# Patient Record
Sex: Female | Born: 1955 | Race: Black or African American | Hispanic: No | Marital: Single | Smoking: Current some day smoker
Health system: Southern US, Community
[De-identification: ages and names within clinical notes are randomized; demographics above are authoritative.]

## PROBLEM LIST (undated history)

## (undated) DIAGNOSIS — M1990 Unspecified osteoarthritis, unspecified site: Secondary | ICD-10-CM

## (undated) DIAGNOSIS — I639 Cerebral infarction, unspecified: Secondary | ICD-10-CM

## (undated) DIAGNOSIS — I10 Essential (primary) hypertension: Secondary | ICD-10-CM

## (undated) DIAGNOSIS — F32A Depression, unspecified: Secondary | ICD-10-CM

## (undated) DIAGNOSIS — E785 Hyperlipidemia, unspecified: Secondary | ICD-10-CM

## (undated) DIAGNOSIS — I509 Heart failure, unspecified: Secondary | ICD-10-CM

## (undated) DIAGNOSIS — K219 Gastro-esophageal reflux disease without esophagitis: Secondary | ICD-10-CM

## (undated) DIAGNOSIS — E119 Type 2 diabetes mellitus without complications: Secondary | ICD-10-CM

## (undated) DIAGNOSIS — I671 Cerebral aneurysm, nonruptured: Secondary | ICD-10-CM

## (undated) DIAGNOSIS — F329 Major depressive disorder, single episode, unspecified: Secondary | ICD-10-CM

## (undated) HISTORY — DX: Type 2 diabetes mellitus without complications: E11.9

## (undated) HISTORY — DX: Hyperlipidemia, unspecified: E78.5

## (undated) HISTORY — DX: Cerebral infarction, unspecified: I63.9

## (undated) HISTORY — PX: ABDOMINAL HYSTERECTOMY: SHX81

## (undated) HISTORY — DX: Essential (primary) hypertension: I10

## (undated) HISTORY — PX: BACK SURGERY: SHX140

---

## 2010-11-15 ENCOUNTER — Encounter: Payer: Self-pay | Admitting: Family Medicine

## 2010-12-04 ENCOUNTER — Encounter: Payer: Self-pay | Admitting: Family Medicine

## 2013-11-14 ENCOUNTER — Ambulatory Visit (INDEPENDENT_AMBULATORY_CARE_PROVIDER_SITE_OTHER): Payer: Medicare Other | Admitting: Otolaryngology

## 2013-11-14 DIAGNOSIS — H903 Sensorineural hearing loss, bilateral: Secondary | ICD-10-CM | POA: Diagnosis not present

## 2013-12-12 ENCOUNTER — Ambulatory Visit (INDEPENDENT_AMBULATORY_CARE_PROVIDER_SITE_OTHER): Payer: Medicare Other | Admitting: Otolaryngology

## 2014-02-14 ENCOUNTER — Ambulatory Visit (HOSPITAL_COMMUNITY)
Admission: RE | Admit: 2014-02-14 | Discharge: 2014-02-14 | Disposition: A | Discharge status: Home / Self Care | Payer: Medicare Other | Source: Ambulatory Visit | Attending: Internal Medicine | Admitting: Internal Medicine

## 2014-02-14 DIAGNOSIS — S31105S Unspecified open wound of abdominal wall, periumbilic region without penetration into peritoneal cavity, sequela: Secondary | ICD-10-CM | POA: Insufficient documentation

## 2014-02-14 DIAGNOSIS — E119 Type 2 diabetes mellitus without complications: Secondary | ICD-10-CM | POA: Insufficient documentation

## 2014-02-14 DIAGNOSIS — T148XXA Other injury of unspecified body region, initial encounter: Secondary | ICD-10-CM

## 2014-02-14 NOTE — Therapy (Signed)
Physical Therapy Evaluation  Patient Details  Name: Jourdan Maldonado MRN: [id number] Date of Birth: [birth year]  Encounter Date: Mar 05, 2014      PT End of Session - 03-05-14 1217    Visit Number 1   Number of Visits 8   Date for PT Re-Evaluation 03/16/14   Authorization Type medicare   Authorization - Visit Number 1   Authorization - Number of Visits 8   PT Start Time 1110   PT Stop Time 1140   PT Time Calculation (min) 30 min   Activity Tolerance Patient tolerated treatment well   Behavior During Therapy [facility] for tasks assessed/performed      No past medical history on file.  No past surgical history on file.  There were no vitals taken for this visit.  Visit Diagnosis:  No diagnosis found.              PT Short Term Goals - 03/05/2014 1218    PT SHORT TERM GOAL #1   Title 0 necrotic tissue   Time 2   Period Weeks   Status New   PT SHORT TERM GOAL #2   Title 100% granulating tissue    Time 2   Period Weeks   Status New   PT SHORT TERM GOAL #3   Title min drainage   Time 2   Period Weeks   Status New          PT Long Term Goals - 2014-03-05 1219    PT LONG TERM GOAL #1   Title wound size to decrease by .5x.5x.5 cm    Time 4   Period Weeks   PT LONG TERM GOAL #2   Title drainage to be scant   Time 4   Period Weeks   Status New   PT LONG TERM GOAL #3   Title Pt to be able to verbalize proper care for wound    Time 4   Period Weeks   Status New          Plan - 03-05-14 1218    PT Frequency 2x / week   PT Duration 4 weeks   PT Next Visit Plan debride and dressing change           G-Codes - 2014/03/05 1220    Functional Assessment Tool Used clinical judgement   Functional Limitation Other PT primary   Other PT Primary Current Status (F0277) At least 60 percent but less than 80 percent impaired, limited or restricted   Other PT Primary Goal Status (A1287) At least 1 percent but less than 20 percent impaired, limited or restricted       Problem List There are no active problems to display for this patient.                     Wound Therapy - 2014/03/05 1200    Subjective Ms. Bassette states that a wound appeared below her belly button the first of October.  It began to drain therefore she went to the MD who felt it was a bug bite.  After the wound continue to get deeper the MD biopsied the site early November.  Since the biopsy the wound is draining more than it was and continues not to heal therefore Ms. Feuerborn has been referred  to PT.   Patient and Family Stated Goals wound to heal    Date of Onset 01/02/14   Prior Treatments self care, biopsy    Pain Assessment 0-10  Pain Score 4    Pain Type Chronic pain   Pain Location Umbilicus   Patients Stated Pain Goal 0   Pain Intervention(s) Emotional support   Evaluation and Treatment Procedures Explained to Patient/Family Yes   Evaluation and Treatment Procedures agreed to   Wound Properties Date First Assessed: 02/14/14 Time First Assessed: 1110 Wound Type: Other (Comment) Location: Abdomen   Dressing Type --  honey alginate followed by ABD pad   Dressing Changed New   Dressing Status None   Dressing Change Frequency --  twice a week   Site / Wound Assessment Yellow   % Wound base Red or Granulating 0%   % Wound base Yellow 100%   % Wound base Black 0%   Peri-wound Assessment Black   Wound Length (cm) 1 cm   Wound Width (cm) 1 cm   Wound Depth (cm) 0.6 cm   Margins Attached edges (approximated)   Closure None   Drainage Amount Moderate  per pt no dressing on wound so difficult to assess   Non-staged Wound Description Full thickness   Treatment Cleansed;Debridement (Selective)   Wound Therapy - Clinical Statement Pt is a 58 yo who has a nonhealing wound from a bed bug bite located at her umbilicus.  The patient is a diabetic and has been referred to PT to promote a more positive healing environment.     Factors Delaying/Impairing Wound Healing  Diabetes Mellitus;Infection - systemic/local   Hydrotherapy Plan Debridement;Dressing change;Patient/family education   Wound Therapy - Frequency --  2 x/ week x 4 weeks    Wound Therapy - Current Recommendations PT   Dressing  honey alginate followed by abd pad    Decrease Necrotic Tissue to 0  2 weeks   Decrease Necrotic Tissue - Progress Goal set today   Increase Granulation Tissue to 100%   Increase Granulation Tissue - Progress Goal set today  2 weeks   Decrease Length/Width/Depth by (cm) .5x.5.Marland Kitchen5  4 weeks   Decrease Length/Width/Depth - Progress Goal set today   Improve Drainage Characteristics --  scant- 4 weeks   Improve Drainage Characteristics - Progress Goal set today   Patient/Family will be able to  verbalize proper care for wound   Patient/Family Instruction Goal - Progress Goal set today   Wound Therapy - Potential for Goals Good            RUSSELL,CINDY PT/CLT 02/14/2014, 12:21 PM

## 2014-02-19 ENCOUNTER — Ambulatory Visit (HOSPITAL_COMMUNITY)
Admission: RE | Admit: 2014-02-19 | Discharge: 2014-02-19 | Disposition: A | Discharge status: Home / Self Care | Payer: Medicare Other | Source: Ambulatory Visit | Attending: Internal Medicine | Admitting: Internal Medicine

## 2014-02-19 DIAGNOSIS — T148XXA Other injury of unspecified body region, initial encounter: Secondary | ICD-10-CM

## 2014-02-19 DIAGNOSIS — S31105S Unspecified open wound of abdominal wall, periumbilic region without penetration into peritoneal cavity, sequela: Secondary | ICD-10-CM | POA: Diagnosis not present

## 2014-02-19 NOTE — Therapy (Signed)
Wound Care Therapy  Patient Details  Name: Rose Greer MRN: [id number] Date of Birth: [birth year]  Encounter Date: 02/19/2014      PT End of Session - 02/19/14 1555    Visit Number 2   Number of Visits 8   Date for PT Re-Evaluation 03/16/14   Authorization Type medicare   Authorization - Visit Number 2   Authorization - Number of Visits 8   PT Start Time 1520   PT Stop Time 1550   PT Time Calculation (min) 30 min   Activity Tolerance Patient tolerated treatment well   Behavior During Therapy [facility] for tasks assessed/performed      No past medical history on file.  No past surgical history on file.  There were no vitals taken for this visit.  Visit Diagnosis:  Nonhealing nonsurgical wound      Problem List There are no active problems to display for this patient.          Wound Therapy - 02/19/14 1543    Subjective Pt states it it itching more than hurting.  States it is tender around the wound.  Pt states she had to change the dressing due to it coming off after last visit.  Dressing intact today.   Patient and Family Stated Goals wound to heal    Date of Onset 01/02/14   Prior Treatments self care, biopsy    Pain Assessment 0-10   Pain Score 2    Pain Type Other (Comment)   Pain Location Umbilicus   Pain Orientation Medial   Pain Descriptors / Indicators Sore;Tender  itching   Pain Onset On-going   Evaluation and Treatment Procedures Explained to Patient/Family --   Evaluation and Treatment Procedures --   Wound Properties Date First Assessed: 02/14/14 Time First Assessed: 1110 Wound Type: Other (Comment) Location: Abdomen   Dressing Type Adhesive bandage  honey alginate, 2X2, ABD and medipore tape   Dressing Changed New   Dressing Status Clean;Dry;Intact   Dressing Change Frequency --  twice a week   Site / Wound Assessment Yellow;Pink;Red   % Wound base Red or Granulating 20%   % Wound base Yellow 80%   % Wound base Black 0%   Peri-wound  Assessment Intact   Margins Attached edges (approximated)   Closure None   Drainage Amount Minimal   Drainage Description Serous   Non-staged Wound Description Not applicable   Treatment Cleansed;Debridement (Selective)   Wound Therapy - Clinical Statement Pt with dressing intact with minimal drainage on bandaging.  Able to debride slough from edges using scapel.  Increased granulation tissue to 20% today.  Noted blistery area to inferior left of wound possibly from moisture around wound.  Continue to monitor.  No signs/symptoms of infection.  Continued with previous dressing of medihoney alginate.   Factors Delaying/Impairing Wound Healing --   Hydrotherapy Plan --   Wound Therapy - Frequency --  2 x/ week x 4 weeks    Wound Therapy - Current Recommendations PT   Wound Plan Continue irrigation, debridement and dressing changes using appropriate bandaging.   Dressing  honey alginate, 2X2, ABD, medipore tape   Decrease Necrotic Tissue to 0  2 weeks   Decrease Necrotic Tissue - Progress Progressing toward goal   Increase Granulation Tissue to 100%  2 weeks   Increase Granulation Tissue - Progress Progressing toward goal   Decrease Length/Width/Depth by (cm) .5x.5.Marland Kitchen5  4 weeks   Decrease Length/Width/Depth - Progress Progressing toward goal   Improve  Drainage Characteristics Other (comment)  scant- 4 weeks   Improve Drainage Characteristics - Progress Progressing toward goal   Patient/Family will be able to  verbalize proper care for wound  4 weeks   Patient/Family Instruction Goal - Progress Progressing toward goal   Wound Therapy - Potential for Goals Glendon Axe, PTA/CLT [phone] 02/19/2014, 4:00 PM

## 2014-02-21 ENCOUNTER — Ambulatory Visit (HOSPITAL_COMMUNITY)
Admission: RE | Admit: 2014-02-21 | Discharge: 2014-02-21 | Disposition: A | Discharge status: Home / Self Care | Payer: Medicare Other | Source: Ambulatory Visit | Attending: Internal Medicine | Admitting: Internal Medicine

## 2014-02-21 DIAGNOSIS — T148XXA Other injury of unspecified body region, initial encounter: Secondary | ICD-10-CM

## 2014-02-21 DIAGNOSIS — S31105S Unspecified open wound of abdominal wall, periumbilic region without penetration into peritoneal cavity, sequela: Secondary | ICD-10-CM | POA: Diagnosis not present

## 2014-02-21 NOTE — Therapy (Signed)
Wound Care Therapy  Patient Details  Name: Rose Greer MRN: [id number] Date of Birth: Sep 02, 1955  Encounter Date: 02/21/2014      PT End of Session - 02/21/14 1351    Visit Number 3   Number of Visits 8   Date for PT Re-Evaluation 03/16/14   Authorization Type medicare   Authorization - Visit Number 3   Authorization - Number of Visits 8   PT Start Time 4496   PT Stop Time 1332   PT Time Calculation (min) 20 min   Activity Tolerance Patient tolerated treatment well   Behavior During Therapy [facility] for tasks assessed/performed      No past medical history on file.  No past surgical history on file.  There were no vitals taken for this visit.  Visit Diagnosis:  Nonhealing nonsurgical wound       PT Short Term Goals - 02/21/14 1351    PT SHORT TERM GOAL #1   Title 0 necrotic tissue   Status On-going   PT SHORT TERM GOAL #2   Title 100% granulating tissue    Status On-going   PT SHORT TERM GOAL #3   Title min drainage   Status On-going          PT Long Term Goals - 02/21/14 1351    PT LONG TERM GOAL #1   Title wound size to decrease by .5x.5x.5 cm    Status On-going   PT LONG TERM GOAL #2   Title drainage to be scant   PT LONG TERM GOAL #3   Title Pt to be able to verbalize proper care for wound        Problem List There are no active problems to display for this patient.        Wound Therapy - 02/21/14 1342    Subjective Dressings intact, pt placed bandaid over metapore tape for stability.  No real pain just itching.   Patient and Family Stated Goals wound to heal    Date of Onset 01/02/14   Prior Treatments self care, biopsy    Pain Assessment 0-10   Pain Score 0-No pain   Pain Location Umbilicus   Pain Orientation Medial   Pain Descriptors / Indicators Sore   Pain Onset On-going   Wound Properties Date First Assessed: 02/14/14 Time First Assessed: 1110 Wound Type: Other (Comment) Location: Abdomen   Dressing Type Adhesive bandage   Dressing Changed New   Dressing Status Clean;Dry;Intact   Site / Wound Assessment Yellow;Pink;Red   % Wound base Red or Granulating 35%   % Wound base Yellow 65%   % Wound base Black 0%   Peri-wound Assessment Intact   Margins Attached edges (approximated)   Closure None   Drainage Amount Minimal   Drainage Description Serous   Non-staged Wound Description Not applicable   Treatment Cleansed;Debridement (Selective)   Wound Therapy - Clinical Statement Dressings intact with minmal drainage noted.  Able to complete debridement for removal of slough around edges and base of wound.  Noted 4 small blisters interior to wound believed to be from drainage previously.  No selective debridement complete to area, continue to monitor.     Factors Delaying/Impairing Wound Healing Diabetes Mellitus;Infection - systemic/local   Hydrotherapy Plan Debridement;Dressing change;Patient/family education   Wound Therapy - Current Recommendations PT   Wound Plan Continue irrigation, debridement and dressing changes using appropriate bandaging.   Dressing  honey alginate, 2X2, ABD, medipore tape     Rose Greer, [place]  Aldona Lento 02/21/2014, 1:52 PM

## 2014-02-24 ENCOUNTER — Ambulatory Visit (HOSPITAL_COMMUNITY)
Admission: RE | Admit: 2014-02-24 | Discharge: 2014-02-24 | Disposition: A | Discharge status: Home / Self Care | Payer: Medicare Other | Source: Ambulatory Visit | Attending: Internal Medicine | Admitting: Internal Medicine

## 2014-02-24 DIAGNOSIS — T148XXA Other injury of unspecified body region, initial encounter: Secondary | ICD-10-CM

## 2014-02-24 DIAGNOSIS — S31105S Unspecified open wound of abdominal wall, periumbilic region without penetration into peritoneal cavity, sequela: Secondary | ICD-10-CM | POA: Diagnosis not present

## 2014-02-24 NOTE — Therapy (Signed)
Wound Care Therapy  Patient Details  Name: Rose Greer MRN: [id number] Date of Birth: 08/29/1955  Encounter Date: 02/24/2014      PT End of Session - 02/24/14 1251    Visit Number 4   Number of Visits 8   Date for PT Re-Evaluation 03/16/14   Authorization Type medicare   Authorization - Visit Number 4   Authorization - Number of Visits 8   PT Start Time 1210   PT Stop Time 1230   PT Time Calculation (min) 20 min   Activity Tolerance Patient tolerated treatment well   Behavior During Therapy [facility] for tasks assessed/performed      No past medical history on file.  No past surgical history on file.  There were no vitals taken for this visit.  Visit Diagnosis:  Nonhealing nonsurgical wound   Problem List There are no active problems to display for this patient.        Wound Therapy - 02/24/14 1239    Subjective dressings intact today.  Pt reports no pain, only tenderness.   Patient and Family Stated Goals wound to heal    Date of Onset 01/02/14   Prior Treatments self care, biopsy    Pain Assessment No/denies pain   Pain Score 0-No pain   Pain Location Umbilicus   Pain Orientation Medial   Pain Descriptors / Indicators Sore   Pain Onset On-going   Wound Properties Date First Assessed: 02/14/14 Time First Assessed: 1110 Wound Type: Other (Comment) Location: Abdomen   Dressing Type Adhesive bandage   Dressing Changed Changed   Dressing Status Clean;Dry;Intact   Site / Wound Assessment Yellow;Pink;Red   % Wound base Red or Granulating 50%   % Wound base Yellow 50%   % Wound base Black 0%   Peri-wound Assessment Intact   Wound Length (cm) 0.5 cm   Wound Width (cm) 0.8 cm   Wound Depth (cm) 0.2 cm   Margins Attached edges (approximated)   Closure None   Drainage Amount Minimal   Drainage Description Serous   Non-staged Wound Description Not applicable   Treatment Cleansed;Debridement (Selective)   Selective Debridement - Location woundbed and edges of  wound   Selective Debridement - Tools Used Forceps   Selective Debridement - Tissue Removed slough   Wound Therapy - Clinical Statement Wound overall decreasing in size with minimal drainage noted.  Removed slough from wound bed and edges of wounds to increase approximation.  Continued with medihoney dressings with medipore tape.    Factors Delaying/Impairing Wound Healing Diabetes Mellitus;Infection - systemic/local   Hydrotherapy Plan Debridement;Dressing change;Patient/family education   Wound Therapy - Current Recommendations PT   Wound Plan Continue irrigation, debridement and dressing changes using appropriate bandaging.   Dressing  honey alginate, 2X2, ABD, medipore tape   Decrease Necrotic Tissue to 0   Decrease Necrotic Tissue - Progress Progressing toward goal   Increase Granulation Tissue to 100%   Increase Granulation Tissue - Progress Progressing toward goal   Decrease Length/Width/Depth by (cm) .5x.5.Marland Kitchen5   Decrease Length/Width/Depth - Progress Progressing toward goal   Improve Drainage Characteristics Other (comment)  scant   Improve Drainage Characteristics - Progress Progressing toward goal   Patient/Family will be able to  verbalize proper care for wound   Patient/Family Instruction Goal - Progress Progressing toward goal        Teena Irani, PTA/CLT [phone] 02/24/2014, 12:52 PM

## 2014-02-26 ENCOUNTER — Ambulatory Visit (HOSPITAL_COMMUNITY): Payer: Medicare Other | Admitting: Physical Therapy

## 2014-03-04 ENCOUNTER — Ambulatory Visit (HOSPITAL_COMMUNITY)
Admission: RE | Admit: 2014-03-04 | Discharge: 2014-03-04 | Disposition: A | Discharge status: Home / Self Care | Payer: Medicare Other | Source: Ambulatory Visit | Attending: Internal Medicine | Admitting: Internal Medicine

## 2014-03-04 DIAGNOSIS — E119 Type 2 diabetes mellitus without complications: Secondary | ICD-10-CM | POA: Insufficient documentation

## 2014-03-04 DIAGNOSIS — Z7189 Other specified counseling: Secondary | ICD-10-CM | POA: Diagnosis not present

## 2014-03-04 DIAGNOSIS — S31105D Unspecified open wound of abdominal wall, periumbilic region without penetration into peritoneal cavity, subsequent encounter: Secondary | ICD-10-CM | POA: Diagnosis present

## 2014-03-04 DIAGNOSIS — T148XXA Other injury of unspecified body region, initial encounter: Secondary | ICD-10-CM

## 2014-03-04 NOTE — Therapy (Signed)
Wound Care Therapy  Patient Details  Name: Kateria Cutrona MRN: [id number] Date of Birth: 07/12/1955  Encounter Date: 03/04/2014      PT End of Session - 03/04/14 1149    Visit Number 5   Number of Visits 8   Date for PT Re-Evaluation 03/16/14   Authorization Type medicare   Authorization - Visit Number 5   Authorization - Number of Visits 8   PT Start Time 4970   PT Stop Time 1145   PT Time Calculation (min) 20 min   Activity Tolerance Patient tolerated treatment well   Behavior During Therapy [facility] for tasks assessed/performed      No past medical history on file.  No past surgical history on file.  There were no vitals taken for this visit.  Visit Diagnosis:  Nonhealing nonsurgical wound         Wound Therapy - 03/04/14 1143    Subjective dressings intact today.  Pt reports no pain   Patient and Family Stated Goals wound to heal    Date of Onset 01/02/14   Prior Treatments self care, biopsy    Pain Assessment No/denies pain   Pain Score 0-No pain   Pain Location Umbilicus   Pain Orientation Medial   Pain Descriptors / Indicators Sore   Wound Properties Date First Assessed: 02/14/14 Time First Assessed: 1110 Wound Type: Other (Comment) Location: Abdomen   Dressing Type Adhesive bandage  medihoney alginate   Dressing Changed Changed   Dressing Status Clean;Dry;Intact   Site / Wound Assessment Yellow;Pink;Red   % Wound base Red or Granulating 75%   % Wound base Yellow 25%   % Wound base Black 0%   Peri-wound Assessment Intact   Wound Length (cm) 0.3 cm   Wound Width (cm) 0.6 cm   Wound Depth (cm) 0.1 cm   Margins Attached edges (approximated)   Closure None   Drainage Amount Minimal   Drainage Description Serous   Non-staged Wound Description Not applicable   Treatment Cleansed;Debridement (Selective)   Selective Debridement - Location woundbed and edges of wound   Selective Debridement - Tools Used Forceps   Selective Debridement - Tissue Removed  slough   Wound Therapy - Clinical Statement Measured again today; pt had missed last appt due to being late.  Pt was also late for today's appt.  Dressings intact with overall decrease in size and increase in granulation.  Removed all sticky residue from perimeter of wound.  Anticpate complete healing of wound by next week.     Factors Delaying/Impairing Wound Healing Diabetes Mellitus;Infection - systemic/local   Hydrotherapy Plan Debridement;Dressing change;Patient/family education   Wound Therapy - Current Recommendations PT   Wound Plan Continue irrigation, debridement and dressing changes using appropriate bandaging.   Dressing  honey alginate, 2X2, ABD, medipore tape   Decrease Necrotic Tissue to 0   Decrease Necrotic Tissue - Progress Progressing toward goal   Increase Granulation Tissue to 100%   Increase Granulation Tissue - Progress Progressing toward goal   Decrease Length/Width/Depth by (cm) .5x.5.Marland Kitchen5   Decrease Length/Width/Depth - Progress Progressing toward goal   Improve Drainage Characteristics Other (comment)  scant   Improve Drainage Characteristics - Progress Progressing toward goal   Patient/Family will be able to  verbalize proper care for wound   Patient/Family Instruction Goal - Progress Progressing toward goal     Problem List There are no active problems to display for this patient.       Teena Irani, PTA/CLT [phone]  03/04/2014, 11:50 AM

## 2014-03-06 ENCOUNTER — Ambulatory Visit (HOSPITAL_COMMUNITY)
Admission: RE | Admit: 2014-03-06 | Discharge: 2014-03-06 | Disposition: A | Discharge status: Home / Self Care | Payer: Medicare Other | Source: Ambulatory Visit | Attending: Internal Medicine | Admitting: Internal Medicine

## 2014-03-06 DIAGNOSIS — S31105D Unspecified open wound of abdominal wall, periumbilic region without penetration into peritoneal cavity, subsequent encounter: Secondary | ICD-10-CM | POA: Diagnosis not present

## 2014-03-06 DIAGNOSIS — T148XXA Other injury of unspecified body region, initial encounter: Secondary | ICD-10-CM

## 2014-03-06 NOTE — Therapy (Signed)
[facility] [place], [place], [address] Phone: [phone]   Fax:  [phone]  Wound Care Therapy  Patient Details  Name: Rose Greer MRN: [id number] Date of Birth: 1955-07-30  Encounter Date: 03-08-14  Plan: Patient agrees to discharge.  Patient goals were partially met. Patient is being discharged due to meeting the stated rehab goals.  ?????          PT End of Session - Mar 08, 2014 1146    Visit Number 6   Number of Visits 6   Authorization Type medicare   Authorization - Visit Number 6   Authorization - Number of Visits 6   PT Start Time 1110   PT Stop Time 1130   PT Time Calculation (min) 20 min   Activity Tolerance Patient tolerated treatment well   Behavior During Therapy [facility] for tasks assessed/performed      No past medical history on file.  No past surgical history on file.  There were no vitals taken for this visit.  Visit Diagnosis:  Nonhealing nonsurgical wound      Subjective Assessment - 03/08/2014 1139    Symptoms Pt states no pain   Currently in Pain? No/denies   Pain Score 0-No pain           Wound Therapy - 03-08-14 1139    Subjective Pt states no pain. She has kept the dressing on since last visit    Patient and Family Stated Goals wound to heal    Date of Onset 01/02/14   Prior Treatments self care, biopsy    Pain Assessment No/denies pain   Wound Properties Date First Assessed: 02/14/14 Time First Assessed: 1110 Wound Type: Other (Comment) Location: Abdomen   % Wound base Red or Granulating 100%   % Wound base Yellow 0%   Peri-wound Assessment Intact   Wound Length (cm) 0.3 cm   Wound Width (cm) 0.2 cm   Wound Depth (cm) 0 cm   Margins Attached edges (approximated)   Closure None   Drainage Amount Scant   Treatment Cleansed;Other (Comment)  self care.   Wound Therapy - Clinical Statement Pt wound has continued to decrease in size.  Pt verbalized ability for self care at this point.      Factors Delaying/Impairing Wound Healing Diabetes Mellitus;Infection - systemic/local   Hydrotherapy Plan --  discharge to self care   Wound Therapy - Current Recommendations Other (comment)  discharge   Wound Plan discharge patient to self care as pt no longer is in need of skilled care to take care of her wound.    Dressing  none    Decrease Necrotic Tissue to 0   Decrease Necrotic Tissue - Progress Met   Increase Granulation Tissue to 100%   Increase Granulation Tissue - Progress Met   Decrease Length/Width/Depth by (cm) .5x.5x.5   Decrease Length/Width/Depth - Progress Met   Improve Drainage Characteristics --  scant   Improve Drainage Characteristics - Progress Met   Patient/Family will be able to  verbalize proper care for wound   Patient/Family Instruction Goal - Progress Met            G-Codes - 03/08/14 1147    Functional Assessment Tool Used clinical judgement   Functional Limitation Other PT primary   Other PT Primary Goal Status (A6301) At least 1 percent but less than 20 percent impaired, limited or restricted   Other PT Primary Discharge Status (S0109) At least 1 percent but less than  20 percent impaired, limited or restricted        Problem List There are no active problems to display for this patient.  Azucena Freed PT/CLT [phone]  03/06/2014, 11:48 AM

## 2014-03-11 ENCOUNTER — Ambulatory Visit (HOSPITAL_COMMUNITY): Payer: Medicare Other | Admitting: Physical Therapy

## 2014-03-14 ENCOUNTER — Ambulatory Visit (HOSPITAL_COMMUNITY): Payer: Medicare Other | Admitting: Physical Therapy

## 2015-03-27 ENCOUNTER — Encounter (HOSPITAL_COMMUNITY): Payer: Self-pay | Admitting: Emergency Medicine

## 2015-03-27 ENCOUNTER — Emergency Department (HOSPITAL_COMMUNITY)
Admission: EM | Admit: 2015-03-27 | Discharge: 2015-03-27 | Disposition: A | Discharge status: Home / Self Care | Payer: Medicare Other | Attending: Emergency Medicine | Admitting: Emergency Medicine

## 2015-03-27 DIAGNOSIS — T310 Burns involving less than 10% of body surface: Secondary | ICD-10-CM

## 2015-03-27 DIAGNOSIS — F17210 Nicotine dependence, cigarettes, uncomplicated: Secondary | ICD-10-CM | POA: Insufficient documentation

## 2015-03-27 DIAGNOSIS — I10 Essential (primary) hypertension: Secondary | ICD-10-CM | POA: Insufficient documentation

## 2015-03-27 DIAGNOSIS — Y9389 Activity, other specified: Secondary | ICD-10-CM | POA: Insufficient documentation

## 2015-03-27 DIAGNOSIS — Y998 Other external cause status: Secondary | ICD-10-CM | POA: Insufficient documentation

## 2015-03-27 DIAGNOSIS — Z8719 Personal history of other diseases of the digestive system: Secondary | ICD-10-CM | POA: Insufficient documentation

## 2015-03-27 DIAGNOSIS — Y9289 Other specified places as the place of occurrence of the external cause: Secondary | ICD-10-CM | POA: Insufficient documentation

## 2015-03-27 DIAGNOSIS — T23222A Burn of second degree of single left finger (nail) except thumb, initial encounter: Secondary | ICD-10-CM | POA: Insufficient documentation

## 2015-03-27 DIAGNOSIS — X12XXXA Contact with other hot fluids, initial encounter: Secondary | ICD-10-CM | POA: Diagnosis not present

## 2015-03-27 DIAGNOSIS — T23221A Burn of second degree of single right finger (nail) except thumb, initial encounter: Secondary | ICD-10-CM | POA: Insufficient documentation

## 2015-03-27 DIAGNOSIS — Z880 Allergy status to penicillin: Secondary | ICD-10-CM | POA: Insufficient documentation

## 2015-03-27 DIAGNOSIS — E119 Type 2 diabetes mellitus without complications: Secondary | ICD-10-CM | POA: Diagnosis not present

## 2015-03-27 DIAGNOSIS — T23021A Burn of unspecified degree of single right finger (nail) except thumb, initial encounter: Secondary | ICD-10-CM | POA: Diagnosis present

## 2015-03-27 HISTORY — DX: Cerebral aneurysm, nonruptured: I67.1

## 2015-03-27 HISTORY — DX: Gastro-esophageal reflux disease without esophagitis: K21.9

## 2015-03-27 MED ORDER — SILVER SULFADIAZINE 1 % EX CREA
TOPICAL_CREAM | Freq: Once | CUTANEOUS | Status: DC
  Filled 2015-03-27: qty 50
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
## 2016-06-24 ENCOUNTER — Emergency Department (HOSPITAL_COMMUNITY)
Admission: EM | Admit: 2016-06-24 | Discharge: 2016-06-24 | Disposition: A | Discharge status: Home / Self Care | Payer: Medicare Other | Attending: Emergency Medicine | Admitting: Emergency Medicine

## 2016-06-24 ENCOUNTER — Encounter (HOSPITAL_COMMUNITY): Payer: Self-pay | Admitting: Emergency Medicine

## 2016-06-24 DIAGNOSIS — L089 Local infection of the skin and subcutaneous tissue, unspecified: Secondary | ICD-10-CM | POA: Diagnosis not present

## 2016-06-24 DIAGNOSIS — F17210 Nicotine dependence, cigarettes, uncomplicated: Secondary | ICD-10-CM | POA: Diagnosis not present

## 2016-06-24 DIAGNOSIS — E119 Type 2 diabetes mellitus without complications: Secondary | ICD-10-CM | POA: Diagnosis not present

## 2016-06-24 DIAGNOSIS — R220 Localized swelling, mass and lump, head: Secondary | ICD-10-CM | POA: Diagnosis present

## 2016-06-24 DIAGNOSIS — I10 Essential (primary) hypertension: Secondary | ICD-10-CM | POA: Diagnosis not present

## 2016-06-24 MED ORDER — CLINDAMYCIN HCL 300 MG PO CAPS: 300.0000 mg | ORAL_CAPSULE | Freq: Four times a day (QID) | ORAL | 0 refills | Status: DC

## 2016-06-24 NOTE — Discharge Instructions (Signed)
Read the information below.  Use the prescribed medication as directed.  Please discuss all new medications with your pharmacist.  You may return to the Emergency Department at any time for worsening condition or any new symptoms that concern you.   If you develop redness, increased swelling, pus draining from the wound, increased pain, or fevers greater than 100.4, return to the ER immediately for a recheck.

## 2016-06-24 NOTE — ED Notes (Signed)
Rose Sarna, pa notified of hypertension. Pt denies cp, sob, or headache, emily informed.  Pt reports she has not taken bp medication today and is currently taking dose at discharge. Pt verbalizes understanding of taking bp medication.

## 2016-06-24 NOTE — ED Provider Notes (Signed)
[facility] DEPT Provider Note   CSN: [id number] Arrival date & time: 06/24/16  1427     History   Chief Complaint Chief Complaint  Patient presents with  . Abscess    HPI Rose Greer is a 61 y.o. female.  HPI   Pt presents with swelling in her left lower lip and left cheek that began yesterday.  States she was eating a large sandwich when the corner of her lip split.  Progressively she noticed swelling around the area and this morning it had worsened.  She notes a knot in her left cheek.  Has had orange discharge from the split in her lip.  Denies fevers, chills, myalgias, sore throat, difficulty swallowing or breathing, gingival pain (pt has no teeth in that area).  She is diabetic, blood sugars have been "up and down."    Past Medical History:  Diagnosis Date  . Brain aneurysm   . Diabetes mellitus without complication ([place])   . GERD (gastroesophageal reflux disease)   . High cholesterol   . Hypertension     There are no active problems to display for this patient.   Past Surgical History:  Procedure Laterality Date  . BACK SURGERY      OB History    No data available       Home Medications    Prior to Admission medications   Medication Sig Start Date End Date Taking? Authorizing Provider  clindamycin (CLEOCIN) 300 MG capsule Take 1 capsule (300 mg total) by mouth 4 (four) times daily. X 7 days 06/24/16   Clayton Bibles, PA-C    Family History No family history on file.  Social History Social History  Substance Use Topics  . Smoking status: Current Some Day Smoker    Packs/day: 0.25    Types: Cigarettes  . Smokeless tobacco: Never Used  . Alcohol use No     Allergies   Benazepril and Penicillins   Review of Systems Review of Systems  Constitutional: Negative for chills and fever.  HENT: Positive for facial swelling. Negative for sore throat and trouble swallowing.   Respiratory: Negative for cough, choking and shortness of breath.     Musculoskeletal: Negative for neck pain and neck stiffness.  Skin: Negative for color change and wound.  Allergic/Immunologic: Positive for immunocompromised state (diabetic ).  Psychiatric/Behavioral: Negative for self-injury.     Physical Exam Updated Vital Signs BP (!) 194/88   Pulse 80   Temp 98.8 F (37.1 C) (Oral)   Resp 16   Ht 5\' 3"  (1.6 m)   Wt 67.1 kg   SpO2 100%   BMI 26.22 kg/m   Physical Exam  Constitutional: She appears well-developed and well-nourished. No distress.  HENT:  Head: Normocephalic and atraumatic.    Mouth/Throat: Uvula is midline, oropharynx is clear and moist and mucous membranes are normal. No oropharyngeal exudate, posterior oropharyngeal edema, posterior oropharyngeal erythema or tonsillar abscesses.  Split and left corner of lips, left lower lip, small swelling lateral to lips.  Tiny area of possible induration.  No fluctuance.  No overlying erythema.  No active discharge from split.    Left lower mouth without tenderness, erythema, or edema of the gingiva.  Left lower area of mouth is edentulous.    Eyes: Conjunctivae are normal.  Neck: Normal range of motion. Neck supple.  Cardiovascular: Normal rate and regular rhythm.   Pulmonary/Chest: Effort normal and breath sounds normal. No stridor. No respiratory distress. She has no wheezes. She has no  rales.  Lymphadenopathy:    She has no cervical adenopathy.  Neurological: She is alert.  Skin: She is not diaphoretic.  Nursing note and vitals reviewed.    ED Treatments / Results  Labs (all labs ordered are listed, but only abnormal results are displayed) Labs Reviewed - No data to display  EKG  EKG Interpretation None       Radiology No results found.  Procedures Procedures (including critical care time)  Medications Ordered in ED Medications - No data to display   Initial Impression / Assessment and Plan / ED Course  I have reviewed the triage vital signs and the nursing  notes.  Pertinent labs & imaging results that were available during my care of the patient were reviewed by me and considered in my medical decision making (see chart for details).   Afebrile, nontoxic patient with apparent facial infection with entry point at lip split while eating large sandwich.  No drainable abscess at present.  Pt advised to monitor and control blood sugars well, take prescribed antibiotics, use warm moist compresses.  Discussed return precautions and the possible need for I&D in future.   D/C home with clindamycin.   Discussed result, findings, treatment, and follow up  with patient.  Pt given return precautions.  Pt verbalizes understanding and agrees with plan.       Final Clinical Impressions(s) / ED Diagnoses   Final diagnoses:  Facial infection    New Prescriptions Discharge Medication List as of 06/24/2016  3:27 PM    START taking these medications   Details  clindamycin (CLEOCIN) 300 MG capsule Take 1 capsule (300 mg total) by mouth 4 (four) times daily. X 7 days, Starting Fri 06/24/2016, Print         [place], [place] 06/24/16 [place], MD 06/25/16 [phone]

## 2016-06-24 NOTE — ED Triage Notes (Signed)
Swollen knot to lt lower jaw area, started yesterday, has gotten worse today.

## 2016-06-24 NOTE — ED Notes (Signed)
Pt made aware to return if symptoms worsen or if any life threatening symptoms occur.   

## 2016-07-07 ENCOUNTER — Encounter (HOSPITAL_COMMUNITY): Payer: Self-pay | Admitting: Cardiology

## 2016-07-07 ENCOUNTER — Inpatient Hospital Stay (HOSPITAL_COMMUNITY)
Admission: EM | Admit: 2016-07-07 | Discharge: 2016-07-09 | DRG: 291 | Disposition: A | Discharge status: Home / Self Care | Payer: Medicare Other | Attending: Internal Medicine | Admitting: Internal Medicine

## 2016-07-07 ENCOUNTER — Emergency Department (HOSPITAL_COMMUNITY): Payer: Medicare Other

## 2016-07-07 DIAGNOSIS — Z794 Long term (current) use of insulin: Secondary | ICD-10-CM

## 2016-07-07 DIAGNOSIS — E119 Type 2 diabetes mellitus without complications: Secondary | ICD-10-CM

## 2016-07-07 DIAGNOSIS — I160 Hypertensive urgency: Secondary | ICD-10-CM

## 2016-07-07 DIAGNOSIS — F17210 Nicotine dependence, cigarettes, uncomplicated: Secondary | ICD-10-CM | POA: Diagnosis present

## 2016-07-07 DIAGNOSIS — I5021 Acute systolic (congestive) heart failure: Secondary | ICD-10-CM | POA: Diagnosis not present

## 2016-07-07 DIAGNOSIS — J9601 Acute respiratory failure with hypoxia: Secondary | ICD-10-CM | POA: Diagnosis present

## 2016-07-07 DIAGNOSIS — E7800 Pure hypercholesterolemia, unspecified: Secondary | ICD-10-CM | POA: Diagnosis present

## 2016-07-07 DIAGNOSIS — Z79899 Other long term (current) drug therapy: Secondary | ICD-10-CM

## 2016-07-07 DIAGNOSIS — I161 Hypertensive emergency: Secondary | ICD-10-CM | POA: Diagnosis present

## 2016-07-07 DIAGNOSIS — J810 Acute pulmonary edema: Secondary | ICD-10-CM | POA: Diagnosis not present

## 2016-07-07 DIAGNOSIS — J811 Chronic pulmonary edema: Secondary | ICD-10-CM

## 2016-07-07 DIAGNOSIS — I429 Cardiomyopathy, unspecified: Secondary | ICD-10-CM | POA: Diagnosis present

## 2016-07-07 DIAGNOSIS — Z888 Allergy status to other drugs, medicaments and biological substances status: Secondary | ICD-10-CM

## 2016-07-07 DIAGNOSIS — E785 Hyperlipidemia, unspecified: Secondary | ICD-10-CM | POA: Diagnosis present

## 2016-07-07 DIAGNOSIS — Z880 Allergy status to penicillin: Secondary | ICD-10-CM

## 2016-07-07 DIAGNOSIS — Z23 Encounter for immunization: Secondary | ICD-10-CM

## 2016-07-07 DIAGNOSIS — I110 Hypertensive heart disease with heart failure: Secondary | ICD-10-CM | POA: Diagnosis not present

## 2016-07-07 DIAGNOSIS — M25512 Pain in left shoulder: Secondary | ICD-10-CM | POA: Diagnosis present

## 2016-07-07 DIAGNOSIS — I5033 Acute on chronic diastolic (congestive) heart failure: Secondary | ICD-10-CM | POA: Diagnosis present

## 2016-07-07 DIAGNOSIS — K219 Gastro-esophageal reflux disease without esophagitis: Secondary | ICD-10-CM | POA: Diagnosis present

## 2016-07-07 DIAGNOSIS — J40 Bronchitis, not specified as acute or chronic: Secondary | ICD-10-CM | POA: Diagnosis present

## 2016-07-07 LAB — CBC WITH DIFFERENTIAL/PLATELET
BASOS PCT: 0 %
Basophils Absolute: 0.1 10*3/uL (ref 0.0–0.1)
Eosinophils Absolute: 0.2 10*3/uL (ref 0.0–0.7)
Eosinophils Relative: 2 %
HCT: 44.3 % (ref 36.0–46.0)
Hemoglobin: 14.7 g/dL (ref 12.0–15.0)
LYMPHS PCT: 47 %
Lymphs Abs: 6.1 10*3/uL — ABNORMAL HIGH (ref 0.7–4.0)
MCH: 28.8 pg (ref 26.0–34.0)
MCHC: 33.2 g/dL (ref 30.0–36.0)
MCV: 86.7 fL (ref 78.0–100.0)
MONO ABS: 1 10*3/uL (ref 0.1–1.0)
Monocytes Relative: 7 %
NEUTROS ABS: 5.8 10*3/uL (ref 1.7–7.7)
NEUTROS PCT: 44 %
PLATELETS: 224 10*3/uL (ref 150–400)
RBC: 5.11 MIL/uL (ref 3.87–5.11)
RDW: 14.6 % (ref 11.5–15.5)
WBC: 13 10*3/uL — ABNORMAL HIGH (ref 4.0–10.5)

## 2016-07-07 LAB — BASIC METABOLIC PANEL
Anion gap: 13 (ref 5–15)
BUN: 6 mg/dL (ref 6–20)
CO2: 22 mmol/L (ref 22–32)
Calcium: 9.4 mg/dL (ref 8.9–10.3)
Chloride: 101 mmol/L (ref 101–111)
Creatinine, Ser: 0.77 mg/dL (ref 0.44–1.00)
GFR calc Af Amer: >60 mL/min (ref >60)
GLUCOSE: 117 mg/dL — AB (ref 65–99)
Potassium: 3.1 mmol/L — ABNORMAL LOW (ref 3.5–5.1)
Sodium: 136 mmol/L (ref 135–145)

## 2016-07-07 LAB — TROPONIN I
Troponin I: <0.03 ng/mL (ref <0.03)
Troponin I: <0.03 ng/mL (ref <0.03)

## 2016-07-07 LAB — BRAIN NATRIURETIC PEPTIDE: B Natriuretic Peptide: 109 pg/mL — ABNORMAL HIGH (ref 0.0–100.0)

## 2016-07-07 LAB — GLUCOSE, CAPILLARY: GLUCOSE-CAPILLARY: 138 mg/dL — AB (ref 65–99)

## 2016-07-07 MED ORDER — ENOXAPARIN SODIUM 40 MG/0.4ML ~~LOC~~ SOLN
40.0000 mg | SUBCUTANEOUS | Status: DC
  Administered 2016-07-07 – 2016-07-08 (×2): 40 mg via SUBCUTANEOUS
  Filled 2016-07-07 (×2): qty 0.4

## 2016-07-07 MED ORDER — POTASSIUM CHLORIDE 10 MEQ/100ML IV SOLN
10.0000 meq | INTRAVENOUS | Status: AC
  Administered 2016-07-07 – 2016-07-08 (×3): 10 meq via INTRAVENOUS
  Filled 2016-07-07 (×3): qty 100

## 2016-07-07 MED ORDER — ONDANSETRON HCL 4 MG PO TABS: 4.0000 mg | ORAL_TABLET | Freq: Four times a day (QID) | ORAL | Status: DC | PRN

## 2016-07-07 MED ORDER — METOPROLOL TARTRATE 25 MG PO TABS
25.0000 mg | ORAL_TABLET | Freq: Once | ORAL | Status: AC
  Administered 2016-07-07: 25 mg via ORAL
  Filled 2016-07-07: qty 1

## 2016-07-07 MED ORDER — ACETAMINOPHEN 325 MG PO TABS
650.0000 mg | ORAL_TABLET | Freq: Four times a day (QID) | ORAL | Status: DC | PRN
  Administered 2016-07-08: 650 mg via ORAL
  Filled 2016-07-07: qty 2

## 2016-07-07 MED ORDER — HYDRALAZINE HCL 25 MG PO TABS
25.0000 mg | ORAL_TABLET | Freq: Once | ORAL | Status: AC
  Administered 2016-07-07: 25 mg via ORAL
  Filled 2016-07-07: qty 1

## 2016-07-07 MED ORDER — FUROSEMIDE 10 MG/ML IJ SOLN
20.0000 mg | Freq: Once | INTRAMUSCULAR | Status: AC
  Administered 2016-07-07: 20 mg via INTRAVENOUS
  Filled 2016-07-07: qty 2

## 2016-07-07 MED ORDER — FAMOTIDINE 20 MG PO TABS
20.0000 mg | ORAL_TABLET | Freq: Two times a day (BID) | ORAL | Status: DC
  Administered 2016-07-07 – 2016-07-09 (×4): 20 mg via ORAL
  Filled 2016-07-07 (×4): qty 1

## 2016-07-07 MED ORDER — ACETAMINOPHEN 650 MG RE SUPP: 650.0000 mg | Freq: Four times a day (QID) | RECTAL | Status: DC | PRN

## 2016-07-07 MED ORDER — INSULIN ASPART 100 UNIT/ML ~~LOC~~ SOLN
0.0000 [IU] | Freq: Three times a day (TID) | SUBCUTANEOUS | Status: DC
  Administered 2016-07-08: 2 [IU] via SUBCUTANEOUS
  Administered 2016-07-08: 3 [IU] via SUBCUTANEOUS

## 2016-07-07 MED ORDER — FUROSEMIDE 10 MG/ML IJ SOLN
20.0000 mg | Freq: Two times a day (BID) | INTRAMUSCULAR | Status: DC
  Administered 2016-07-08: 20 mg via INTRAVENOUS
  Filled 2016-07-07: qty 2

## 2016-07-07 MED ORDER — PNEUMOCOCCAL VAC POLYVALENT 25 MCG/0.5ML IJ INJ
0.5000 mL | INJECTION | INTRAMUSCULAR | Status: AC
  Administered 2016-07-08: 0.5 mL via INTRAMUSCULAR
  Filled 2016-07-07: qty 0.5

## 2016-07-07 MED ORDER — METOPROLOL TARTRATE 25 MG PO TABS: 25.0000 mg | ORAL_TABLET | Freq: Two times a day (BID) | ORAL | Status: DC

## 2016-07-07 MED ORDER — ONDANSETRON HCL 4 MG/2ML IJ SOLN: 4.0000 mg | Freq: Four times a day (QID) | INTRAMUSCULAR | Status: DC | PRN

## 2016-07-07 MED ORDER — HYDRALAZINE HCL 25 MG PO TABS
25.0000 mg | ORAL_TABLET | Freq: Four times a day (QID) | ORAL | Status: DC | PRN
  Administered 2016-07-08: 25 mg via ORAL
  Filled 2016-07-07: qty 1

## 2016-07-07 NOTE — ED Provider Notes (Signed)
[facility] DEPT Provider Note   CSN: [id number] Arrival date & time: 07/07/16  1706     History   Chief Complaint Chief Complaint  Patient presents with  . Hypertension    HPI Rose Greer is a 61 y.o. female.  The history is provided by the patient.  Hypertension  This is a new problem. The current episode started 12 to 24 hours ago. The problem occurs constantly. The problem has not changed since onset.Associated symptoms include shortness of breath. Pertinent negatives include no chest pain, no abdominal pain and no headaches. The symptoms are aggravated by walking. The symptoms are relieved by rest. She has tried nothing for the symptoms. The treatment provided no relief.   61 year old female who presents with hypertension. She has history of HTN, HLD, DM, GERD. She was seen in her PCP's office today for diabetes check. Was noted to have elevated blood pressures 220/105. Sent to ED for evaluation. States she has been having shortness of breath, worse with ambulation and activity, better with rest. No LE edema or abdominal distension. Has needed to sleep in 3-4 pillows this year, but over past 3 days has been waking up in the night feeling winded. Smokes occasionally but no history of COPD or asthma. No chest pain. States left shoulder pain today that is worse with movement or ROM of her shoulder, without recent trauma, exertion, or strenuous activity.   Past Medical History:  Diagnosis Date  . Brain aneurysm   . Diabetes mellitus without complication ([place])   . GERD (gastroesophageal reflux disease)   . High cholesterol   . Hypertension     There are no active problems to display for this patient.   Past Surgical History:  Procedure Laterality Date  . BACK SURGERY      OB History    No data available       Home Medications    Prior to Admission medications   Medication Sig Start Date End Date Taking? Authorizing Provider  clindamycin (CLEOCIN) 300 MG capsule  Take 1 capsule (300 mg total) by mouth 4 (four) times daily. X 7 days 06/24/16   Clayton Bibles, PA-C    Family History History reviewed. No pertinent family history.  Social History Social History  Substance Use Topics  . Smoking status: Current Some Day Smoker    Packs/day: 0.25    Types: Cigarettes  . Smokeless tobacco: Never Used  . Alcohol use No     Allergies   Benazepril and Penicillins   Review of Systems Review of Systems  Constitutional: Negative for fever.  HENT: Negative for congestion.   Respiratory: Positive for shortness of breath. Negative for cough.   Cardiovascular: Negative for chest pain.  Gastrointestinal: Negative for abdominal pain.  Allergic/Immunologic: Negative for immunocompromised state.  Neurological: Negative for syncope and headaches.  Hematological: Does not bruise/bleed easily.  Psychiatric/Behavioral: Negative for confusion.  All other systems reviewed and are negative.    Physical Exam Updated Vital Signs BP (!) 191/99   Pulse 78   Resp 13   Ht 5\' 3"  (1.6 m)   Wt 148 lb (67.1 kg)   SpO2 100%   BMI 26.22 kg/m   Physical Exam Physical Exam  Nursing note and vitals reviewed. Constitutional: non-toxic, and in no acute distress Head: Normocephalic and atraumatic.  Mouth/Throat: Oropharynx is clear and moist.  Neck: Normal range of motion. Neck supple.  Cardiovascular: Normal rate and regular rhythm.   Pulmonary/Chest: Effort normal and breath sounds  normal.  Abdominal: Soft. There is no tenderness. There is no rebound and no guarding.  Musculoskeletal: Normal range of motion.  Neurological: Alert, no facial droop, fluent speech, moves all extremities symmetrically Skin: Skin is warm and dry.  Psychiatric: Cooperative   ED Treatments / Results  Labs (all labs ordered are listed, but only abnormal results are displayed) Labs Reviewed  CBC WITH DIFFERENTIAL/PLATELET - Abnormal; Notable for the following:       Result Value    WBC 13.0 (*)    Lymphs Abs 6.1 (*)    All other components within normal limits  BASIC METABOLIC PANEL - Abnormal; Notable for the following:    Potassium 3.1 (*)    Glucose, Bld 117 (*)    All other components within normal limits  BRAIN NATRIURETIC PEPTIDE - Abnormal; Notable for the following:    B Natriuretic Peptide 109.0 (*)    All other components within normal limits  TROPONIN I    EKG  EKG Interpretation  Date/Time:  Thursday July 07 2016 17:15:16 EDT Ventricular Rate:  82 PR Interval:    QRS Duration: 78 QT Interval:  330 QTC Calculation: 386 R Axis:   59 Text Interpretation:  Sinus rhythm Probable left atrial enlargement Probable left ventricular hypertrophy Baseline wander in lead(s) V4 no prior EKG  Confirmed by Jamaris Theard MD, Allisen Pidgeon [phone]) on 07/07/2016 5:18:54 PM       Radiology Dg Chest 2 View  Result Date: 07/07/2016 CLINICAL DATA:  Shortness of breath on left shoulder pain EXAM: CHEST  2 VIEW COMPARISON:  None. FINDINGS: Mild bilateral parahilar and central peribronchial opacities, likely indicating early pulmonary edema. No pleural effusion or pneumothorax. No focal airspace consolidation. Normal cardiomediastinal contours. IMPRESSION: Central pulmonary vascular congestion or early pulmonary edema without focal consolidation. Electronically Signed   By: Ulyses Jarred M.D.   On: 07/07/2016 18:05    Procedures Procedures (including critical care time)  Medications Ordered in ED Medications  furosemide (LASIX) injection 20 mg (20 mg Intravenous Given 07/07/16 1902)     Initial Impression / Assessment and Plan / ED Course  I have reviewed the triage vital signs and the nursing notes.  Pertinent labs & imaging results that were available during my care of the patient were reviewed by me and considered in my medical decision making (see chart for details).     Presenting with elevated BPs with symptoms concerning for new onset CHF. BP 207/104 on arrival, but  decreases to 182/98 w/o intervention. No significant fluid overload on exam. Mild tachypnea during conversation, but normal oxygenation on room air. CXR visualized with pulmonary vascular congestion and potential early pulmonary edema. BNP 100s. Normal troponin and non-ischemic EKG. Given 20 mg iv lasix for concern for new onset chf. Discussed with Dr. Darrick Meigs who will admit for ongoing work-up and management.  Final Clinical Impressions(s) / ED Diagnoses   Final diagnoses:  Acute systolic congestive heart failure [facility])  Hypertensive emergency    New Prescriptions New Prescriptions   No medications on file     Forde Dandy, MD 07/07/16 [phone]

## 2016-07-07 NOTE — ED Triage Notes (Signed)
Seen at  PCP today for glucose check and bp was elevated.  Last b/p 221/105  .  c/o sob times 3 days.  Left shoulder pain.

## 2016-07-07 NOTE — ED Notes (Signed)
Accepting nurse was concerned with pt's blood pressure being high, so I contacted Dr Darrick Meigs and orders were received and he gave the ok to send pt to floor.

## 2016-07-07 NOTE — H&P (Signed)
TRH H&P    Patient Demographics:    Rose Greer, is a 61 y.o. female  MRN: [id number]  DOB - Sep 15, 1955  Admit Date - 07/07/2016  Referring MD/NP/PA: Dr. Oleta Mouse  Outpatient Primary MD for the patient is [facility]  Patient coming from: Home  Chief Complaint  Patient presents with  . Hypertension      HPI:    Rose Greer  is a 61 y.o. female, With history of hypertension, GERD, diabetes mellitus who came to hospital with worsening shortness of breath for past 2 days. She has been coughing up baige colored phlegm, denies fever chills. But says that she feels cold today. She denies nausea vomiting or diarrhea. Patient has a history of hypertension and is followed by PCP. Patient takes 2 antihypertensive pills but doesn't remember the names. She denies chest pain. Today in the ED patient found to have elevated blood pressure 207/104, chest x-ray shows pulmonary edema. BNP 104.  She admits to having orthopnea. Cardiac enzymes in the ED at negative. Potassium 3.1 Patient was given Lasix 20 mg IV 1 in the ED.    Review of systems:    In addition to the HPI above,  No Fever-chills, No Headache, No changes with Vision or hearing, No problems swallowing food or Liquids, No Abdominal pain, No Nausea or Vomiting, bowel movements are regular, No Blood in stool or Urine, No dysuria, No new skin rashes or bruises, No new joints pains-aches,  No new weakness, tingling, numbness in any extremity, No recent weight gain or loss, No polyuria, polydypsia or polyphagia, No significant Mental Stressors.  A full 10 point Review of Systems was done, except as stated above, all other Review of Systems were negative.   With Past History of the following :    Past Medical History:  Diagnosis Date  . Brain aneurysm   . Diabetes mellitus without complication ([place])   . GERD  (gastroesophageal reflux disease)   . High cholesterol   . Hypertension       Past Surgical History:  Procedure Laterality Date  . BACK SURGERY        Social History:      Social History  Substance Use Topics  . Smoking status: Current Some Day Smoker    Packs/day: 0.25    Types: Cigarettes  . Smokeless tobacco: Never Used  . Alcohol use No       Family History :   Patient's father had history of heart problems   Home Medications:   Prior to Admission medications   Medication Sig Start Date End Date Taking? Authorizing Provider  atenolol (TENORMIN) 50 MG tablet Take 50 mg by mouth daily. 06/02/16   Historical Provider, MD  citalopram (CELEXA) 40 MG tablet Take 40 mg by mouth daily. 06/02/16   Historical Provider, MD  clindamycin (CLEOCIN) 300 MG capsule Take 1 capsule (300 mg total) by mouth 4 (four) times daily. X 7 days 06/24/16   Clayton Bibles, PA-C  glipiZIDE (GLUCOTROL) 5 MG tablet Take 5 mg  by mouth 2 (two) times daily. 06/02/16   Historical Provider, MD  LANTUS SOLOSTAR 100 UNIT/ML Solostar Pen Inject 12 Units into the skin daily. 06/02/16   Historical Provider, MD  mirtazapine (REMERON) 45 MG tablet Take 45 mg by mouth at bedtime. 06/02/16   Historical Provider, MD  omeprazole (PRILOSEC) 20 MG capsule Take 20 mg by mouth daily. 06/02/16   Historical Provider, MD  pravastatin (PRAVACHOL) 40 MG tablet Take 40 mg by mouth daily. 06/02/16   Historical Provider, MD  risperiDONE (RISPERDAL) 2 MG tablet Take 2 mg by mouth at bedtime. 06/02/16   Historical Provider, MD     Allergies:     Allergies  Allergen Reactions  . Benazepril Other (See Comments)    confusion  . Penicillins Rash    Has patient had a PCN reaction causing immediate rash, facial/tongue/throat swelling, SOB or lightheadedness with hypotension: Yes Has patient had a PCN reaction causing severe rash involving mucus membranes or skin necrosis: No Has patient had a PCN reaction that required hospitalization No Has  patient had a PCN reaction occurring within the last 10 years: No If all of the above answers are "NO", then may proceed with Cephalosporin use.      Physical Exam:   Vitals  Blood pressure (!) 191/99, pulse 78, resp. rate 13, height 5\' 3"  (1.6 m), weight 67.1 kg (148 lb), SpO2 100 %.  1.  General: Appears in no acute distress  2. Psychiatric:  Intact judgement and  insight, awake alert, oriented x 3.  3. Neurologic: No focal neurological deficits, all cranial nerves intact.Strength 5/5 all 4 extremities, sensation intact all 4 extremities, plantars down going.  4. Eyes :  anicteric sclerae, moist conjunctivae with no lid lag. PERRLA.  5. ENMT:  Oropharynx clear with moist mucous membranes and good dentition  6. Neck:  supple, no cervical lymphadenopathy appriciated, No thyromegaly  7. Respiratory : Normal respiratory effort, bibasilar crackles  8. Cardiovascular : RRR, no gallops, rubs or murmurs, no leg edema  9. Gastrointestinal:  Positive bowel sounds, abdomen soft, non-tender to palpation,no hepatosplenomegaly, no rigidity or guarding       10. Skin:  No cyanosis, normal texture and turgor, no rash, lesions or ulcers  11.Musculoskeletal:  Good muscle tone,  joints appear normal , no effusions,  normal range of motion    Data Review:    CBC  Recent Labs Lab 07/07/16 1731  WBC 13.0*  HGB 14.7  HCT 44.3  PLT 224  MCV 86.7  MCH 28.8  MCHC 33.2  RDW 14.6  LYMPHSABS 6.1*  MONOABS 1.0  EOSABS 0.2  BASOSABS 0.1   ------------------------------------------------------------------------------------------------------------------  Chemistries   Recent Labs Lab 07/07/16 1731  NA 136  K 3.1*  CL 101  CO2 22  GLUCOSE 117*  BUN 6  CREATININE 0.77  CALCIUM 9.4    ------------------------------------------------------------------------------------------------------------------  --------------------------------------------------------------------------------Cardiac Enzymes:  Recent Labs Lab 07/07/16 1731  TROPONINI <0.03    --------------------------------------------------------------------------------------------------------------- Urine analysis: No results found for: COLORURINE, APPEARANCEUR, LABSPEC, PHURINE, GLUCOSEU, HGBUR, BILIRUBINUR, KETONESUR, PROTEINUR, UROBILINOGEN, NITRITE, LEUKOCYTESUR    Imaging Results:    Dg Chest 2 View  Result Date: 07/07/2016 CLINICAL DATA:  Shortness of breath on left shoulder pain EXAM: CHEST  2 VIEW COMPARISON:  None. FINDINGS: Mild bilateral parahilar and central peribronchial opacities, likely indicating early pulmonary edema. No pleural effusion or pneumothorax. No focal airspace consolidation. Normal cardiomediastinal contours. IMPRESSION: Central pulmonary vascular congestion or early pulmonary edema without focal consolidation. Electronically Signed  By: Ulyses Jarred M.D.   On: 07/07/2016 18:05    My personal review of EKG: Rhythm NSR, LVH   Assessment & Plan:    Active Problems:   Hypertensive urgency   Pulmonary edema   DM (diabetes mellitus) ([place])   1. 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 Follow strict intake and output. Will obtain echocardiogram in a.m. 2. Hypertensive urgency- patient given Lasix in the ED, will start metoprolol 25 mg by mouth twice a day. Hydralazine 25 mg by mouth every 6 hours when necessary for BP greater than 160/100. 3. Diabetes mellitus-start sliding scale insulin with NovoLog.   DVT Prophylaxis-   Lovenox   AM Labs Ordered, also please review Full Orders  Family Communication: Admission, patients condition and plan of care including tests being ordered have been discussed with the patient   who indicate understanding and agree with the plan and Code Status.  Code Status:  Full code  Admission status: Observation    Time spent in minutes : 60 minutes   Bonnell Placzek S M.D on 07/07/2016 at 7:43 PM  Between 7am to 7pm - Pager - [phone]. After 7pm go to [email or web address] - password [facility]  [facility] - Office  [phone]

## 2016-07-08 ENCOUNTER — Observation Stay (HOSPITAL_COMMUNITY): Payer: Medicare Other

## 2016-07-08 ENCOUNTER — Observation Stay (HOSPITAL_BASED_OUTPATIENT_CLINIC_OR_DEPARTMENT_OTHER): Payer: Medicare Other

## 2016-07-08 DIAGNOSIS — Z79899 Other long term (current) drug therapy: Secondary | ICD-10-CM | POA: Diagnosis not present

## 2016-07-08 DIAGNOSIS — E119 Type 2 diabetes mellitus without complications: Secondary | ICD-10-CM | POA: Diagnosis present

## 2016-07-08 DIAGNOSIS — I161 Hypertensive emergency: Secondary | ICD-10-CM | POA: Diagnosis present

## 2016-07-08 DIAGNOSIS — I429 Cardiomyopathy, unspecified: Secondary | ICD-10-CM | POA: Diagnosis present

## 2016-07-08 DIAGNOSIS — I509 Heart failure, unspecified: Secondary | ICD-10-CM

## 2016-07-08 DIAGNOSIS — I5021 Acute systolic (congestive) heart failure: Secondary | ICD-10-CM | POA: Diagnosis not present

## 2016-07-08 DIAGNOSIS — F17210 Nicotine dependence, cigarettes, uncomplicated: Secondary | ICD-10-CM | POA: Diagnosis present

## 2016-07-08 DIAGNOSIS — I5033 Acute on chronic diastolic (congestive) heart failure: Secondary | ICD-10-CM | POA: Diagnosis present

## 2016-07-08 DIAGNOSIS — E785 Hyperlipidemia, unspecified: Secondary | ICD-10-CM | POA: Diagnosis present

## 2016-07-08 DIAGNOSIS — I110 Hypertensive heart disease with heart failure: Secondary | ICD-10-CM | POA: Diagnosis present

## 2016-07-08 DIAGNOSIS — E7800 Pure hypercholesterolemia, unspecified: Secondary | ICD-10-CM | POA: Diagnosis present

## 2016-07-08 DIAGNOSIS — J40 Bronchitis, not specified as acute or chronic: Secondary | ICD-10-CM | POA: Diagnosis present

## 2016-07-08 DIAGNOSIS — Z880 Allergy status to penicillin: Secondary | ICD-10-CM | POA: Diagnosis not present

## 2016-07-08 DIAGNOSIS — M25512 Pain in left shoulder: Secondary | ICD-10-CM | POA: Diagnosis present

## 2016-07-08 DIAGNOSIS — Z23 Encounter for immunization: Secondary | ICD-10-CM | POA: Diagnosis not present

## 2016-07-08 DIAGNOSIS — Z888 Allergy status to other drugs, medicaments and biological substances status: Secondary | ICD-10-CM | POA: Diagnosis not present

## 2016-07-08 DIAGNOSIS — J9601 Acute respiratory failure with hypoxia: Secondary | ICD-10-CM | POA: Diagnosis present

## 2016-07-08 DIAGNOSIS — Z794 Long term (current) use of insulin: Secondary | ICD-10-CM | POA: Diagnosis not present

## 2016-07-08 DIAGNOSIS — K219 Gastro-esophageal reflux disease without esophagitis: Secondary | ICD-10-CM | POA: Diagnosis present

## 2016-07-08 LAB — ECHOCARDIOGRAM COMPLETE
AVLVOTPG: 3 mmHg
CHL CUP DOP CALC LVOT VTI: 17 cm
CHL CUP STROKE VOLUME: 31 mL
E decel time: 275 msec
E/e' ratio: 10.1
FS: 30 % (ref 28–44)
Height: 63 in
IV/PV OW: 1.12
LA ID, A-P, ES: 37 mm
LA diam end sys: 37 mm
LA diam index: 2.12 cm/m2
LA vol A4C: 43 ml
LA vol: 50.5 mL
LAVOLIN: 29 mL/m2
LDCA: 2.54 cm2
LV E/e' medial: 10.1
LV E/e'average: 10.1
LV PW d: 11.2 mm — AB (ref 0.6–1.1)
LV SIMPSON'S DISK: 57
LV TDI E'LATERAL: 6.31
LV dias vol index: 31 mL/m2
LV dias vol: 54 mL (ref 46–106)
LV e' LATERAL: 6.31 cm/s
LV sys vol index: 13 mL/m2
LVOTD: 18 mm
LVOTPV: 89.4 cm/s
LVOTSV: 43 mL
LVSYSVOL: 23 mL
MV Dec: 275
MV pk A vel: 77.1 m/s
MV pk E vel: 63.7 m/s
RV LATERAL S' VELOCITY: 12.1 cm/s
TAPSE: 17.7 mm
TDI e' medial: 5.55
Weight: 2368 oz

## 2016-07-08 LAB — COMPREHENSIVE METABOLIC PANEL
ALK PHOS: 85 U/L (ref 38–126)
ALT: 21 U/L (ref 14–54)
AST: 36 U/L (ref 15–41)
Albumin: 4 g/dL (ref 3.5–5.0)
Anion gap: 8 (ref 5–15)
BUN: 6 mg/dL (ref 6–20)
CALCIUM: 8.7 mg/dL — AB (ref 8.9–10.3)
CO2: 26 mmol/L (ref 22–32)
CREATININE: 0.68 mg/dL (ref 0.44–1.00)
Chloride: 100 mmol/L — ABNORMAL LOW (ref 101–111)
GFR calc non Af Amer: >60 mL/min (ref >60)
GLUCOSE: 185 mg/dL — AB (ref 65–99)
Potassium: 3.7 mmol/L (ref 3.5–5.1)
SODIUM: 134 mmol/L — AB (ref 135–145)
Total Bilirubin: 0.8 mg/dL (ref 0.3–1.2)
Total Protein: 8.2 g/dL — ABNORMAL HIGH (ref 6.5–8.1)

## 2016-07-08 LAB — GLUCOSE, CAPILLARY
GLUCOSE-CAPILLARY: 216 mg/dL — AB (ref 65–99)
GLUCOSE-CAPILLARY: 119 mg/dL — AB (ref 65–99)
Glucose-Capillary: 174 mg/dL — ABNORMAL HIGH (ref 65–99)
Glucose-Capillary: 230 mg/dL — ABNORMAL HIGH (ref 65–99)

## 2016-07-08 LAB — CBC
HCT: 42.9 % (ref 36.0–46.0)
HEMOGLOBIN: 14.1 g/dL (ref 12.0–15.0)
MCH: 28.4 pg (ref 26.0–34.0)
MCHC: 32.9 g/dL (ref 30.0–36.0)
MCV: 86.5 fL (ref 78.0–100.0)
Platelets: 200 10*3/uL (ref 150–400)
RBC: 4.96 MIL/uL (ref 3.87–5.11)
RDW: 14.6 % (ref 11.5–15.5)
WBC: 10.5 10*3/uL (ref 4.0–10.5)

## 2016-07-08 LAB — TROPONIN I: Troponin I: <0.03 ng/mL (ref <0.03)

## 2016-07-08 MED ORDER — RISPERIDONE 1 MG PO TABS
2.0000 mg | ORAL_TABLET | Freq: Every day | ORAL | Status: DC
  Administered 2016-07-08 (×2): 2 mg via ORAL
  Filled 2016-07-08 (×2): qty 2

## 2016-07-08 MED ORDER — HYDRALAZINE HCL 25 MG PO TABS
50.0000 mg | ORAL_TABLET | Freq: Three times a day (TID) | ORAL | Status: DC
  Administered 2016-07-08 – 2016-07-09 (×3): 50 mg via ORAL
  Filled 2016-07-08 (×3): qty 2

## 2016-07-08 MED ORDER — CARVEDILOL 3.125 MG PO TABS
6.2500 mg | ORAL_TABLET | Freq: Two times a day (BID) | ORAL | Status: DC
Start: 2016-07-08 — End: 2016-07-08

## 2016-07-08 MED ORDER — LEVOFLOXACIN 750 MG PO TABS
750.0000 mg | ORAL_TABLET | Freq: Every day | ORAL | Status: DC
  Administered 2016-07-08 – 2016-07-09 (×2): 750 mg via ORAL
  Filled 2016-07-08 (×2): qty 1

## 2016-07-08 MED ORDER — HYDRALAZINE HCL 25 MG PO TABS: 50.0000 mg | ORAL_TABLET | Freq: Three times a day (TID) | ORAL | Status: DC

## 2016-07-08 MED ORDER — MIRTAZAPINE 30 MG PO TABS
45.0000 mg | ORAL_TABLET | Freq: Every day | ORAL | Status: DC
  Administered 2016-07-08 (×2): 45 mg via ORAL
  Filled 2016-07-08 (×2): qty 1

## 2016-07-08 MED ORDER — GLIMEPIRIDE 2 MG PO TABS
1.0000 mg | ORAL_TABLET | Freq: Every day | ORAL | Status: DC
  Administered 2016-07-08 – 2016-07-09 (×2): 1 mg via ORAL
  Filled 2016-07-08 (×2): qty 1

## 2016-07-08 MED ORDER — ISOSORBIDE MONONITRATE ER 60 MG PO TB24
30.0000 mg | ORAL_TABLET | Freq: Every day | ORAL | Status: DC
Start: 2016-07-08 — End: 2016-07-09
  Administered 2016-07-08 – 2016-07-09 (×2): 30 mg via ORAL
  Filled 2016-07-08 (×2): qty 1

## 2016-07-08 MED ORDER — FUROSEMIDE 10 MG/ML IJ SOLN
40.0000 mg | Freq: Once | INTRAMUSCULAR | Status: AC
  Administered 2016-07-08: 40 mg via INTRAVENOUS
  Filled 2016-07-08: qty 4

## 2016-07-08 MED ORDER — HYDRALAZINE HCL 20 MG/ML IJ SOLN: 10.0000 mg | Freq: Four times a day (QID) | INTRAMUSCULAR | Status: DC | PRN

## 2016-07-08 MED ORDER — POTASSIUM CHLORIDE CRYS ER 20 MEQ PO TBCR
40.0000 meq | EXTENDED_RELEASE_TABLET | Freq: Once | ORAL | Status: AC
  Administered 2016-07-08: 40 meq via ORAL
  Filled 2016-07-08: qty 2

## 2016-07-08 MED ORDER — LIVING WELL WITH DIABETES BOOK
Freq: Once | Status: AC
  Administered 2016-07-08: 16:00:00
  Filled 2016-07-08: qty 1

## 2016-07-08 MED ORDER — CARVEDILOL 3.125 MG PO TABS
6.2500 mg | ORAL_TABLET | Freq: Two times a day (BID) | ORAL | Status: DC
  Administered 2016-07-08 – 2016-07-09 (×3): 6.25 mg via ORAL
  Filled 2016-07-08 (×3): qty 2

## 2016-07-08 NOTE — Progress Notes (Signed)
*  PRELIMINARY RESULTS* Echocardiogram 2D Echocardiogram has been performed.  Rose Greer 07/08/2016, 2:30 PM

## 2016-07-08 NOTE — Care Management Obs Status (Signed)
[facility] NOTIFICATION   Patient Details  Name: Rose Greer MRN: [id number] Date of Birth: 1956/03/23   Medicare Observation Status Notification Given:  Yes    Sherald Barge, RN 07/08/2016, 2:08 PM

## 2016-07-08 NOTE — Progress Notes (Addendum)
PROGRESS NOTE                                                                                                                                                                                                             Patient Demographics:    Rose Greer, is a 61 y.o. female, DOB - Aug 14, 1955, NOM:[id number]  Admit date - 07/07/2016   Admitting Physician Oswald Hillock, MD  Outpatient Primary MD for the patient is [facility]  LOS - 0  Chief Complaint  Patient presents with  . Hypertension       Brief Narrative   Rose Greer  is a 61 y.o. female, With history of hypertension, GERD, diabetes mellitus who came to hospital with worsening shortness of breath for past 2 days. She was admitted for acute of possible respiratory failure due to pulmonary edema caused by hypertensive crisis and possible bronchitis.   Subjective:    Rose Greer today has, No headache, No chest pain, No abdominal pain - No Nausea, No new weakness tingling or numbness, No Cough - Improved SOB.     Assessment  & Plan :      1.Acute hypoxic respiratory failure due to pulmonary edema caused by hypertensive crisis. Much improved after Lasix, blood pressure medications have been adjusted, continue supportive care with oxygen and nebulizer treatments and obtain echocardiogram.  2. Bronchitis. No signs of pneumonia taper to Levaquin.  3. DM type II. On sliding scale.Check A1c and place on Amaryl, diabetic education.  CBG (last 3)   Recent Labs  07/07/16 2206 07/08/16 0740  GLUCAP 138* 216*      Diet : Diet heart healthy/carb modified Room service appropriate? Yes; Fluid consistency: Thin; Fluid restriction: 1500 mL Fluid    Family Communication  :  Daughter  Code Status :  Full  Disposition Plan  :  Home 1-2 days  Consults  :  None  Procedures  :    TTE  DVT Prophylaxis  :  Lovenox    Lab Results    Component Value Date   PLT 200 07/08/2016    Inpatient Medications  Scheduled Meds: . carvedilol  6.25 mg Oral BID WC  . enoxaparin (LOVENOX) injection  40 mg Subcutaneous Q24H  . famotidine  20 mg Oral BID  . furosemide  20 mg  Intravenous Q12H  . hydrALAZINE  50 mg Oral Q8H  . insulin aspart  0-9 Units Subcutaneous TID WC  . isosorbide mononitrate  30 mg Oral Daily  . mirtazapine  45 mg Oral QHS  . [COMPLETED] pneumococcal 23 valent vaccine  0.5 mL Intramuscular Tomorrow-1000  . risperiDONE  2 mg Oral QHS   Continuous Infusions: PRN Meds:.acetaminophen **OR** acetaminophen, hydrALAZINE, ondansetron **OR** ondansetron (ZOFRAN) IV  Antibiotics  :    Anti-infectives    None         Objective:   Vitals:   07/07/16 2030 07/07/16 2100 07/08/16 0411 07/08/16 0752  BP: (!) 204/98 (!) 183/98 (!) 188/87 (!) 168/85  Pulse:  77 80 79  Resp: 14 18 18    Temp:  99.3 F (37.4 C) 98.3 F (36.8 C)   TempSrc:  Oral Oral   SpO2:  99% 97%   Weight:      Height:        Wt Readings from Last 3 Encounters:  07/07/16 67.1 kg (148 lb)  06/24/16 67.1 kg (148 lb)  03/27/15 67.1 kg (148 lb)     Intake/Output Summary (Last 24 hours) at 07/08/16 0933 Last data filed at 07/08/16 0600  Gross per 24 hour  Intake                0 ml  Output              550 ml  Net             -550 ml     Physical Exam  Awake Alert, Oriented X 3, No new F.N deficits, Normal affect Rose Greer.AT,PERRAL Supple Neck,No JVD, No cervical lymphadenopathy appriciated.  Symmetrical Chest wall movement, Good air movement bilaterally, ++ rales RRR,No Gallops,Rubs or new Murmurs, No Parasternal Heave +ve B.Sounds, Abd Soft, No tenderness, No organomegaly appriciated, No rebound - guarding or rigidity. No Cyanosis, Clubbing or edema, No new Rash or bruise       Data Review:    CBC  Recent Labs Lab 07/07/16 1731 07/08/16 0318  WBC 13.0* 10.5  HGB 14.7 14.1  HCT 44.3 42.9  PLT 224 200  MCV 86.7 86.5   MCH 28.8 28.4  MCHC 33.2 32.9  RDW 14.6 14.6  LYMPHSABS 6.1*  --   MONOABS 1.0  --   EOSABS 0.2  --   BASOSABS 0.1  --     Chemistries   Recent Labs Lab 07/07/16 1731 07/08/16 0318  NA 136 134*  K 3.1* 3.7  CL 101 100*  CO2 22 26  GLUCOSE 117* 185*  BUN 6 6  CREATININE 0.77 0.68  CALCIUM 9.4 8.7*  AST  --  36  ALT  --  21  ALKPHOS  --  85  BILITOT  --  0.8   ------------------------------------------------------------------------------------------------------------------ No results for input(s): CHOL, HDL, LDLCALC, TRIG, CHOLHDL, LDLDIRECT in the last 72 hours.  No results found for: HGBA1C ------------------------------------------------------------------------------------------------------------------ No results for input(s): TSH, T4TOTAL, T3FREE, THYROIDAB in the last 72 hours.  Invalid input(s): FREET3 ------------------------------------------------------------------------------------------------------------------ No results for input(s): VITAMINB12, FOLATE, FERRITIN, TIBC, IRON, RETICCTPCT in the last 72 hours.  Coagulation profile No results for input(s): INR, PROTIME in the last 168 hours.  No results for input(s): DDIMER in the last 72 hours.  Cardiac Enzymes  Recent Labs Lab 07/07/16 1731 07/07/16 2211 07/08/16 0318  TROPONINI <0.03 <0.03 <0.03   ------------------------------------------------------------------------------------------------------------------    Component Value Date/Time   BNP 109.0 (H) 07/07/2016 1736  Micro Results No results found for this or any previous visit (from the past 240 hour(s)).  Radiology Reports Dg Chest 2 View  Result Date: 07/07/2016 CLINICAL DATA:  Shortness of breath on left shoulder pain EXAM: CHEST  2 VIEW COMPARISON:  None. FINDINGS: Mild bilateral parahilar and central peribronchial opacities, likely indicating early pulmonary edema. No pleural effusion or pneumothorax. No focal airspace  consolidation. Normal cardiomediastinal contours. IMPRESSION: Central pulmonary vascular congestion or early pulmonary edema without focal consolidation. Electronically Signed   By: Ulyses Jarred M.D.   On: 07/07/2016 18:05    Time Spent in minutes  30   Lala Lund M.D on 07/08/2016 at 9:33 AM  Between 7am to 7pm - Pager - [phone] ( page via [facility], text pages only, please mention full 10 digit call back number).  After 7pm go to [email or web address] - password [facility]  [facility] -  Office  [phone]

## 2016-07-08 NOTE — Care Management Note (Signed)
Case Management Note  Patient Details  Name: Rose Greer MRN: [id number] Date of Birth: 1955/04/23  Subjective/Objective:                  Pt admitted with HTN. She is from home, lives with family and is ind with ADL's. She has PCP, transportation and insurance with drug coverage. CM consult for not having glucometer. Pt aware that medicare will only pay for glucometer once every 5 years.  Pt has no HH pta. Family at bedside. No other needs communicated.   Action/Plan: Pt plans to return home with self care. She will receive order for glucometer at DC.   Expected Discharge Date:      07/10/2016            Expected Discharge Plan:  Home/Self Care  In-House Referral:  NA  Discharge planning Services  CM Consult  Post Acute Care Choice:  NA Choice offered to:  NA  Status of Service:  Completed, signed off Sherald Barge, RN 07/08/2016, 2:09 PM

## 2016-07-08 NOTE — Progress Notes (Signed)
Inpatient Diabetes Program Recommendations  AACE/ADA: New Consensus Statement on Inpatient Glycemic Control (2015)  Target Ranges:  Prepandial:   less than 140 mg/dL      Peak postprandial:   less than 180 mg/dL (1-2 hours)      Critically ill patients:  140 - 180 mg/dL  Results for Rose Greer, Rose Greer (MRN [id number]) as of 07/08/2016 11:27  Ref. Range 07/07/2016 22:06 07/08/2016 07:40 07/08/2016 11:08  Glucose-Capillary Latest Ref Range: 65 - 99 mg/dL 138 (H) 216 (H) 174 (H)    Review of Glycemic Control  Diabetes history: DM2 Outpatient Diabetes medications: Lantus 12 units QHS, Glipizide 5 mg BID Current orders for Inpatient glycemic control: Amaryl 1 mg QAM, Novolog 0-9 units TID with meals  Inpatient Diabetes Program Recommendations: HgbA1C: A1C in process.  NOTE: Consult received for education. Spoke with patient over phone (Diabetes Coordinator not on campus today) about diabetes and home regimen for diabetes control. Patient reports that she is followed by PCP for diabetes management and currently she takes Lantus 12 units QHS and Glipizide 5 mg BID as an outpatient for diabetes control.  Patient reports that she is taking DM medication as prescribed and that she last seen her PCP yesterday.  Patient states that she does not have a glucometer so she is not monitoring glucose. Patient reports that she use to check her glucose in the past. Inquired about knowledge of target glucose goals and patient is not able to verbalize normal glucose values.  Discussed glucose and A1C goals. Discussed importance of checking CBGs and maintaining good CBG control to prevent long-term and short-term complications. Explained how hyperglycemia leads to damage within blood vessels which lead to the common complications seen with uncontrolled diabetes. Stressed to the patient the importance of improving glycemic control to prevent further complications from uncontrolled diabetes. Discussed impact of nutrition, exercise,  stress, sickness, and medications on diabetes control. Patient states that she recently stopped drinking regular sodas about one month ago. Discussed carbohydrates, carbohydrate goals per day and meal, along with portion sizes. Encouraged patient to check her glucose at least 2 times per day (before meals and at bedtime) and to keep a log book of glucose readings and insulin taken which she will need to take to doctor appointments. Explained how her doctor can use the log book to continue to make DM medication adjustments if needed. Informed patient Living Well with Diabetes booklet would be ordered and she was encouraged to read entire book when received.  Patient verbalized understanding of information discussed and she states that she has no further questions at this time related to diabetes.  MD, please provide patient with Rx for: glucometer and testing supplies  Thanks, Barnie Alderman, RN, MSN, CDE Diabetes Coordinator Inpatient Diabetes Program [phone] (Team Pager)

## 2016-07-09 DIAGNOSIS — I5021 Acute systolic (congestive) heart failure: Secondary | ICD-10-CM

## 2016-07-09 LAB — HEMOGLOBIN A1C
HEMOGLOBIN A1C: 8.9 % — AB (ref 4.8–5.6)
Hgb A1c MFr Bld: 9 % — ABNORMAL HIGH (ref 4.8–5.6)
MEAN PLASMA GLUCOSE: 212 mg/dL
Mean Plasma Glucose: 209 mg/dL

## 2016-07-09 LAB — BASIC METABOLIC PANEL
Anion gap: 8 (ref 5–15)
BUN: 16 mg/dL (ref 6–20)
CALCIUM: 8.9 mg/dL (ref 8.9–10.3)
CHLORIDE: 102 mmol/L (ref 101–111)
CO2: 26 mmol/L (ref 22–32)
Creatinine, Ser: 0.98 mg/dL (ref 0.44–1.00)
GFR calc Af Amer: >60 mL/min (ref >60)
GFR calc non Af Amer: >60 mL/min (ref >60)
Glucose, Bld: 221 mg/dL — ABNORMAL HIGH (ref 65–99)
Potassium: 4.2 mmol/L (ref 3.5–5.1)
SODIUM: 136 mmol/L (ref 135–145)

## 2016-07-09 LAB — GLUCOSE, CAPILLARY: GLUCOSE-CAPILLARY: 236 mg/dL — AB (ref 65–99)

## 2016-07-09 LAB — HIV ANTIBODY (ROUTINE TESTING W REFLEX): HIV SCREEN 4TH GENERATION: NONREACTIVE

## 2016-07-09 MED ORDER — POTASSIUM CHLORIDE ER 10 MEQ PO TBCR: 10.0000 meq | EXTENDED_RELEASE_TABLET | Freq: Every day | ORAL | 0 refills | Status: DC

## 2016-07-09 MED ORDER — GLIPIZIDE 5 MG PO TABS: 5.0000 mg | ORAL_TABLET | Freq: Two times a day (BID) | ORAL | 0 refills | Status: DC

## 2016-07-09 MED ORDER — HYDRALAZINE HCL 50 MG PO TABS: 50.0000 mg | ORAL_TABLET | Freq: Three times a day (TID) | ORAL | 0 refills | Status: DC

## 2016-07-09 MED ORDER — LEVOFLOXACIN 750 MG PO TABS: 750.0000 mg | ORAL_TABLET | Freq: Every day | ORAL | 0 refills | Status: DC

## 2016-07-09 MED ORDER — FUROSEMIDE 10 MG/ML IJ SOLN
80.0000 mg | Freq: Once | INTRAMUSCULAR | Status: AC
  Administered 2016-07-09: 80 mg via INTRAVENOUS
  Filled 2016-07-09: qty 8

## 2016-07-09 MED ORDER — ISOSORBIDE MONONITRATE ER 30 MG PO TB24: 30.0000 mg | ORAL_TABLET | Freq: Every day | ORAL | 0 refills | Status: DC

## 2016-07-09 MED ORDER — CARVEDILOL 6.25 MG PO TABS: 6.2500 mg | ORAL_TABLET | Freq: Two times a day (BID) | ORAL | 0 refills | Status: DC

## 2016-07-09 MED ORDER — FUROSEMIDE 40 MG PO TABS: 40.0000 mg | ORAL_TABLET | Freq: Every day | ORAL | 0 refills | Status: DC

## 2016-07-09 NOTE — Discharge Summary (Signed)
Rose Greer HER:[id number] DOB: 1956-01-15 DOA: 07/07/2016  PCP: [place] date: 07/07/2016  Discharge date: 07/09/2016  Admitted From: Home   Disposition:  Home   Recommendations for Outpatient Follow-up:   Follow up with PCP in 1-2 weeks  PCP Please obtain BMP/CBC, 2 view CXR in 1week,  (see Discharge instructions)   PCP Please follow up on the following pending results: Monitor CBGs, BP, BMP and diuretic dose closely. Needs outpatient cardiology follow-up   Home Health: None   Equipment/Devices: None  Consultations: None Discharge Condition: Stable   CODE STATUS:    Diet Recommendation: Diet heart healthy/carb modified Fluid restriction: 1500 mL Fluid/day      Chief Complaint  Patient presents with  . Hypertension     Brief history of present illness from the day of admission and additional interim summary    FannieBaileyis a 61 y.o.female,With history of hypertension, GERD, diabetes mellitus who came to hospital with worsening shortness of breath for past 2 days. She was admitted for acute of possible respiratory failure due to pulmonary edema caused by hypertensive crisis and possible bronchitis.                                                                 Hospital Course    1.Acute hypoxic respiratory failure due to pulmonary edema caused by hypertensive crisis with acute on chronic diastolic CHF EF 85%.  Much improved and at baseline after diuresis with IV Lasix, symptom-free and ambulating in the hallway on room air, will be placed on oral Lasix, Coreg, hydralazine, Imdur along with potassium supplementation for better blood pressure control and for CHF. Echo shows EF of 50% with diffuse hypokinesis, she is chest pain-free, troponin was negative 3, will add  aspirin to above medications and have her follow with cardiology outpatient.  2. Bronchitis. No signs of pneumonia taper to Levaquin for 3 more days orally.  3. DM type II. Is on Lantus and Glucotrol regimen at home, question compliance A1c was 9, request PCP to monitor CBGs closely and adjust as needed.    Discharge diagnosis     Active Problems:   Hypertensive urgency   Pulmonary edema   DM (diabetes mellitus) ([place])   Acute systolic congestive heart failure [employer])    Discharge instructions    Discharge Instructions    Discharge instructions    Complete by:  As directed    Follow with Primary Rose Greer in 3 days   Get CBC, CMP, 2 view Chest X ray checked  by Primary MD in 3 days ( we routinely change or add medications that can affect your baseline labs and fluid status, therefore we recommend that you get the mentioned basic workup next visit with your PCP,  your PCP may decide not to get them or add new tests based on their clinical decision)  Activity: As tolerated with Full fall precautions use walker/cane & assistance as needed  Disposition Home    Diet:   Diet heart healthy/carb modified Fluid restriction: 1500 mL Fluid/day    Accuchecks 4 times/day, Once in AM empty stomach and then before each meal. Log in all results and show them to your Prim.MD in 3 days. If any glucose reading is under 80 or above 300 call your Prim MD immidiately. Follow Low glucose instructions for glucose under 80 as instructed.  For Heart failure patients - Check your Weight same time everyday, if you gain over 2 pounds, or you develop in leg swelling, experience more shortness of breath or chest pain, call your Primary MD immediately. Follow Cardiac Low Salt Diet and 1.5 lit/day fluid restriction.  On your next visit with your primary care physician please Get Medicines reviewed and adjusted.  Please request your Prim.MD to go over all Hospital Tests and  Procedure/Radiological results at the follow up, please get all Hospital records sent to your Prim MD by signing hospital release before you go home.  If you experience worsening of your admission symptoms, develop shortness of breath, life threatening emergency, suicidal or homicidal thoughts you must seek medical attention immediately by calling 911 or calling your MD immediately  if symptoms less severe.  You Must read complete instructions/literature along with all the possible adverse reactions/side effects for all the Medicines you take and that have been prescribed to you. Take any new Medicines after you have completely understood and accpet all the possible adverse reactions/side effects.   Do not drive, operate heavy machinery, perform activities at heights, swimming or participation in water activities or provide baby sitting services if your were admitted for syncope or siezures until you have seen by Primary MD or a Neurologist and advised to do so again.  Do not drive when taking Pain medications.    Do not take more than prescribed Pain, Sleep and Anxiety Medications  Special Instructions: If you have smoked or chewed Tobacco  in the last 2 yrs please stop smoking, stop any regular Alcohol  and or any Recreational drug use.  Wear Seat belts while driving.   Please note  You were cared for by a hospitalist during your hospital stay. If you have any questions about your discharge medications or the care you received while you were in the hospital after you are discharged, you can call the unit and asked to speak with the hospitalist on call if the hospitalist that took care of you is not available. Once you are discharged, your primary care physician will handle any further medical issues. Please note that NO REFILLS for any discharge medications will be authorized once you are discharged, as it is imperative that you return to your primary care physician (or establish a relationship  with a primary care physician if you do not have one) for your aftercare needs so that they can reassess your need for medications and monitor your lab values.   Increase activity slowly    Complete by:  As directed       Discharge Medications   Allergies as of 07/09/2016      Reactions   Benazepril Other (See Comments)   confusion   Penicillins Rash   Has patient had a PCN reaction causing immediate rash, facial/tongue/throat swelling, SOB or lightheadedness with hypotension: Yes Has patient had  a PCN reaction causing severe rash involving mucus membranes or skin necrosis: No Has patient had a PCN reaction that required hospitalization No Has patient had a PCN reaction occurring within the last 10 years: No If all of the above answers are "NO", then may proceed with Cephalosporin use.      Medication List    STOP taking these medications   atenolol 50 MG tablet Commonly known as:  TENORMIN     TAKE these medications   aspirin EC 81 MG tablet Take 81 mg by mouth daily.   carvedilol 6.25 MG tablet Commonly known as:  COREG Take 1 tablet (6.25 mg total) by mouth 2 (two) times daily with a meal.   citalopram 40 MG tablet Commonly known as:  CELEXA Take 40 mg by mouth daily.   furosemide 40 MG tablet Commonly known as:  LASIX Take 1 tablet (40 mg total) by mouth daily.   glipiZIDE 5 MG tablet Commonly known as:  GLUCOTROL Take 1 tablet (5 mg total) by mouth 2 (two) times daily.   hydrALAZINE 50 MG tablet Commonly known as:  APRESOLINE Take 1 tablet (50 mg total) by mouth every 8 (eight) hours.   isosorbide mononitrate 30 MG 24 hr tablet Commonly known as:  IMDUR Take 1 tablet (30 mg total) by mouth daily.   LANTUS SOLOSTAR 100 UNIT/ML Solostar Pen Generic drug:  Insulin Glargine Inject 12 Units into the skin daily.   levofloxacin 750 MG tablet Commonly known as:  LEVAQUIN Take 1 tablet (750 mg total) by mouth daily.   mirtazapine 45 MG tablet Commonly known  as:  REMERON Take 45 mg by mouth at bedtime.   omeprazole 20 MG capsule Commonly known as:  PRILOSEC Take 20 mg by mouth daily.   potassium chloride 10 MEQ tablet Commonly known as:  K-DUR Take 1 tablet (10 mEq total) by mouth daily.   pravastatin 40 MG tablet Commonly known as:  PRAVACHOL Take 40 mg by mouth daily.   risperiDONE 2 MG tablet Commonly known as:  RISPERDAL Take 2 mg by mouth at bedtime.       [facility]. Schedule an appointment as soon as possible for a visit in 3 day(s).   Contact information: [address] [place] [address] [phone]        Carlyle Dolly, MD. Schedule an appointment as soon as possible for a visit in 1 week(s).   Specialty:  Cardiology Why:  CHF Contact information: [address] [place] [address] [phone]           Major procedures and Radiology Reports - PLEASE review detailed and final reports thoroughly  -     TTE  Left ventricle: The cavity size was normal. Wall thickness was   increased in a pattern of mild LVH. Systolic function was at the   lower limits of normal. The estimated ejection fraction was 50%.   Diffuse hypokinesis. Doppler parameters are consistent with   abnormal left ventricular relaxation (grade 1 diastolic   dysfunction). - Left atrium: The atrium was mildly dilated.   Dg Chest 2 View  Result Date: 07/07/2016 CLINICAL DATA:  Shortness of breath on left shoulder pain EXAM: CHEST  2 VIEW COMPARISON:  None. FINDINGS: Mild bilateral parahilar and central peribronchial opacities, likely indicating early pulmonary edema. No pleural effusion or pneumothorax. No focal airspace consolidation. Normal cardiomediastinal contours. IMPRESSION: Central pulmonary vascular congestion or early pulmonary edema without  focal consolidation. Electronically Signed   By: Ulyses Jarred M.D.   On: 07/07/2016 18:05    Micro Results     No results found  for this or any previous visit (from the past 240 hour(s)).  Today   Subjective    Rose Greer today has no headache,no chest abdominal pain,no new weakness tingling or numbness, feels much better wants to go home today.     Objective   Blood pressure (!) 144/66, pulse 80, temperature 98.4 F (36.9 C), temperature source Oral, resp. rate 18, height 5\' 3"  (1.6 m), weight 67.1 kg (148 lb), SpO2 95 %.   Intake/Output Summary (Last 24 hours) at 07/09/16 0835 Last data filed at 07/09/16 0741  Gross per 24 hour  Intake              360 ml  Output             1000 ml  Net             -640 ml    Exam Awake Alert, Oriented x 3, No new F.N deficits, Normal affect [place].AT,PERRAL Supple Neck,No JVD, No cervical lymphadenopathy appriciated.  Symmetrical Chest wall movement, Good air movement bilaterally, few rales RRR,No Gallops,Rubs or new Murmurs, No Parasternal Heave +ve B.Sounds, Abd Soft, Non tender, No organomegaly appriciated, No rebound -guarding or rigidity. No Cyanosis, Clubbing or edema, No new Rash or bruise   Data Review   CBC w Diff: Lab Results  Component Value Date   WBC 10.5 07/08/2016   HGB 14.1 07/08/2016   HCT 42.9 07/08/2016   PLT 200 07/08/2016   LYMPHOPCT 47 07/07/2016   MONOPCT 7 07/07/2016   EOSPCT 2 07/07/2016   BASOPCT 0 07/07/2016    CMP: Lab Results  Component Value Date   NA 136 07/09/2016   K 4.2 07/09/2016   CL 102 07/09/2016   CO2 26 07/09/2016   BUN 16 07/09/2016   CREATININE 0.98 07/09/2016   PROT 8.2 (H) 07/08/2016   ALBUMIN 4.0 07/08/2016   BILITOT 0.8 07/08/2016   ALKPHOS 85 07/08/2016   AST 36 07/08/2016   ALT 21 07/08/2016  . Lab Results  Component Value Date   HGBA1C 9.0 (H) 07/08/2016     Total Time in preparing paper work, data evaluation and todays exam - 71 minutes  Lala Lund M.D on 07/09/2016 at 8:35 AM  [facility]   Office  [phone]

## 2016-07-09 NOTE — Discharge Instructions (Signed)
Follow with Primary [facility] in 3 days   Get CBC, CMP, 2 view Chest X ray checked  by Primary MD in 3 days ( we routinely change or add medications that can affect your baseline labs and fluid status, therefore we recommend that you get the mentioned basic workup next visit with your PCP, your PCP may decide not to get them or add new tests based on their clinical decision)  Activity: As tolerated with Full fall precautions use walker/cane & assistance as needed  Disposition Home    Diet:   Diet heart healthy/carb modified Fluid restriction: 1500 mL Fluid/day    Accuchecks 4 times/day, Once in AM empty stomach and then before each meal. Log in all results and show them to your Prim.MD in 3 days. If any glucose reading is under 80 or above 300 call your Prim MD immidiately. Follow Low glucose instructions for glucose under 80 as instructed.  For Heart failure patients - Check your Weight same time everyday, if you gain over 2 pounds, or you develop in leg swelling, experience more shortness of breath or chest pain, call your Primary MD immediately. Follow Cardiac Low Salt Diet and 1.5 lit/day fluid restriction.  On your next visit with your primary care physician please Get Medicines reviewed and adjusted.  Please request your Prim.MD to go over all Hospital Tests and Procedure/Radiological results at the follow up, please get all Hospital records sent to your Prim MD by signing hospital release before you go home.  If you experience worsening of your admission symptoms, develop shortness of breath, life threatening emergency, suicidal or homicidal thoughts you must seek medical attention immediately by calling 911 or calling your MD immediately  if symptoms less severe.  You Must read complete instructions/literature along with all the possible adverse reactions/side effects for all the Medicines you take and that have been prescribed to you. Take any new  Medicines after you have completely understood and accpet all the possible adverse reactions/side effects.   Do not drive, operate heavy machinery, perform activities at heights, swimming or participation in water activities or provide baby sitting services if your were admitted for syncope or siezures until you have seen by Primary MD or a Neurologist and advised to do so again.  Do not drive when taking Pain medications.    Do not take more than prescribed Pain, Sleep and Anxiety Medications  Special Instructions: If you have smoked or chewed Tobacco  in the last 2 yrs please stop smoking, stop any regular Alcohol  and or any Recreational drug use.  Wear Seat belts while driving.   Please note  You were cared for by a hospitalist during your hospital stay. If you have any questions about your discharge medications or the care you received while you were in the hospital after you are discharged, you can call the unit and asked to speak with the hospitalist on call if the hospitalist that took care of you is not available. Once you are discharged, your primary care physician will handle any further medical issues. Please note that NO REFILLS for any discharge medications will be authorized once you are discharged, as it is imperative that you return to your primary care physician (or establish a relationship with a primary care physician if you do not have one) for your aftercare needs so that they can reassess your need for medications and monitor your lab values.

## 2016-07-09 NOTE — Progress Notes (Signed)
Discharge instructions gone over with patient and family. Verbalized understanding. Printed prescriptions given to patient. IV removed. Patient tolerated procedure well.

## 2016-07-11 LAB — GLUCOSE, CAPILLARY: Glucose-Capillary: 226 mg/dL — ABNORMAL HIGH (ref 65–99)

## 2016-07-15 ENCOUNTER — Telehealth: Payer: Self-pay

## 2016-07-15 NOTE — Telephone Encounter (Signed)
Pt received a letter to be triaged. Please call [phone]

## 2016-07-20 ENCOUNTER — Ambulatory Visit (INDEPENDENT_AMBULATORY_CARE_PROVIDER_SITE_OTHER): Payer: Medicare Other | Admitting: Physician Assistant

## 2016-07-20 ENCOUNTER — Telehealth: Payer: Self-pay | Admitting: *Deleted

## 2016-07-20 ENCOUNTER — Other Ambulatory Visit (HOSPITAL_COMMUNITY)
Admission: RE | Admit: 2016-07-20 | Discharge: 2016-07-20 | Disposition: A | Discharge status: Home / Self Care | Payer: Medicare Other | Source: Ambulatory Visit | Attending: Physician Assistant | Admitting: Physician Assistant

## 2016-07-20 ENCOUNTER — Encounter: Payer: Self-pay | Admitting: Physician Assistant

## 2016-07-20 ENCOUNTER — Encounter: Payer: Self-pay | Admitting: *Deleted

## 2016-07-20 VITALS — BP 160/90 | HR 95 | Ht 63.0 in | Wt 147.0 lb

## 2016-07-20 DIAGNOSIS — E785 Hyperlipidemia, unspecified: Secondary | ICD-10-CM

## 2016-07-20 DIAGNOSIS — Z8249 Family history of ischemic heart disease and other diseases of the circulatory system: Secondary | ICD-10-CM | POA: Insufficient documentation

## 2016-07-20 DIAGNOSIS — I5032 Chronic diastolic (congestive) heart failure: Secondary | ICD-10-CM | POA: Insufficient documentation

## 2016-07-20 DIAGNOSIS — I10 Essential (primary) hypertension: Secondary | ICD-10-CM | POA: Diagnosis not present

## 2016-07-20 DIAGNOSIS — R079 Chest pain, unspecified: Secondary | ICD-10-CM | POA: Diagnosis not present

## 2016-07-20 DIAGNOSIS — Z720 Tobacco use: Secondary | ICD-10-CM | POA: Diagnosis not present

## 2016-07-20 DIAGNOSIS — E119 Type 2 diabetes mellitus without complications: Secondary | ICD-10-CM

## 2016-07-20 LAB — BASIC METABOLIC PANEL
Anion gap: 7 (ref 5–15)
BUN: 13 mg/dL (ref 6–20)
CALCIUM: 9 mg/dL (ref 8.9–10.3)
CHLORIDE: 99 mmol/L — AB (ref 101–111)
CO2: 27 mmol/L (ref 22–32)
CREATININE: 0.73 mg/dL (ref 0.44–1.00)
GFR calc Af Amer: >60 mL/min (ref >60)
GFR calc non Af Amer: >60 mL/min (ref >60)
Glucose, Bld: 252 mg/dL — ABNORMAL HIGH (ref 65–99)
Potassium: 3.5 mmol/L (ref 3.5–5.1)
Sodium: 133 mmol/L — ABNORMAL LOW (ref 135–145)

## 2016-07-20 MED ORDER — CARVEDILOL 12.5 MG PO TABS: 12.5000 mg | ORAL_TABLET | Freq: Two times a day (BID) | ORAL | 3 refills | Status: DC

## 2016-07-20 MED ORDER — POTASSIUM CHLORIDE CRYS ER 20 MEQ PO TBCR: 20.0000 meq | EXTENDED_RELEASE_TABLET | Freq: Every day | ORAL | 3 refills | Status: DC

## 2016-07-20 NOTE — Patient Instructions (Signed)
Your physician recommends that you schedule a follow-up appointment to be determined by test.    Your physician has recommended you make the following change in your medication:  Increase Coreg to 12.5 mg Two Times Daily   Your physician has requested that you have en exercise stress myoview. For further information please visit [email or web address]. Please follow instruction sheet, as given.   If you need a refill on your cardiac medications before your next appointment, please call your pharmacy.  Thank you for choosing [facility]!

## 2016-07-20 NOTE — Telephone Encounter (Signed)
Called pt to triage. She recently went to the hospital and had fluid around her heart. Rose Greer she has cardiology appt today. I told her to call back when cardiologist says OK to schedule colonoscopy and she said that she will.

## 2016-07-20 NOTE — Telephone Encounter (Signed)
-----   Message from Imogene Burn, PA-C sent at 07/20/2016  2:25 PM EDT ----- Glucose high at 252. Renal function stable. Potassium trending down. Increase potassium to 20 mEq daily.

## 2016-07-20 NOTE — Progress Notes (Signed)
Cardiology Office Note    Date:  07/20/2016   ID:  Rose Greer, DOB 1955-10-09, MRN [id number]  PCP:  [facility]  Cardiologist: New  Chief Complaint  Patient presents with  . New Patient (Initial Visit)    History of Present Illness:  Rose Greer is a 61 y.o. female  Referred to [place] by Dr. Candiss Norse with history of hypertension, GERD, diabetes mellitus, HLD who was discharged from the hospital 07/09/16 after an admission with respiratory failure felt secondary to pulmonary edema caused by hypertensive crisis and bronchitis. Was felt she had acute on chronic diastolic CHF EF 36%. She improved with IV Lasix, Coreg, hydralazine, Imdur. Troponins negative 3.EKG from 07/08/16 normal sinus rhythm with LVH and some reciprocal T-wave inversion laterally.  2-3 months ago had 2 episodes of chest pain while sleeping described as tightness and stabbing pain eased after 45 min. None since. Walks ~ 1 mile 3x/wk without chest pain but does get short of breath and has to stop and rest. Still smoking down to 2 cigs/day. Mother with CABG and CHF in 37's, sister has appt for her heart to get checked soon.Blood pressure is high today in the office and she is brought readings from home and some are stable and others are elevated. She seems to be following a low sodium diet. No further edema or dyspnea at rest.   Past Medical History:  Diagnosis Date  . Brain aneurysm   . Diabetes mellitus without complication ([place])   . GERD (gastroesophageal reflux disease)   . High cholesterol   . Hypertension     Past Surgical History:  Procedure Laterality Date  . BACK SURGERY      Current Medications: Outpatient Medications Prior to Visit  Medication Sig Dispense Refill  . aspirin EC 81 MG tablet Take 81 mg by mouth daily.    . citalopram (CELEXA) 40 MG tablet Take 40 mg by mouth daily.    . furosemide (LASIX) 40 MG tablet Take 1 tablet (40 mg total) by mouth daily. 30 tablet 0  .  glipiZIDE (GLUCOTROL) 5 MG tablet Take 1 tablet (5 mg total) by mouth 2 (two) times daily. 60 tablet 0  . hydrALAZINE (APRESOLINE) 50 MG tablet Take 1 tablet (50 mg total) by mouth every 8 (eight) hours. 90 tablet 0  . isosorbide mononitrate (IMDUR) 30 MG 24 hr tablet Take 1 tablet (30 mg total) by mouth daily. 30 tablet 0  . LANTUS SOLOSTAR 100 UNIT/ML Solostar Pen Inject 12 Units into the skin daily.    Marland Kitchen levofloxacin (LEVAQUIN) 750 MG tablet Take 1 tablet (750 mg total) by mouth daily. 3 tablet 0  . mirtazapine (REMERON) 45 MG tablet Take 45 mg by mouth at bedtime.    Marland Kitchen omeprazole (PRILOSEC) 20 MG capsule Take 20 mg by mouth daily.    . potassium chloride (K-DUR) 10 MEQ tablet Take 1 tablet (10 mEq total) by mouth daily. 30 tablet 0  . pravastatin (PRAVACHOL) 40 MG tablet Take 40 mg by mouth daily.    . risperiDONE (RISPERDAL) 2 MG tablet Take 2 mg by mouth at bedtime.    . carvedilol (COREG) 6.25 MG tablet Take 1 tablet (6.25 mg total) by mouth 2 (two) times daily with a meal. 60 tablet 0   No facility-administered medications prior to visit.      Allergies:   Benazepril and Penicillins   Social History   Social History  . Marital status: Single  Spouse name: N/A  . Number of children: N/A  . Years of education: N/A   Social History Main Topics  . Smoking status: Current Some Day Smoker    Packs/day: 0.25    Types: Cigarettes  . Smokeless tobacco: Never Used  . Alcohol use No  . Drug use: No  . Sexual activity: Not Asked   Other Topics Concern  . None   Social History Narrative  . None     Family History:  The patient's   family history includes Cancer in her father; Diabetes in her mother and sister; Hypertension in her brother and mother.   ROS:   Please see the history of present illness.    Review of Systems  Constitution: Negative.  HENT: Negative.   Eyes: Negative.   Cardiovascular: Positive for dyspnea on exertion.  Respiratory: Negative.     Hematologic/Lymphatic: Negative.   Musculoskeletal: Negative.  Negative for joint pain.  Gastrointestinal: Negative.   Genitourinary: Negative.   Neurological: Negative.    All other systems reviewed and are negative.   PHYSICAL EXAM:   VS:  BP (!) 160/90   Pulse 95   Ht 5\' 3"  (1.6 m)   Wt 147 lb (66.7 kg)   SpO2 95%   BMI 26.04 kg/m   Physical Exam  GEN: Well nourished, well developed, in no acute distress  Neck: no JVD, carotid bruits, or masses Cardiac:RRR; Positive S4, 2/6 systolic murmur at the left sternal border Respiratory:  clear to auscultation bilaterally, normal work of breathing GI: soft, nontender, nondistended, + BS Ext: without cyanosis, clubbing, or edema, Good distal pulses bilaterally Psych: euthymic mood, full affect  Wt Readings from Last 3 Encounters:  07/20/16 147 lb (66.7 kg)  07/07/16 148 lb (67.1 kg)  06/24/16 148 lb (67.1 kg)      Studies/Labs Reviewed:   EKG:  EKG is not ordered today.  The ekg reviewed from4/6/18 normal sinus rhythm with LVH and some reciprocal T-wave inversion laterally.  Recent Labs: 07/07/2016: B Natriuretic Peptide 109.0 07/08/2016: ALT 21; Hemoglobin 14.1; Platelets 200 07/09/2016: BUN 16; Creatinine, Ser 0.98; Potassium 4.2; Sodium 136   Lipid Panel No results found for: CHOL, TRIG, HDL, CHOLHDL, VLDL, LDLCALC, LDLDIRECT  Additional studies/ records that were reviewed today include:   2-D echo 07/08/16 TTE   Left ventricle: The cavity size was normal. Wall thickness was   increased in a pattern of mild LVH. Systolic function was at the   lower limits of normal. The estimated ejection fraction was 50%.   Diffuse hypokinesis. Doppler parameters are consistent with   abnormal left ventricular relaxation (grade 1 diastolic   dysfunction). - Left atrium: The atrium was mildly dilated.      ASSESSMENT:    1. Type 2 diabetes mellitus without complication, without long-term current use of insulin ([place])   2. Chronic  diastolic CHF (congestive heart failure) ([place])   3. Essential hypertension   4. Chest pain, unspecified type   5. Family history of early CAD   25. Hyperlipidemia, unspecified hyperlipidemia type   7. Tobacco abuse      PLAN:  In order of problems listed above:  Chronic diastolic CHF compensated on current medications 2-D echo 07/08/16 normal LVEF 50% with diffuse hypokinesis grade 1 DD. Will increase carvedilol for better blood pressure control  Essential hypertension blood pressure is still elevated and has been at home on occasion. Increase carvedilol to 12.5 mg twice a day  Chest pain somewhat atypical but multiple  cardiac risk factors for CAD. Recommend exercise Myoview. Discuss patient detail with Dr. Domenic Polite. If stress test is normal she can follow-up with primary care for tight blood pressure control. We will see her when necessary. If abnormal stress test follow-up with cardiologist.  Family history of CAD  Diabetes mellitus on insulin  HLD on Pravachol  Tobacco abuse smoking cessation recommended    Medication Adjustments/Labs and Tests Ordered: Current medicines are reviewed at length with the patient today.  Concerns regarding medicines are outlined above.  Medication changes, Labs and Tests ordered today are listed in the Patient Instructions below. Patient Instructions  Your physician recommends that you schedule a follow-up appointment to be determined by test.    Your physician has recommended you make the following change in your medication:  Increase Coreg to 12.5 mg Two Times Daily   Your physician has requested that you have en exercise stress myoview. For further information please visit [email or web address]. Please follow instruction sheet, as given.   If you need a refill on your cardiac medications before your next appointment, please call your pharmacy.  Thank you for choosing [facility]!       Signed, Ermalinda Barrios, PA-C  07/20/2016  1:05 PM    [facility] [place], [place], [place]  [address] Phone: [phone]; Fax: [phone]

## 2016-07-26 ENCOUNTER — Telehealth: Payer: Self-pay

## 2016-07-26 NOTE — Telephone Encounter (Signed)
I spoke with pt, she will HOLD Coreg for stress myoview tomorrow, LM for [employer] RN in Stress Lab

## 2016-07-27 ENCOUNTER — Encounter (HOSPITAL_BASED_OUTPATIENT_CLINIC_OR_DEPARTMENT_OTHER)
Admission: RE | Admit: 2016-07-27 | Discharge: 2016-07-27 | Disposition: A | Discharge status: Home / Self Care | Payer: Medicare Other | Source: Ambulatory Visit | Attending: Physician Assistant | Admitting: Physician Assistant

## 2016-07-27 ENCOUNTER — Encounter (HOSPITAL_COMMUNITY)
Admission: RE | Admit: 2016-07-27 | Discharge: 2016-07-27 | Disposition: A | Discharge status: Home / Self Care | Payer: Medicare Other | Source: Ambulatory Visit | Attending: Physician Assistant | Admitting: Physician Assistant

## 2016-07-27 ENCOUNTER — Encounter (HOSPITAL_COMMUNITY): Payer: Self-pay

## 2016-07-27 DIAGNOSIS — R079 Chest pain, unspecified: Secondary | ICD-10-CM | POA: Diagnosis present

## 2016-07-27 DIAGNOSIS — I510 Cardiac septal defect, acquired: Secondary | ICD-10-CM | POA: Insufficient documentation

## 2016-07-27 LAB — NM MYOCAR MULTI W/SPECT W/WALL MOTION / EF
CHL CUP NUCLEAR SDS: 1
CSEPED: 5 min
CSEPPHR: 131 {beats}/min
Estimated workload: 7 METS
Exercise duration (sec): 57 s
LHR: 0.11
LV dias vol: 72 mL (ref 46–106)
LV sys vol: 36 mL
MPHR: 158 {beats}/min
NUC STRESS TID: 1.05
Percent HR: 82 %
RPE: 15
Rest HR: 69 {beats}/min
SRS: 1
SSS: 2

## 2016-07-27 MED ORDER — TECHNETIUM TC 99M TETROFOSMIN IV KIT
10.0000 | PACK | Freq: Once | INTRAVENOUS | Status: AC | PRN
  Administered 2016-07-27: 9.8 via INTRAVENOUS

## 2016-07-27 MED ORDER — REGADENOSON 0.4 MG/5ML IV SOLN
INTRAVENOUS | Status: AC
  Administered 2016-07-27: 0.4 mg via INTRAVENOUS
  Filled 2016-07-27: qty 5

## 2016-07-27 MED ORDER — SODIUM CHLORIDE 0.9% FLUSH
INTRAVENOUS | Status: AC
  Administered 2016-07-27: 10 mL via INTRAVENOUS
  Filled 2016-07-27: qty 10

## 2016-07-27 MED ORDER — TECHNETIUM TC 99M TETROFOSMIN IV KIT
30.0000 | PACK | Freq: Once | INTRAVENOUS | Status: AC | PRN
  Administered 2016-07-27: 30 via INTRAVENOUS

## 2016-07-29 ENCOUNTER — Telehealth: Payer: Self-pay | Admitting: Physician Assistant

## 2016-07-29 NOTE — Telephone Encounter (Signed)
Pt has still been having  steady high BP readings per her home health nurse Star, today was her first good reading, if any changes needs to be made to the meds please give Star a call @ [phone]

## 2016-07-29 NOTE — Telephone Encounter (Signed)
BP 140/80  Will forward to provider

## 2016-08-01 MED ORDER — CARVEDILOL 25 MG PO TABS: 25.0000 mg | ORAL_TABLET | Freq: Two times a day (BID) | ORAL | 3 refills | Status: DC

## 2016-08-01 NOTE — Telephone Encounter (Signed)
If BP still high Increase coreg 25 mg bid. Should have pulse and bp check after 1 week

## 2016-08-01 NOTE — Telephone Encounter (Signed)
Star from [facility] will have pt increase coreg to 25 mg twice a day and check her BP and HR in 1 week

## 2016-09-01 ENCOUNTER — Emergency Department (HOSPITAL_COMMUNITY)
Admission: EM | Admit: 2016-09-01 | Discharge: 2016-09-01 | Disposition: A | Discharge status: Home / Self Care | Payer: Medicare Other | Attending: Emergency Medicine | Admitting: Emergency Medicine

## 2016-09-01 ENCOUNTER — Emergency Department (HOSPITAL_COMMUNITY): Payer: Medicare Other

## 2016-09-01 ENCOUNTER — Encounter (HOSPITAL_COMMUNITY): Payer: Self-pay | Admitting: Emergency Medicine

## 2016-09-01 DIAGNOSIS — Z7984 Long term (current) use of oral hypoglycemic drugs: Secondary | ICD-10-CM | POA: Insufficient documentation

## 2016-09-01 DIAGNOSIS — R531 Weakness: Secondary | ICD-10-CM | POA: Diagnosis present

## 2016-09-01 DIAGNOSIS — Z79899 Other long term (current) drug therapy: Secondary | ICD-10-CM | POA: Insufficient documentation

## 2016-09-01 DIAGNOSIS — E119 Type 2 diabetes mellitus without complications: Secondary | ICD-10-CM | POA: Diagnosis not present

## 2016-09-01 DIAGNOSIS — Z7982 Long term (current) use of aspirin: Secondary | ICD-10-CM | POA: Insufficient documentation

## 2016-09-01 DIAGNOSIS — I110 Hypertensive heart disease with heart failure: Secondary | ICD-10-CM | POA: Insufficient documentation

## 2016-09-01 DIAGNOSIS — R05 Cough: Secondary | ICD-10-CM | POA: Diagnosis not present

## 2016-09-01 DIAGNOSIS — I5033 Acute on chronic diastolic (congestive) heart failure: Secondary | ICD-10-CM | POA: Diagnosis not present

## 2016-09-01 DIAGNOSIS — F17210 Nicotine dependence, cigarettes, uncomplicated: Secondary | ICD-10-CM | POA: Insufficient documentation

## 2016-09-01 DIAGNOSIS — R197 Diarrhea, unspecified: Secondary | ICD-10-CM | POA: Diagnosis not present

## 2016-09-01 DIAGNOSIS — R0602 Shortness of breath: Secondary | ICD-10-CM | POA: Insufficient documentation

## 2016-09-01 DIAGNOSIS — R300 Dysuria: Secondary | ICD-10-CM | POA: Diagnosis not present

## 2016-09-01 DIAGNOSIS — R1013 Epigastric pain: Secondary | ICD-10-CM | POA: Diagnosis not present

## 2016-09-01 DIAGNOSIS — R5383 Other fatigue: Secondary | ICD-10-CM | POA: Insufficient documentation

## 2016-09-01 LAB — CBC WITH DIFFERENTIAL/PLATELET
BASOS ABS: 0 10*3/uL (ref 0.0–0.1)
Basophils Relative: 1 %
Eosinophils Absolute: 0.1 10*3/uL (ref 0.0–0.7)
Eosinophils Relative: 2 %
HCT: 49.9 % — ABNORMAL HIGH (ref 36.0–46.0)
Hemoglobin: 16.5 g/dL — ABNORMAL HIGH (ref 12.0–15.0)
LYMPHS ABS: 3.8 10*3/uL (ref 0.7–4.0)
LYMPHS PCT: 56 %
MCH: 28.6 pg (ref 26.0–34.0)
MCHC: 33.1 g/dL (ref 30.0–36.0)
MCV: 86.5 fL (ref 78.0–100.0)
MONO ABS: 0.6 10*3/uL (ref 0.1–1.0)
Monocytes Relative: 10 %
NEUTROS ABS: 2.2 10*3/uL (ref 1.7–7.7)
Neutrophils Relative %: 33 %
Platelets: 182 10*3/uL (ref 150–400)
RBC: 5.77 MIL/uL — AB (ref 3.87–5.11)
RDW: 14.3 % (ref 11.5–15.5)
WBC: 6.7 10*3/uL (ref 4.0–10.5)

## 2016-09-01 LAB — COMPREHENSIVE METABOLIC PANEL
ALBUMIN: 4.5 g/dL (ref 3.5–5.0)
ALT: 24 U/L (ref 14–54)
AST: 39 U/L (ref 15–41)
Alkaline Phosphatase: 84 U/L (ref 38–126)
Anion gap: 15 (ref 5–15)
BUN: 8 mg/dL (ref 6–20)
CO2: 23 mmol/L (ref 22–32)
CREATININE: 0.77 mg/dL (ref 0.44–1.00)
Calcium: 9.9 mg/dL (ref 8.9–10.3)
Chloride: 100 mmol/L — ABNORMAL LOW (ref 101–111)
GFR calc non Af Amer: >60 mL/min (ref >60)
GLUCOSE: 94 mg/dL (ref 65–99)
Potassium: 4.1 mmol/L (ref 3.5–5.1)
SODIUM: 138 mmol/L (ref 135–145)
Total Bilirubin: 0.8 mg/dL (ref 0.3–1.2)
Total Protein: 9.5 g/dL — ABNORMAL HIGH (ref 6.5–8.1)

## 2016-09-01 LAB — BRAIN NATRIURETIC PEPTIDE: B Natriuretic Peptide: 57 pg/mL (ref 0.0–100.0)

## 2016-09-01 LAB — TROPONIN I

## 2016-09-01 LAB — URINALYSIS, ROUTINE W REFLEX MICROSCOPIC
Bilirubin Urine: NEGATIVE
GLUCOSE, UA: NEGATIVE mg/dL
Hgb urine dipstick: NEGATIVE
Ketones, ur: 20 mg/dL — AB
LEUKOCYTES UA: NEGATIVE
Nitrite: NEGATIVE
PH: 8 (ref 5.0–8.0)
Protein, ur: NEGATIVE mg/dL
Specific Gravity, Urine: 1.01 (ref 1.005–1.030)

## 2016-09-01 LAB — LIPASE, BLOOD: Lipase: 30 U/L (ref 11–51)

## 2016-09-01 LAB — D-DIMER, QUANTITATIVE: D-Dimer, Quant: 1.93 ug/mL-FEU — ABNORMAL HIGH (ref 0.00–0.50)

## 2016-09-01 MED ORDER — NITROGLYCERIN 0.4 MG SL SUBL
0.4000 mg | SUBLINGUAL_TABLET | SUBLINGUAL | Status: DC | PRN
  Administered 2016-09-01: 0.4 mg via SUBLINGUAL
  Filled 2016-09-01: qty 1

## 2016-09-01 MED ORDER — SODIUM CHLORIDE 0.9 % IV BOLUS (SEPSIS): 500.0000 mL | Freq: Once | INTRAVENOUS | Status: DC

## 2016-09-01 MED ORDER — IOPAMIDOL (ISOVUE-370) INJECTION 76%
75.0000 mL | Freq: Once | INTRAVENOUS | Status: AC | PRN
  Administered 2016-09-01: 75 mL via INTRAVENOUS

## 2016-09-01 MED ORDER — ASPIRIN 81 MG PO CHEW
324.0000 mg | CHEWABLE_TABLET | Freq: Once | ORAL | Status: AC
  Administered 2016-09-01: 324 mg via ORAL
  Filled 2016-09-01: qty 4

## 2016-09-01 MED ORDER — SODIUM CHLORIDE 0.9 % IV BOLUS (SEPSIS)
1000.0000 mL | Freq: Once | INTRAVENOUS | Status: AC
  Administered 2016-09-01: 1000 mL via INTRAVENOUS

## 2016-09-01 NOTE — Discharge Instructions (Signed)
Take your usual prescriptions as previously directed.  Call your regular medical doctor today to schedule a follow up appointment within the next 2 days.  Return to the Emergency Department immediately sooner if worsening.  ° °

## 2016-09-01 NOTE — ED Notes (Signed)
Patient ambulated in the hall.  Patient was able to walk with little assistance.  Denies any dizziness.

## 2016-09-01 NOTE — ED Provider Notes (Signed)
[facility] DEPT Provider Note   CSN: [id number] Arrival date & time: 09/01/16  1225     History   Chief Complaint Chief Complaint  Patient presents with  . Hypertension    HPI Rose Greer is a 61 y.o. female.  HPI  61 year old female with a history of diabetes, hypertension, and hypercholesterolemia with a recent diagnosis of acute on chronic diastolic CHF with an EF of 50% presents with weakness. She feels diffusely weak and fatigued. She states this has been going on for the last 1 week. She has not been or drink much due to no appetite. No vomiting. She has had 3 or 4 loose stools per day. Denies headache, chest pain. She has been short of breath, especially with laying flat. Some cough. No fevers. She's been having urinary discomfort ever since being put on a new medicine last month at discharge that she does not room in the name of. Denies any focal weakness or numbness. No chest pain but has been having epigastric discomfort coming and going. Nothing specific makes it come and go.  Past Medical History:  Diagnosis Date  . Brain aneurysm   . Diabetes mellitus without complication ([place])   . GERD (gastroesophageal reflux disease)   . High cholesterol   . Hypertension     Patient Active Problem List   Diagnosis Date Noted  . Chronic diastolic CHF (congestive heart failure) ([place]) 07/20/2016  . HTN (hypertension) 07/20/2016  . Chest pain 07/20/2016  . Family history of early CAD 07/20/2016  . Hyperlipidemia 07/20/2016  . Tobacco abuse 07/20/2016  . Acute systolic congestive heart failure ([place])   . Hypertensive urgency 07/07/2016  . Pulmonary edema 07/07/2016  . DM (diabetes mellitus) ([place]) 07/07/2016    Past Surgical History:  Procedure Laterality Date  . BACK SURGERY      OB History    No data available       Home Medications    Prior to Admission medications   Medication Sig Start Date End Date Taking? Authorizing Provider  aspirin EC 81 MG tablet  Take 81 mg by mouth daily.   Yes [provider]  carvedilol (COREG) 25 MG tablet Take 1 tablet (25 mg total) by mouth 2 (two) times daily. 08/01/16 10/30/16 Yes Imogene Burn, PA-C  citalopram (CELEXA) 40 MG tablet Take 40 mg by mouth daily. 06/02/16  Yes [provider]  furosemide (LASIX) 40 MG tablet Take 1 tablet (40 mg total) by mouth daily. 07/09/16  Yes Thurnell Lose, MD  glipiZIDE (GLUCOTROL) 5 MG tablet Take 1 tablet (5 mg total) by mouth 2 (two) times daily. 07/09/16  Yes Thurnell Lose, MD  hydrALAZINE (APRESOLINE) 50 MG tablet Take 1 tablet (50 mg total) by mouth every 8 (eight) hours. 07/09/16  Yes Thurnell Lose, MD  isosorbide mononitrate (IMDUR) 30 MG 24 hr tablet Take 1 tablet (30 mg total) by mouth daily. 07/09/16  Yes Thurnell Lose, MD  LEVEMIR FLEXTOUCH 100 UNIT/ML Pen Inject 16 Units into the muscle daily. 06/21/16  Yes [provider]  levofloxacin (LEVAQUIN) 750 MG tablet Take 1 tablet (750 mg total) by mouth daily. 07/09/16  Yes Thurnell Lose, MD  mirtazapine (REMERON) 45 MG tablet Take 45 mg by mouth at bedtime. 06/02/16  Yes [provider]  omeprazole (PRILOSEC) 20 MG capsule Take 20 mg by mouth daily. 06/02/16  Yes [provider]  potassium chloride SA (K-DUR,KLOR-CON) 20 MEQ tablet Take 1 tablet (20 mEq total)  by mouth daily. 07/20/16  Yes Imogene Burn, PA-C  pravastatin (PRAVACHOL) 40 MG tablet Take 40 mg by mouth daily. 06/02/16  Yes [provider]  risperiDONE (RISPERDAL) 2 MG tablet Take 2 mg by mouth at bedtime. 06/02/16  Yes [provider]  LANTUS SOLOSTAR 100 UNIT/ML Solostar Pen Inject 12 Units into the skin daily. 06/02/16   [provider]    Family History Family History  Problem Relation Age of Onset  . Diabetes Mother   . Hypertension Mother   . Cancer Father        lung   . Diabetes Sister        4 sisters ( hypertension )  . Hypertension Brother        4 brothers (  hypertension)     Social History Social History  Substance Use Topics  . Smoking status: Current Some Day Smoker    Packs/day: 0.25    Types: Cigarettes  . Smokeless tobacco: Never Used  . Alcohol use No     Allergies   Benazepril and Penicillins   Review of Systems Review of Systems  Constitutional: Positive for fatigue. Negative for fever.  Respiratory: Positive for cough and shortness of breath.   Cardiovascular: Negative for chest pain and leg swelling.  Gastrointestinal: Positive for abdominal pain and diarrhea. Negative for nausea and vomiting.  Genitourinary: Positive for dysuria.  Neurological: Positive for weakness. Negative for numbness and headaches.  All other systems reviewed and are negative.    Physical Exam Updated Vital Signs BP (!) 186/96   Pulse 84   Temp 98.1 F (36.7 C) (Oral)   Resp 19   Ht 5\' 3"  (1.6 m)   Wt 64.4 kg (142 lb)   SpO2 98%   BMI 25.15 kg/m   Physical Exam  Constitutional: She is oriented to person, place, and time. She appears well-developed and well-nourished.  HENT:  Head: Normocephalic and atraumatic.  Right Ear: External ear normal.  Left Ear: External ear normal.  Nose: Nose normal.  Eyes: Right eye exhibits no discharge. Left eye exhibits no discharge.  Cardiovascular: Normal rate, regular rhythm and normal heart sounds.   Pulmonary/Chest: Effort normal and breath sounds normal. She has no wheezes. She has no rales.  Abdominal: Soft. There is no tenderness.  Musculoskeletal: She exhibits no edema.  Neurological: She is alert and oriented to person, place, and time.  CN 3-12 grossly intact. 5/5 strength in all 4 extremities. Grossly normal sensation. Normal finger to nose.   Skin: Skin is warm and dry. She is not diaphoretic.  Nursing note and vitals reviewed.    ED Treatments / Results  Labs (all labs ordered are listed, but only abnormal results are displayed) Labs Reviewed  COMPREHENSIVE METABOLIC PANEL -  Abnormal; Notable for the following:       Result Value   Chloride 100 (*)    Total Protein 9.5 (*)    All other components within normal limits  CBC WITH DIFFERENTIAL/PLATELET - Abnormal; Notable for the following:    RBC 5.77 (*)    Hemoglobin 16.5 (*)    HCT 49.9 (*)    All other components within normal limits  D-DIMER, QUANTITATIVE (NOT AT [facility]) - Abnormal; Notable for the following:    D-Dimer, Quant 1.93 (*)    All other components within normal limits  LIPASE, BLOOD  TROPONIN I  BRAIN NATRIURETIC PEPTIDE  URINALYSIS, ROUTINE W REFLEX MICROSCOPIC  TROPONIN I    EKG  EKG Interpretation  Date/Time:  Thursday Sep 01 2016 12:40:26 EDT Ventricular Rate:  67 PR Interval:    QRS Duration: 74 QT Interval:  449 QTC Calculation: 474 R Axis:   58 Text Interpretation:  Sinus rhythm Probable LVH with secondary repol abnrm Baseline wander in lead(s) III no significant change since July 07 2016 Confirmed by Sherwood Gambler [phone]) on 09/01/2016 2:15:42 PM       Radiology Dg Chest 2 View  Result Date: 09/01/2016 CLINICAL DATA:  Cough and fever for 1 week EXAM: CHEST  2 VIEW COMPARISON:  July 07, 2016 FINDINGS: Lungs are clear. Heart size and pulmonary vascularity are normal. No adenopathy. There is aortic atherosclerosis. No bone lesions. IMPRESSION: No edema or consolidation.  There is aortic atherosclerosis. Electronically Signed   By: Lowella Grip III M.D.   On: 09/01/2016 13:45    Procedures Procedures (including critical care time)  Medications Ordered in ED Medications  nitroGLYCERIN (NITROSTAT) SL tablet 0.4 mg (0.4 mg Sublingual Given 09/01/16 1521)  aspirin chewable tablet 324 mg (324 mg Oral Given 09/01/16 1438)  sodium chloride 0.9 % bolus 1,000 mL (1,000 mLs Intravenous New Bag/Given 09/01/16 1438)  iopamidol (ISOVUE-370) 76 % injection 75 mL (75 mLs Intravenous Contrast Given 09/01/16 1540)     Initial Impression / Assessment and Plan / ED Course  I have  reviewed the triage vital signs and the nursing notes.  Pertinent labs & imaging results that were available during my care of the patient were reviewed by me and considered in my medical decision making (see chart for details).     Patient has diffuse weakness but no focal findings. Troponin negative after one week of weakness and some shorts of breath with cough. Chest x-ray negative. Mild hemoconcentration so she was given IV fluids as I think fluid overload is not currently present. However her renal function is normal. Given the shortness of breath and relatively recent admission, d-dimer sent and is positive. She was offered food but refused to eat. She is ambulating normally. No signs of stroke. Has hypertension but this does not seem to be causing any focal findings such as stroke, MI, ACS or kidney failure. Will get CTA to r/o PE. If negative and if second troponin negative, I think she can be discharged home for outpatient workup. Encouraged to increase fluid/po intake. No nausea/vomiting. Care to Dr. Thurnell Garbe.  Final Clinical Impressions(s) / ED Diagnoses   Final diagnoses:  None    New Prescriptions New Prescriptions   No medications on file     Sherwood Gambler, MD 09/01/16 1550

## 2016-09-01 NOTE — ED Provider Notes (Addendum)
Pt received at sign out with CT-A chest and 2nd troponin pending. Pt c/o generalized weakness/fatigue, epigastric pain, diarrhea for the past 1 week. Denies CP. Labs reassuring. Pt has ambulated with steady gait, easy resps, NAD, no complaints. Pt has tol PO well without N/V. No stooling while in the ED. Abd remains benign. VS per baseline. No clear indication for admission at this time. Dx and testing d/w pt.  Questions answered.  Verb understanding, agreeable to d/c home with outpt f/u.    Results for orders placed or performed during the hospital encounter of 09/01/16  Comprehensive metabolic panel  Result Value Ref Range   Sodium 138 135 - 145 mmol/L   Potassium 4.1 3.5 - 5.1 mmol/L   Chloride 100 (L) 101 - 111 mmol/L   CO2 23 22 - 32 mmol/L   Glucose, Bld 94 65 - 99 mg/dL   BUN 8 6 - 20 mg/dL   Creatinine, Ser 0.77 0.44 - 1.00 mg/dL   Calcium 9.9 8.9 - 10.3 mg/dL   Total Protein 9.5 (H) 6.5 - 8.1 g/dL   Albumin 4.5 3.5 - 5.0 g/dL   AST 39 15 - 41 U/L   ALT 24 14 - 54 U/L   Alkaline Phosphatase 84 38 - 126 U/L   Total Bilirubin 0.8 0.3 - 1.2 mg/dL   GFR calc non Af Amer >60 >60 mL/min   GFR calc Af Amer >60 >60 mL/min   Anion gap 15 5 - 15  Lipase, blood  Result Value Ref Range   Lipase 30 11 - 51 U/L  Troponin I  Result Value Ref Range   Troponin I <0.03 <0.03 ng/mL  CBC with Differential  Result Value Ref Range   WBC 6.7 4.0 - 10.5 K/uL   RBC 5.77 (H) 3.87 - 5.11 MIL/uL   Hemoglobin 16.5 (H) 12.0 - 15.0 g/dL   HCT 49.9 (H) 36.0 - 46.0 %   MCV 86.5 78.0 - 100.0 fL   MCH 28.6 26.0 - 34.0 pg   MCHC 33.1 30.0 - 36.0 g/dL   RDW 14.3 11.5 - 15.5 %   Platelets 182 150 - 400 K/uL   Neutrophils Relative % 33 %   Neutro Abs 2.2 1.7 - 7.7 K/uL   Lymphocytes Relative 56 %   Lymphs Abs 3.8 0.7 - 4.0 K/uL   Monocytes Relative 10 %   Monocytes Absolute 0.6 0.1 - 1.0 K/uL   Eosinophils Relative 2 %   Eosinophils Absolute 0.1 0.0 - 0.7 K/uL   Basophils Relative 1 %   Basophils  Absolute 0.0 0.0 - 0.1 K/uL  Urinalysis, Routine w reflex microscopic  Result Value Ref Range   Color, Urine YELLOW YELLOW   APPearance CLEAR CLEAR   Specific Gravity, Urine 1.010 1.005 - 1.030   pH 8.0 5.0 - 8.0   Glucose, UA NEGATIVE NEGATIVE mg/dL   Hgb urine dipstick NEGATIVE NEGATIVE   Bilirubin Urine NEGATIVE NEGATIVE   Ketones, ur 20 (A) NEGATIVE mg/dL   Protein, ur NEGATIVE NEGATIVE mg/dL   Nitrite NEGATIVE NEGATIVE   Leukocytes, UA NEGATIVE NEGATIVE  Brain natriuretic peptide  Result Value Ref Range   B Natriuretic Peptide 57.0 0.0 - 100.0 pg/mL  D-dimer, quantitative (not at [facility])  Result Value Ref Range   D-Dimer, Quant 1.93 (H) 0.00 - 0.50 ug/mL-FEU  Troponin I  Result Value Ref Range   Troponin I <0.03 <0.03 ng/mL     Ct Angio Chest Pe W/cm &/or Wo Cm Result Date: 09/01/2016  CLINICAL DATA:  61 year old presenting with mid chest pain and epigastric abdominal pain, generalized weakness and one-week history of shortness of breath. Elevated D-dimer. EXAM: CT ANGIOGRAPHY CHEST WITH CONTRAST TECHNIQUE: Multidetector CT imaging of the chest was performed using the standard protocol during bolus administration of intravenous contrast. Multiplanar CT image reconstructions and MIPs were obtained to evaluate the vascular anatomy. CONTRAST:  75 mL Isovue 370 IV. COMPARISON:  No prior CT. Chest x-ray earlier today and is correlated. FINDINGS: Cardiovascular: Contrast opacification of the pulmonary arteries is very good. No filling defects within either main pulmonary artery or their segmental branches in either lung to suggest pulmonary embolism. Heart enlarged with left atrial and mild left ventricular enlargement and left ventricular hypertrophy. No visible coronary atherosclerosis. No pericardial effusion. Mild atherosclerosis involving the aortic arch without evidence of aneurysm. Bovine aortic arch anatomy (left common carotid artery arises from the innominate artery).  Mediastinum/Nodes: No pathologically enlarged mediastinal, hilar or axillary lymph nodes. No mediastinal masses. Normal-appearing esophagus. Visualized thyroid gland unremarkable. Lungs/Pleura: Emphysematous changes throughout both lungs. Expected mild dependent atelectasis posteriorly in the lower lobes. Minimal scarring in the lingula. Pulmonary parenchyma otherwise clear. No pulmonary parenchymal nodules or masses. No evidence of interstitial lung disease. No pleural effusions. Central airways patent with mild to moderate bronchial wall thickening. Upper Abdomen: Visualized upper abdomen unremarkable for the early phase of enhancement. Musculoskeletal: Minimal degenerative disc disease and spondylosis involving the thoracic spine. Review of the MIP images confirms the above findings. IMPRESSION: 1. No evidence of pulmonary embolism. 2. Cardiomegaly with left atrial enlargement, mild left ventricular enlargement and left ventricular hypertrophy. 3. Mild aortic arch atherosclerosis. 4. COPD/emphysema.  No acute cardiopulmonary disease. Imaging findings of potential clinical significance: Aortic Atherosclerosis (ICD10-170.0) Electronically Signed   By: Evangeline Dakin M.D.   On: 09/01/2016 16:00        Francine Graven, DO 09/01/16 1659

## 2016-09-01 NOTE — ED Triage Notes (Signed)
Pt sent from her PCP's office for hypertensin.  Pt states she has been feeling weak, having diarrhea and epigastric pain x 4 days.

## 2016-09-01 NOTE — ED Notes (Signed)
Consumed approx 1/2 soda with no problems

## 2016-09-06 ENCOUNTER — Encounter: Payer: Self-pay | Admitting: Cardiology

## 2016-09-06 NOTE — Progress Notes (Signed)
Cardiology Office Note  Date: 09/07/2016   ID: Rose Greer, DOB Sep 16, 1955, MRN [id number]  PCP: [facility]  Evaluating Cardiologist: Rozann Lesches, MD   Chief Complaint  Patient presents with  . Cardiac follow-up    History of Present Illness: Rose Greer is a 61 y.o. female seen as a new patient by Ms. Bonnell Public PA-C back in April. This is my first meeting with her. I reviewed records and updated the chart. She has a history of hypertensive urgency with pulmonary edema and diastolic heart failure as of April, treated with diuretics and blood pressure control. She has undergone both cardiac structural and ischemic testing as detailed below.  She presents for a routine follow-up visit today. Reports compliance with her medications. Blood pressure is mildly elevated today. She does not report any orthopnea or leg swelling.  I reviewed her recent records. She was seen in the ER in late May complaining of weakness and fatigue, had been having diarrhea with poor appetite. No chest pain reported. Cardiac enzymes and BNP were normal. She did have an elevated d-dimer but chest CT was negative for PE. Lab work reviewed below.  Today we discussed her cardiac testing. Main goal at this point his blood pressure control, sodium restriction.  Past Medical History:  Diagnosis Date  . Brain aneurysm   . Essential hypertension    History of hypertensive urgency with pulmonary edema April 2018  . GERD (gastroesophageal reflux disease)   . Hyperlipidemia   . Type 2 diabetes mellitus ([place])     Past Surgical History:  Procedure Laterality Date  . BACK SURGERY      Current Outpatient Prescriptions  Medication Sig Dispense Refill  . aspirin EC 81 MG tablet Take 81 mg by mouth daily.    . carvedilol (COREG) 25 MG tablet Take 1 tablet (25 mg total) by mouth 2 (two) times daily. 180 tablet 3  . citalopram (CELEXA) 40 MG tablet Take 40 mg by mouth daily.    .  furosemide (LASIX) 40 MG tablet Take 1 tablet (40 mg total) by mouth daily. 30 tablet 0  . glipiZIDE (GLUCOTROL) 5 MG tablet Take 1 tablet (5 mg total) by mouth 2 (two) times daily. 60 tablet 0  . hydrALAZINE (APRESOLINE) 50 MG tablet Take 1 tablet (50 mg total) by mouth every 8 (eight) hours. 90 tablet 0  . isosorbide mononitrate (IMDUR) 30 MG 24 hr tablet Take 1 tablet (30 mg total) by mouth daily. 30 tablet 0  . LANTUS SOLOSTAR 100 UNIT/ML Solostar Pen Inject 12 Units into the skin daily.    Marland Kitchen LEVEMIR FLEXTOUCH 100 UNIT/ML Pen Inject 16 Units into the muscle daily.    Marland Kitchen levofloxacin (LEVAQUIN) 750 MG tablet Take 1 tablet (750 mg total) by mouth daily. 3 tablet 0  . mirtazapine (REMERON) 45 MG tablet Take 45 mg by mouth at bedtime.    Marland Kitchen omeprazole (PRILOSEC) 20 MG capsule Take 20 mg by mouth daily.    . potassium chloride SA (K-DUR,KLOR-CON) 20 MEQ tablet Take 1 tablet (20 mEq total) by mouth daily. 90 tablet 3  . pravastatin (PRAVACHOL) 40 MG tablet Take 40 mg by mouth daily.    . risperiDONE (RISPERDAL) 2 MG tablet Take 2 mg by mouth at bedtime.     No current facility-administered medications for this visit.    Allergies:  Benazepril and Penicillins   Social History: The patient  reports that she has been smoking Cigarettes.  She  has been smoking about 0.25 packs per day. She has never used smokeless tobacco. She reports that she does not drink alcohol or use drugs.   ROS:  Please see the history of present illness. Otherwise, complete review of systems is positive for none.  All other systems are reviewed and negative.   Physical Exam: VS:  BP (!) 150/80   Pulse 81   Ht 5\' 3"  (1.6 m)   Wt 148 lb (67.1 kg)   SpO2 97%   BMI 26.22 kg/m , BMI Body mass index is 26.22 kg/m.  Wt Readings from Last 3 Encounters:  09/07/16 148 lb (67.1 kg)  09/01/16 142 lb (64.4 kg)  07/20/16 147 lb (66.7 kg)    General: Patient appears comfortable at rest. HEENT: Conjunctiva and lids normal,  oropharynx clear. Neck: Supple, no elevated JVP or carotid bruits, no thyromegaly. Lungs: Clear to auscultation, nonlabored breathing at rest. Cardiac: Regular rate and rhythm, no S3 or significant systolic murmur, no pericardial rub. Abdomen: Soft, nontender, bowel sounds present, no guarding or rebound. Extremities: No pitting edema, distal pulses 2+. Skin: Warm and dry. Musculoskeletal: No kyphosis. Neuropsychiatric: Alert and oriented x3, affect grossly appropriate.  ECG: I personally reviewed the tracing from 09/01/2016 which showed sinus rhythm with increased voltage and repolarization abnormalities.  Recent Labwork: 09/01/2016: ALT 24; AST 39; B Natriuretic Peptide 57.0; BUN 8; Creatinine, Ser 0.77; Hemoglobin 16.5; Platelets 182; Potassium 4.1; Sodium 138   Other Studies Reviewed Today:  Chest CT 09/01/2016: IMPRESSION: 1. No evidence of pulmonary embolism. 2. Cardiomegaly with left atrial enlargement, mild left ventricular enlargement and left ventricular hypertrophy. 3. Mild aortic arch atherosclerosis. 4. COPD/emphysema.  No acute cardiopulmonary disease.  Lexiscan Myoview 07/27/2016:  No diagnostic ST segment changes to indicate ischemia. Exercise testing converted to Lexiscan due to significant hypertensive response.  Blood pressure demonstrated a hypertensive response to exercise.  Small, mild intensity, partially reversible anteroapical defect that most likely represents variable soft tissue attenuation, less likely small ischemic zone.  This is a low risk study.  Nuclear stress EF: 51%.  Echocardiogram 07/08/2016: Study Conclusions  - Left ventricle: The cavity size was normal. Wall thickness was   increased in a pattern of mild LVH. Systolic function was at the   lower limits of normal. The estimated ejection fraction was 50%.   Diffuse hypokinesis. Doppler parameters are consistent with   abnormal left ventricular relaxation (grade 1 diastolic    dysfunction). - Left atrium: The atrium was mildly dilated.  Assessment and Plan:  1. Hypertensive heart disease with diastolic dysfunction. Main goal at this time is blood pressure control. She reports compliance with her medications, now including Coreg, hydralazine, and Lasix. I reviewed her recent lab work and cardiac testing. We will arrange follow-up next year with a repeat echocardiogram.  2. Hyperlipidemia, on Pravachol. Keep follow-up with PCP.  3. Tobacco abuse. Smoking cessation recommended.  4. Type 2 diabetes mellitus, on glipizide and Levemir. Keep follow-up with PCP.  Current medicines were reviewed with the patient today.  Disposition: Follow-up in one year with echocardiogram.  Signed, Satira Sark, MD, [facility] 09/07/2016 1:53 PM    [place] [facility] [address], [place], [place] [address] Phone: [phone]; Fax: [phone]

## 2016-09-07 ENCOUNTER — Ambulatory Visit (INDEPENDENT_AMBULATORY_CARE_PROVIDER_SITE_OTHER): Payer: Medicare Other | Admitting: Cardiology

## 2016-09-07 ENCOUNTER — Encounter: Payer: Self-pay | Admitting: Cardiology

## 2016-09-07 VITALS — BP 150/80 | HR 81 | Ht 63.0 in | Wt 148.0 lb

## 2016-09-07 DIAGNOSIS — I5032 Chronic diastolic (congestive) heart failure: Secondary | ICD-10-CM | POA: Diagnosis not present

## 2016-09-07 DIAGNOSIS — E782 Mixed hyperlipidemia: Secondary | ICD-10-CM

## 2016-09-07 DIAGNOSIS — I110 Hypertensive heart disease with heart failure: Secondary | ICD-10-CM

## 2016-09-07 DIAGNOSIS — E119 Type 2 diabetes mellitus without complications: Secondary | ICD-10-CM

## 2016-09-07 DIAGNOSIS — I10 Essential (primary) hypertension: Secondary | ICD-10-CM | POA: Diagnosis not present

## 2016-09-07 NOTE — Addendum Note (Signed)
Addended byDebbora Lacrosse R on: 09/07/2016 02:00 PM   Modules accepted: Orders

## 2016-09-07 NOTE — Patient Instructions (Signed)
Medication Instructions:  Your physician recommends that you continue on your current medications as directed. Please refer to the Current Medication list given to you today.   Labwork: none  Testing/Procedures: Your physician has requested that you have an echocardiogram. Echocardiography is a painless test that uses sound waves to create images of your heart. It provides your doctor with information about the size and shape of your heart and how well your heart's chambers and valves are working. This procedure takes approximately one hour. There are no restrictions for this procedure.     Follow-Up: Your physician wants you to follow-up in: 1 year. You will receive a reminder letter in the mail two months in advance. If you don't receive a letter, please call our office to schedule the follow-up appointment.   Any Other Special Instructions Will Be Listed Below (If Applicable).     If you need a refill on your cardiac medications before your next appointment, please call your pharmacy.  

## 2016-10-02 DIAGNOSIS — I639 Cerebral infarction, unspecified: Secondary | ICD-10-CM

## 2016-10-02 HISTORY — DX: Cerebral infarction, unspecified: I63.9

## 2016-10-15 ENCOUNTER — Inpatient Hospital Stay (HOSPITAL_COMMUNITY): Payer: Medicare Other

## 2016-10-15 ENCOUNTER — Inpatient Hospital Stay (HOSPITAL_COMMUNITY)
Admission: EM | Admit: 2016-10-15 | Discharge: 2016-10-19 | DRG: 065 | Disposition: A | Discharge status: Home / Self Care | Payer: Medicare Other | Attending: Internal Medicine | Admitting: Internal Medicine

## 2016-10-15 ENCOUNTER — Encounter (HOSPITAL_COMMUNITY): Payer: Self-pay

## 2016-10-15 ENCOUNTER — Encounter (HOSPITAL_COMMUNITY): Payer: Self-pay | Admitting: Emergency Medicine

## 2016-10-15 ENCOUNTER — Emergency Department (HOSPITAL_COMMUNITY): Payer: Medicare Other

## 2016-10-15 DIAGNOSIS — E1165 Type 2 diabetes mellitus with hyperglycemia: Secondary | ICD-10-CM | POA: Diagnosis present

## 2016-10-15 DIAGNOSIS — Z8249 Family history of ischemic heart disease and other diseases of the circulatory system: Secondary | ICD-10-CM | POA: Diagnosis not present

## 2016-10-15 DIAGNOSIS — E1151 Type 2 diabetes mellitus with diabetic peripheral angiopathy without gangrene: Secondary | ICD-10-CM | POA: Diagnosis present

## 2016-10-15 DIAGNOSIS — E876 Hypokalemia: Secondary | ICD-10-CM | POA: Diagnosis present

## 2016-10-15 DIAGNOSIS — F17210 Nicotine dependence, cigarettes, uncomplicated: Secondary | ICD-10-CM | POA: Diagnosis present

## 2016-10-15 DIAGNOSIS — G8191 Hemiplegia, unspecified affecting right dominant side: Secondary | ICD-10-CM | POA: Diagnosis present

## 2016-10-15 DIAGNOSIS — Z720 Tobacco use: Secondary | ICD-10-CM | POA: Diagnosis present

## 2016-10-15 DIAGNOSIS — Z833 Family history of diabetes mellitus: Secondary | ICD-10-CM | POA: Diagnosis not present

## 2016-10-15 DIAGNOSIS — I5043 Acute on chronic combined systolic (congestive) and diastolic (congestive) heart failure: Secondary | ICD-10-CM | POA: Diagnosis not present

## 2016-10-15 DIAGNOSIS — Z79899 Other long term (current) drug therapy: Secondary | ICD-10-CM | POA: Diagnosis not present

## 2016-10-15 DIAGNOSIS — I5032 Chronic diastolic (congestive) heart failure: Secondary | ICD-10-CM | POA: Diagnosis present

## 2016-10-15 DIAGNOSIS — I110 Hypertensive heart disease with heart failure: Secondary | ICD-10-CM | POA: Diagnosis present

## 2016-10-15 DIAGNOSIS — J811 Chronic pulmonary edema: Secondary | ICD-10-CM | POA: Diagnosis present

## 2016-10-15 DIAGNOSIS — I10 Essential (primary) hypertension: Secondary | ICD-10-CM | POA: Diagnosis not present

## 2016-10-15 DIAGNOSIS — I639 Cerebral infarction, unspecified: Principal | ICD-10-CM

## 2016-10-15 DIAGNOSIS — R29810 Facial weakness: Secondary | ICD-10-CM | POA: Diagnosis present

## 2016-10-15 DIAGNOSIS — K219 Gastro-esophageal reflux disease without esophagitis: Secondary | ICD-10-CM | POA: Diagnosis present

## 2016-10-15 DIAGNOSIS — E119 Type 2 diabetes mellitus without complications: Secondary | ICD-10-CM

## 2016-10-15 DIAGNOSIS — I161 Hypertensive emergency: Secondary | ICD-10-CM | POA: Diagnosis present

## 2016-10-15 DIAGNOSIS — I6381 Other cerebral infarction due to occlusion or stenosis of small artery: Secondary | ICD-10-CM

## 2016-10-15 DIAGNOSIS — Z880 Allergy status to penicillin: Secondary | ICD-10-CM | POA: Diagnosis not present

## 2016-10-15 DIAGNOSIS — I6789 Other cerebrovascular disease: Secondary | ICD-10-CM | POA: Diagnosis not present

## 2016-10-15 DIAGNOSIS — Z794 Long term (current) use of insulin: Secondary | ICD-10-CM

## 2016-10-15 DIAGNOSIS — E784 Other hyperlipidemia: Secondary | ICD-10-CM | POA: Diagnosis not present

## 2016-10-15 DIAGNOSIS — Z888 Allergy status to other drugs, medicaments and biological substances status: Secondary | ICD-10-CM

## 2016-10-15 DIAGNOSIS — E118 Type 2 diabetes mellitus with unspecified complications: Secondary | ICD-10-CM | POA: Diagnosis not present

## 2016-10-15 DIAGNOSIS — Z7982 Long term (current) use of aspirin: Secondary | ICD-10-CM | POA: Diagnosis not present

## 2016-10-15 DIAGNOSIS — R4781 Slurred speech: Secondary | ICD-10-CM | POA: Diagnosis present

## 2016-10-15 DIAGNOSIS — E785 Hyperlipidemia, unspecified: Secondary | ICD-10-CM | POA: Diagnosis present

## 2016-10-15 DIAGNOSIS — I5042 Chronic combined systolic (congestive) and diastolic (congestive) heart failure: Secondary | ICD-10-CM | POA: Diagnosis present

## 2016-10-15 DIAGNOSIS — R29707 NIHSS score 7: Secondary | ICD-10-CM | POA: Diagnosis present

## 2016-10-15 DIAGNOSIS — R531 Weakness: Secondary | ICD-10-CM

## 2016-10-15 DIAGNOSIS — I671 Cerebral aneurysm, nonruptured: Secondary | ICD-10-CM | POA: Insufficient documentation

## 2016-10-15 LAB — COMPREHENSIVE METABOLIC PANEL
ALBUMIN: 3.9 g/dL (ref 3.5–5.0)
ALK PHOS: 91 U/L (ref 38–126)
ALT: 20 U/L (ref 14–54)
ANION GAP: 11 (ref 5–15)
AST: 34 U/L (ref 15–41)
BILIRUBIN TOTAL: 0.7 mg/dL (ref 0.3–1.2)
BUN: 7 mg/dL (ref 6–20)
CALCIUM: 9.3 mg/dL (ref 8.9–10.3)
CO2: 24 mmol/L (ref 22–32)
CREATININE: 0.62 mg/dL (ref 0.44–1.00)
Chloride: 99 mmol/L — ABNORMAL LOW (ref 101–111)
GFR calc Af Amer: >60 mL/min (ref >60)
GFR calc non Af Amer: >60 mL/min (ref >60)
Glucose, Bld: 325 mg/dL — ABNORMAL HIGH (ref 65–99)
Potassium: 3.9 mmol/L (ref 3.5–5.1)
Sodium: 134 mmol/L — ABNORMAL LOW (ref 135–145)
TOTAL PROTEIN: 8.4 g/dL — AB (ref 6.5–8.1)

## 2016-10-15 LAB — PROTIME-INR
INR: 0.96
Prothrombin Time: 12.8 seconds (ref 11.4–15.2)

## 2016-10-15 LAB — I-STAT CHEM 8, ED
BUN: 5 mg/dL — AB (ref 6–20)
CREATININE: 0.5 mg/dL (ref 0.44–1.00)
Calcium, Ion: 1.07 mmol/L — ABNORMAL LOW (ref 1.15–1.40)
Chloride: 98 mmol/L — ABNORMAL LOW (ref 101–111)
GLUCOSE: 324 mg/dL — AB (ref 65–99)
HCT: 51 % — ABNORMAL HIGH (ref 36.0–46.0)
Hemoglobin: 17.3 g/dL — ABNORMAL HIGH (ref 12.0–15.0)
Potassium: 4 mmol/L (ref 3.5–5.1)
Sodium: 138 mmol/L (ref 135–145)
TCO2: 24 mmol/L (ref 0–100)

## 2016-10-15 LAB — CBC
HCT: 46.6 % — ABNORMAL HIGH (ref 36.0–46.0)
HEMOGLOBIN: 15.4 g/dL — AB (ref 12.0–15.0)
MCH: 28.7 pg (ref 26.0–34.0)
MCHC: 33 g/dL (ref 30.0–36.0)
MCV: 86.9 fL (ref 78.0–100.0)
Platelets: 201 10*3/uL (ref 150–400)
RBC: 5.36 MIL/uL — AB (ref 3.87–5.11)
RDW: 13.8 % (ref 11.5–15.5)
WBC: 8.8 10*3/uL (ref 4.0–10.5)

## 2016-10-15 LAB — GLUCOSE, CAPILLARY
GLUCOSE-CAPILLARY: 136 mg/dL — AB (ref 65–99)
GLUCOSE-CAPILLARY: 140 mg/dL — AB (ref 65–99)

## 2016-10-15 LAB — URINALYSIS, ROUTINE W REFLEX MICROSCOPIC
BILIRUBIN URINE: NEGATIVE
Hgb urine dipstick: NEGATIVE
KETONES UR: NEGATIVE mg/dL
LEUKOCYTES UA: NEGATIVE
NITRITE: NEGATIVE
PH: 8 (ref 5.0–8.0)
Protein, ur: NEGATIVE mg/dL
Specific Gravity, Urine: 1.009 (ref 1.005–1.030)

## 2016-10-15 LAB — CBG MONITORING, ED: Glucose-Capillary: 340 mg/dL — ABNORMAL HIGH (ref 65–99)

## 2016-10-15 LAB — RAPID URINE DRUG SCREEN, HOSP PERFORMED
Amphetamines: NOT DETECTED
BARBITURATES: NOT DETECTED
Benzodiazepines: NOT DETECTED
Cocaine: NOT DETECTED
Opiates: NOT DETECTED
Tetrahydrocannabinol: POSITIVE — AB

## 2016-10-15 LAB — DIFFERENTIAL
Basophils Absolute: 0 10*3/uL (ref 0.0–0.1)
Basophils Relative: 0 %
EOS PCT: 2 %
Eosinophils Absolute: 0.2 10*3/uL (ref 0.0–0.7)
LYMPHS ABS: 3.4 10*3/uL (ref 0.7–4.0)
LYMPHS PCT: 39 %
Monocytes Absolute: 0.6 10*3/uL (ref 0.1–1.0)
Monocytes Relative: 7 %
NEUTROS PCT: 52 %
Neutro Abs: 4.5 10*3/uL (ref 1.7–7.7)

## 2016-10-15 LAB — ECHOCARDIOGRAM COMPLETE
HEIGHTINCHES: 63 in
WEIGHTICAEL: 2368 [oz_av]

## 2016-10-15 LAB — ETHANOL: Alcohol, Ethyl (B): <5 mg/dL (ref <5)

## 2016-10-15 LAB — APTT: aPTT: 27 seconds (ref 24–36)

## 2016-10-15 LAB — I-STAT TROPONIN, ED: Troponin i, poc: 0 ng/mL (ref 0.00–0.08)

## 2016-10-15 LAB — MRSA PCR SCREENING: MRSA BY PCR: NEGATIVE

## 2016-10-15 MED ORDER — ACETAMINOPHEN 650 MG RE SUPP: 650.0000 mg | RECTAL | Status: DC | PRN

## 2016-10-15 MED ORDER — NICARDIPINE HCL IN NACL 20-0.86 MG/200ML-% IV SOLN
3.0000 mg/h | Freq: Once | INTRAVENOUS | Status: DC
  Filled 2016-10-15: qty 200

## 2016-10-15 MED ORDER — ISOSORBIDE MONONITRATE ER 30 MG PO TB24
30.0000 mg | ORAL_TABLET | Freq: Every day | ORAL | Status: DC
  Administered 2016-10-16 – 2016-10-19 (×4): 30 mg via ORAL
  Filled 2016-10-15 (×4): qty 1

## 2016-10-15 MED ORDER — NITROGLYCERIN IN D5W 200-5 MCG/ML-% IV SOLN
0.0000 ug/min | INTRAVENOUS | Status: DC
  Administered 2016-10-15: 5 ug/min via INTRAVENOUS
  Administered 2016-10-16: 15 ug/min via INTRAVENOUS

## 2016-10-15 MED ORDER — ACETAMINOPHEN 325 MG PO TABS
650.0000 mg | ORAL_TABLET | ORAL | Status: DC | PRN
  Administered 2016-10-15 – 2016-10-18 (×3): 650 mg via ORAL
  Filled 2016-10-15 (×3): qty 2

## 2016-10-15 MED ORDER — GLIPIZIDE 5 MG PO TABS
5.0000 mg | ORAL_TABLET | Freq: Two times a day (BID) | ORAL | Status: DC
  Administered 2016-10-15 – 2016-10-16 (×2): 5 mg via ORAL
  Filled 2016-10-15 (×3): qty 1

## 2016-10-15 MED ORDER — SODIUM CHLORIDE 0.9 % IV SOLN
INTRAVENOUS | Status: DC
  Administered 2016-10-15: 14:00:00 via INTRAVENOUS
  Administered 2016-10-16: 1000 mL via INTRAVENOUS
  Administered 2016-10-17: 17:00:00 via INTRAVENOUS

## 2016-10-15 MED ORDER — POTASSIUM CHLORIDE CRYS ER 20 MEQ PO TBCR
20.0000 meq | EXTENDED_RELEASE_TABLET | Freq: Every day | ORAL | Status: DC
  Administered 2016-10-16 – 2016-10-19 (×3): 20 meq via ORAL
  Filled 2016-10-15 (×4): qty 1

## 2016-10-15 MED ORDER — CITALOPRAM HYDROBROMIDE 40 MG PO TABS
40.0000 mg | ORAL_TABLET | Freq: Every day | ORAL | Status: DC
  Administered 2016-10-16 – 2016-10-19 (×4): 40 mg via ORAL
  Filled 2016-10-15: qty 1
  Filled 2016-10-15: qty 2
  Filled 2016-10-15: qty 1
  Filled 2016-10-15 (×2): qty 2
  Filled 2016-10-15: qty 1

## 2016-10-15 MED ORDER — SENNOSIDES-DOCUSATE SODIUM 8.6-50 MG PO TABS
1.0000 | ORAL_TABLET | Freq: Every evening | ORAL | Status: DC | PRN
  Administered 2016-10-17 – 2016-10-18 (×2): 1 via ORAL
  Filled 2016-10-15 (×3): qty 1

## 2016-10-15 MED ORDER — LABETALOL HCL 5 MG/ML IV SOLN
10.0000 mg | Freq: Once | INTRAVENOUS | Status: AC
  Administered 2016-10-15: 10 mg via INTRAVENOUS
  Filled 2016-10-15: qty 4

## 2016-10-15 MED ORDER — ASPIRIN 300 MG RE SUPP
300.0000 mg | Freq: Every day | RECTAL | Status: DC
  Filled 2016-10-15: qty 1

## 2016-10-15 MED ORDER — CARVEDILOL 12.5 MG PO TABS
25.0000 mg | ORAL_TABLET | Freq: Two times a day (BID) | ORAL | Status: DC
  Administered 2016-10-15 – 2016-10-19 (×9): 25 mg via ORAL
  Filled 2016-10-15 (×2): qty 1
  Filled 2016-10-15 (×2): qty 2
  Filled 2016-10-15 (×2): qty 1
  Filled 2016-10-15: qty 2
  Filled 2016-10-15 (×2): qty 1

## 2016-10-15 MED ORDER — INSULIN ASPART 100 UNIT/ML ~~LOC~~ SOLN
0.0000 [IU] | Freq: Three times a day (TID) | SUBCUTANEOUS | Status: DC
  Administered 2016-10-15 – 2016-10-16 (×2): 2 [IU] via SUBCUTANEOUS
  Administered 2016-10-16: 5 [IU] via SUBCUTANEOUS
  Administered 2016-10-16 – 2016-10-17 (×2): 3 [IU] via SUBCUTANEOUS
  Administered 2016-10-17 – 2016-10-18 (×2): 5 [IU] via SUBCUTANEOUS
  Administered 2016-10-18: 2 [IU] via SUBCUTANEOUS
  Administered 2016-10-18: 3 [IU] via SUBCUTANEOUS
  Administered 2016-10-19: 8 [IU] via SUBCUTANEOUS
  Filled 2016-10-15 (×25): qty 0.15

## 2016-10-15 MED ORDER — ACETAMINOPHEN 160 MG/5ML PO SOLN: 650.0000 mg | ORAL | Status: DC | PRN

## 2016-10-15 MED ORDER — INSULIN ASPART 100 UNIT/ML ~~LOC~~ SOLN
4.0000 [IU] | Freq: Three times a day (TID) | SUBCUTANEOUS | Status: DC
  Administered 2016-10-16 – 2016-10-18 (×6): 4 [IU] via SUBCUTANEOUS
  Filled 2016-10-15 (×25): qty 0.04

## 2016-10-15 MED ORDER — HYDRALAZINE HCL 50 MG PO TABS
50.0000 mg | ORAL_TABLET | Freq: Three times a day (TID) | ORAL | Status: DC
  Administered 2016-10-15 – 2016-10-17 (×6): 50 mg via ORAL
  Filled 2016-10-15: qty 1
  Filled 2016-10-15: qty 2
  Filled 2016-10-15 (×4): qty 1

## 2016-10-15 MED ORDER — INSULIN DETEMIR 100 UNIT/ML ~~LOC~~ SOLN
16.0000 [IU] | Freq: Every day | SUBCUTANEOUS | Status: DC
  Administered 2016-10-16 – 2016-10-18 (×3): 16 [IU] via SUBCUTANEOUS
  Filled 2016-10-15 (×5): qty 0.16

## 2016-10-15 MED ORDER — ENOXAPARIN SODIUM 40 MG/0.4ML ~~LOC~~ SOLN
40.0000 mg | SUBCUTANEOUS | Status: DC
  Administered 2016-10-15 – 2016-10-18 (×4): 40 mg via SUBCUTANEOUS
  Filled 2016-10-15 (×4): qty 0.4

## 2016-10-15 MED ORDER — MIRTAZAPINE 15 MG PO TABS
45.0000 mg | ORAL_TABLET | Freq: Every day | ORAL | Status: DC
  Administered 2016-10-15 – 2016-10-18 (×4): 45 mg via ORAL
  Filled 2016-10-15 (×3): qty 1
  Filled 2016-10-15: qty 3
  Filled 2016-10-15: qty 1

## 2016-10-15 MED ORDER — ASPIRIN 325 MG PO TABS
325.0000 mg | ORAL_TABLET | Freq: Every day | ORAL | Status: DC
  Administered 2016-10-15 – 2016-10-19 (×5): 325 mg via ORAL
  Filled 2016-10-15 (×5): qty 1

## 2016-10-15 MED ORDER — PANTOPRAZOLE SODIUM 40 MG PO TBEC
40.0000 mg | DELAYED_RELEASE_TABLET | Freq: Every day | ORAL | Status: DC
  Administered 2016-10-16 – 2016-10-19 (×4): 40 mg via ORAL
  Filled 2016-10-15 (×4): qty 1

## 2016-10-15 MED ORDER — INSULIN ASPART 100 UNIT/ML ~~LOC~~ SOLN
0.0000 [IU] | Freq: Every day | SUBCUTANEOUS | Status: DC
  Administered 2016-10-18: 2 [IU] via SUBCUTANEOUS
  Filled 2016-10-15 (×9): qty 0.05

## 2016-10-15 MED ORDER — STROKE: EARLY STAGES OF RECOVERY BOOK
Freq: Once | Status: AC
  Administered 2016-10-18: 06:00:00
  Filled 2016-10-15 (×2): qty 1

## 2016-10-15 MED ORDER — RISPERIDONE 0.5 MG PO TABS
2.0000 mg | ORAL_TABLET | Freq: Every day | ORAL | Status: DC
  Administered 2016-10-15 – 2016-10-18 (×4): 2 mg via ORAL
  Filled 2016-10-15: qty 1
  Filled 2016-10-15: qty 4
  Filled 2016-10-15 (×3): qty 1

## 2016-10-15 MED ORDER — NITROGLYCERIN IN D5W 200-5 MCG/ML-% IV SOLN
INTRAVENOUS | Status: AC
  Filled 2016-10-15: qty 250

## 2016-10-15 MED ORDER — PRAVASTATIN SODIUM 40 MG PO TABS
40.0000 mg | ORAL_TABLET | Freq: Every day | ORAL | Status: DC
  Administered 2016-10-16: 40 mg via ORAL
  Filled 2016-10-15: qty 1

## 2016-10-15 MED ORDER — FUROSEMIDE 40 MG PO TABS
40.0000 mg | ORAL_TABLET | Freq: Every day | ORAL | Status: DC
  Administered 2016-10-16 – 2016-10-19 (×4): 40 mg via ORAL
  Filled 2016-10-15 (×4): qty 1

## 2016-10-15 NOTE — ED Notes (Signed)
Dr Sabra Heck on phone consulting - VO to hold Cardene

## 2016-10-15 NOTE — ED Notes (Signed)
cbg of 340.

## 2016-10-15 NOTE — ED Notes (Signed)
Echo at bedside

## 2016-10-15 NOTE — H&P (Signed)
History and Physical    Andrian Urbach GMW:[id number] DOB: 1955-07-16 DOA: 10/15/2016  Referring MD/NP/PA: Noemi Chapel, EDP PCP: [facility]  Patient coming from: Home  Chief Complaint: Right-sided weakness, facial droop, headache  HPI: Rose Greer is a 61 y.o. female with history of hypertension, hyperlipidemia, type 2 diabetes who comes in today with the above-mentioned complaints. She states she went to bed yesterday at around 11 PM and was feeling "normal" other than a pounding headache. When she woke up this morning she felt her right foot was numb and had difficulty standing. When she went to brush her teeth she was unable to grip the toothbrush in her right hand. Her significant other noticed slurred speech and a right facial droop at which time she was brought into the hospital for evaluation. Code stroke was not called given uncertain time of symptom initiation. She takes 81 mg of aspirin on a daily basis and has done so for many years. Upon arrival to the hospital she was noted to have a blood pressure of 240/120, labs that are essentially unremarkable, a tox screen that is only positive for marijuana, negative urinalysis, CT scan of the head is normal. Given persistence of her neurologic findings and marked hypertension admission is requested. Due to lack of MRI over the weekend and lack of neurology coverage over the next week at [facility], transfer to [place] is requested.  Past Medical/Surgical History: Past Medical History:  Diagnosis Date  . Brain aneurysm   . Essential hypertension    History of hypertensive urgency with pulmonary edema April 2018  . GERD (gastroesophageal reflux disease)   . Hyperlipidemia   . Type 2 diabetes mellitus ([place])     Past Surgical History:  Procedure Laterality Date  . BACK SURGERY      Social History:  reports that she has been smoking Cigarettes.  She has been smoking about 0.25 packs per day. She has never  used smokeless tobacco. She reports that she does not drink alcohol or use drugs.  Allergies: Allergies  Allergen Reactions  . Benazepril Other (See Comments)    confusion  . Penicillins Rash    Has patient had a PCN reaction causing immediate rash, facial/tongue/throat swelling, SOB or lightheadedness with hypotension: Yes Has patient had a PCN reaction causing severe rash involving mucus membranes or skin necrosis: No Has patient had a PCN reaction that required hospitalization No Has patient had a PCN reaction occurring within the last 10 years: No If all of the above answers are "NO", then may proceed with Cephalosporin use.     Family History:  Family History  Problem Relation Age of Onset  . Diabetes Mother   . Hypertension Mother   . Cancer Father        lung   . Diabetes Sister        4 sisters ( hypertension )  . Hypertension Brother        4 brothers ( hypertension)     Prior to Admission medications   Medication Sig Start Date End Date Taking? Authorizing Provider  aspirin EC 81 MG tablet Take 81 mg by mouth daily.   Yes [provider]  carvedilol (COREG) 25 MG tablet Take 1 tablet (25 mg total) by mouth 2 (two) times daily. 08/01/16 10/30/16 Yes Imogene Burn, PA-C  citalopram (CELEXA) 40 MG tablet Take 40 mg by mouth daily. 06/02/16  Yes [provider]  furosemide (LASIX) 40 MG tablet Take  1 tablet (40 mg total) by mouth daily. 07/09/16  Yes Thurnell Lose, MD  glipiZIDE (GLUCOTROL) 5 MG tablet Take 1 tablet (5 mg total) by mouth 2 (two) times daily. 07/09/16  Yes Thurnell Lose, MD  hydrALAZINE (APRESOLINE) 50 MG tablet Take 1 tablet (50 mg total) by mouth every 8 (eight) hours. 07/09/16  Yes Thurnell Lose, MD  isosorbide mononitrate (IMDUR) 30 MG 24 hr tablet Take 1 tablet (30 mg total) by mouth daily. 07/09/16  Yes Thurnell Lose, MD  LEVEMIR FLEXTOUCH 100 UNIT/ML Pen Inject 16 Units into the muscle daily. 06/21/16  Yes [provider]  mirtazapine (REMERON) 45 MG tablet Take 45 mg by mouth at bedtime. 06/02/16  Yes [provider]  omeprazole (PRILOSEC) 20 MG capsule Take 20 mg by mouth daily. 06/02/16  Yes [provider]  potassium chloride SA (K-DUR,KLOR-CON) 20 MEQ tablet Take 1 tablet (20 mEq total) by mouth daily. 07/20/16  Yes Imogene Burn, PA-C  pravastatin (PRAVACHOL) 40 MG tablet Take 40 mg by mouth daily. 06/02/16  Yes [provider]  risperiDONE (RISPERDAL) 2 MG tablet Take 2 mg by mouth at bedtime. 06/02/16  Yes [provider]    Review of Systems:  Constitutional: Denies fever, chills, diaphoresis, appetite change and fatigue.  HEENT: Denies photophobia, eye pain, redness, hearing loss, ear pain, congestion, sore throat, rhinorrhea, sneezing, mouth sores, trouble swallowing, neck pain, neck stiffness and tinnitus.   Respiratory: Denies SOB, DOE, cough, chest tightness,  and wheezing.   Cardiovascular: Denies chest pain, palpitations and leg swelling.  Gastrointestinal: Denies nausea, vomiting, abdominal pain, diarrhea, constipation, blood in stool and abdominal distention.  Genitourinary: Denies dysuria, urgency, frequency, hematuria, flank pain and difficulty urinating.  Endocrine: Denies: hot or cold intolerance, sweats, changes in hair or nails, polyuria, polydipsia. Musculoskeletal: Denies myalgias, back pain, joint swelling, arthralgias and gait problem.  Skin: Denies pallor, rash and wound.  Neurological: Denies dizziness, seizures, syncope,  light-headedness. Hematological: Denies adenopathy. Easy bruising, personal or family bleeding history  Psychiatric/Behavioral: Denies suicidal ideation, mood changes, confusion, nervousness, sleep disturbance and agitation    Physical Exam: Vitals:   10/15/16 1200 10/15/16 1230 10/15/16 1300 10/15/16 1330  BP: (!) 206/103 (!) 210/115 (!) 194/97 (!) 216/120  Pulse: 62 83 65 69  Resp: 14 20 19 18   TempSrc:        SpO2: 98% 100% 100% 100%  Weight:      Height:         Constitutional: NAD, calm, comfortable, Slurred speech Eyes: PERRL, lids and conjunctivae normal ENMT: Mucous membranes are moist. Posterior pharynx clear of any exudate or lesions.Normal dentition.  Neck: normal, supple, no masses, no thyromegaly Respiratory: clear to auscultation bilaterally, no wheezing, no crackles. Normal respiratory effort. No accessory muscle use.  Cardiovascular: Regular rate and rhythm, no murmurs / rubs / gallops. No extremity edema. 2+ pedal pulses. No carotid bruits.  Abdomen: no tenderness, no masses palpated. No hepatosplenomegaly. Bowel sounds positive.  Musculoskeletal: no clubbing / cyanosis. No joint deformity upper and lower extremities. Good ROM, no contractures. Normal muscle tone.  Skin: no rashes, lesions, ulcers. No induration Neurologic: Marked right-sided facial droop, decreased proprioception and sensation on the right upper and lower extremity as well as the right side of her face, decreased grip on the right, positive pronator drift on the right, muscle strength about 2 out of 5 over right lower extremity, left lower extremity strength is normal. Psychiatric: Normal judgment and insight.  Alert and oriented x 3. Normal mood.    Labs on Admission: I have personally reviewed the following labs and imaging studies  CBC:  Recent Labs Lab 10/15/16 0944 10/15/16 0951  WBC 8.8  --   NEUTROABS 4.5  --   HGB 15.4* 17.3*  HCT 46.6* 51.0*  MCV 86.9  --   PLT 201  --    Basic Metabolic Panel:  Recent Labs Lab 10/15/16 0944 10/15/16 0951  NA 134* 138  K 3.9 4.0  CL 99* 98*  CO2 24  --   GLUCOSE 325* 324*  BUN 7 5*  CREATININE 0.62 0.50  CALCIUM 9.3  --    GFR: Estimated Creatinine Clearance: 67.1 mL/min (by C-G formula based on SCr of 0.5 mg/dL). Liver Function Tests:  Recent Labs Lab 10/15/16 0944  AST 34  ALT 20  ALKPHOS 91  BILITOT 0.7  PROT 8.4*  ALBUMIN 3.9   No  results for input(s): LIPASE, AMYLASE in the last 168 hours. No results for input(s): AMMONIA in the last 168 hours. Coagulation Profile:  Recent Labs Lab 10/15/16 0944  INR 0.96   Cardiac Enzymes: No results for input(s): CKTOTAL, CKMB, CKMBINDEX, TROPONINI in the last 168 hours. BNP (last 3 results) No results for input(s): PROBNP in the last 8760 hours. HbA1C: No results for input(s): HGBA1C in the last 72 hours. CBG:  Recent Labs Lab 10/15/16 0936  GLUCAP 340*   Lipid Profile: No results for input(s): CHOL, HDL, LDLCALC, TRIG, CHOLHDL, LDLDIRECT in the last 72 hours. Thyroid Function Tests: No results for input(s): TSH, T4TOTAL, FREET4, T3FREE, THYROIDAB in the last 72 hours. Anemia Panel: No results for input(s): VITAMINB12, FOLATE, FERRITIN, TIBC, IRON, RETICCTPCT in the last 72 hours. Urine analysis:    Component Value Date/Time   COLORURINE STRAW (A) 10/15/2016 1105   APPEARANCEUR CLEAR 10/15/2016 1105   LABSPEC 1.009 10/15/2016 1105   PHURINE 8.0 10/15/2016 1105   GLUCOSEU >=500 (A) 10/15/2016 1105   HGBUR NEGATIVE 10/15/2016 1105   BILIRUBINUR NEGATIVE 10/15/2016 1105   KETONESUR NEGATIVE 10/15/2016 1105   PROTEINUR NEGATIVE 10/15/2016 1105   NITRITE NEGATIVE 10/15/2016 1105   LEUKOCYTESUR NEGATIVE 10/15/2016 1105   Sepsis Labs: @LABRCNTIP (procalcitonin:4,lacticidven:4) )No results found for this or any previous visit (from the past 240 hour(s)).   Radiological Exams on Admission: Ct Head Wo Contrast  Result Date: 10/15/2016 CLINICAL DATA:  Pt reports waking at 0730 this morning with right sided weakness and numbness. States she was last seen normal at 2300 last night. EXAM: CT HEAD WITHOUT CONTRAST TECHNIQUE: Contiguous axial images were obtained from the base of the skull through the vertex without intravenous contrast. COMPARISON:  None. FINDINGS: Brain: No evidence of acute infarction, hemorrhage, hydrocephalus, extra-axial collection or mass  lesion/mass effect. Vascular: No hyperdense vessel or unexpected calcification. Skull: Normal. Negative for fracture or focal lesion. Sinuses/Orbits: Globes and orbits are unremarkable. Visualized sinuses and mastoid air cells are clear. Other: None. IMPRESSION: Normal unenhanced CT scan of the brain. Electronically Signed   By: Lajean Manes M.D.   On: 10/15/2016 10:17    EKG: Independently reviewed. Sinus rhythm, LVH, difficult to interpret T waves and ST segments due to wavy baseline.  Assessment/Plan Principal Problem:   Right sided weakness Active Problems:   Hypertensive emergency   DM (diabetes mellitus) (HCC)   Chronic diastolic CHF (congestive heart failure) (HCC)   HTN (hypertension)   Hyperlipidemia   Tobacco abuse    Right-sided weakness -Given persistence of symptoms, I  suspect she has had a left brain CVA. -CT scan of the head is negative. -Will admit for stroke workup including MRA/MRI of the brain, carotid Dopplers, 2-D echo, check lipids. -PT/OT/ST evaluations -Given lack of MRI and neurology coverage at [facility], will proceed to transfer to [place] aspirin suppository for now, if passes swallow evaluation will likely need to start on Plavix. -Consider neurology evaluation pending results of MRI.  Hypertensive emergency -Blood pressure at arrival was 240/120. -She was given 1 dose of IV labetalol with good results, however not lasting. -Given her heart rate is in the 50s I will elect to not continue labetalol and instead start her on a nitroglycerin drip with goal systolic blood pressure between 150 and 160. -Will also continue her home antihypertensives including Coreg, Lasix, hydralazine, Imdur. -Given her need for IV drip, stepdown bed has been requested.  Hyperlipidemia -Check lipids -Continue pravastatin for now.  Diabetes -Check A1c. -Continue home dose of Levemir and start on a moderate sliding scale.  Tobacco abuse -Counseled on  cessation. -Refuses nicotine patch for now.    DVT prophylaxis: Lovenox  Code Status: Full code  Family Communication: Significant other at bedside updated on plan of care and all questions answered  Disposition Plan: Transfer to [facility] for continued inpatient care  Consults called: None  Admission status: Inpatient    Time Spent: 85 minutes   Lelon Frohlich MD [facility] Pager [phone]  If 7PM-7AM, please contact night-coverage [email or web address] Password [email or web address]  10/15/2016, 2:20 PM

## 2016-10-15 NOTE — ED Notes (Addendum)
Echo continues

## 2016-10-15 NOTE — ED Notes (Addendum)
PT CONTACT :  Verline Lema [phone]  Celine Ahr (sister) [phone]                                 Cell  [phone]

## 2016-10-15 NOTE — ED Provider Notes (Signed)
[facility] DEPT Provider Note   CSN: [id number] Arrival date & time: 10/15/16  1245   An emergency department physician performed an initial assessment on this suspected stroke patient at [phone].  History   Chief Complaint Chief Complaint  Patient presents with  . Weakness    HPI Rose Greer is a 61 y.o. female.  HPI  The patient is a 61 year old female, she does have a history of a brain aneurysm as well as essential hypertension, diabetes and hyperlipidemia. She had a history of congestive heart failure and has recently had a workup with a stress test which was nonischemic. She has been known to not have good control over her blood pressure and in fact presents today with a complaint of headache with right-sided weakness. She reports that the headache was present last night when she went to bed but she did not have any weakness or numbness. Upon awakening this morning she noted that her right leg and her right arm are both weak and numb, she had trouble walking, no difficulty with speech or vision. This has been persistent, again she was last seen normal at 11:00 PM last night when she went to sleep. Her blood pressure is noted to be 240/120. Her symptoms are severe, persistent, nothing makes this better or worse.  Past Medical History:  Diagnosis Date  . Brain aneurysm   . Essential hypertension    History of hypertensive urgency with pulmonary edema April 2018  . GERD (gastroesophageal reflux disease)   . Hyperlipidemia   . Type 2 diabetes mellitus [facility])     Patient Active Problem List   Diagnosis Date Noted  . Chronic diastolic CHF (congestive heart failure) ([place]) 07/20/2016  . HTN (hypertension) 07/20/2016  . Chest pain 07/20/2016  . Family history of early CAD 07/20/2016  . Hyperlipidemia 07/20/2016  . Tobacco abuse 07/20/2016  . Acute systolic congestive heart failure ([place])   . Hypertensive urgency 07/07/2016  . Pulmonary edema 07/07/2016  . DM (diabetes  mellitus) ([place]) 07/07/2016    Past Surgical History:  Procedure Laterality Date  . BACK SURGERY      OB History    No data available       Home Medications    Prior to Admission medications   Medication Sig Start Date End Date Taking? Authorizing Provider  aspirin EC 81 MG tablet Take 81 mg by mouth daily.   Yes [provider]  carvedilol (COREG) 25 MG tablet Take 1 tablet (25 mg total) by mouth 2 (two) times daily. 08/01/16 10/30/16 Yes Imogene Burn, PA-C  citalopram (CELEXA) 40 MG tablet Take 40 mg by mouth daily. 06/02/16  Yes [provider]  furosemide (LASIX) 40 MG tablet Take 1 tablet (40 mg total) by mouth daily. 07/09/16  Yes Thurnell Lose, MD  glipiZIDE (GLUCOTROL) 5 MG tablet Take 1 tablet (5 mg total) by mouth 2 (two) times daily. 07/09/16  Yes Thurnell Lose, MD  hydrALAZINE (APRESOLINE) 50 MG tablet Take 1 tablet (50 mg total) by mouth every 8 (eight) hours. 07/09/16  Yes Thurnell Lose, MD  isosorbide mononitrate (IMDUR) 30 MG 24 hr tablet Take 1 tablet (30 mg total) by mouth daily. 07/09/16  Yes Thurnell Lose, MD  LEVEMIR FLEXTOUCH 100 UNIT/ML Pen Inject 16 Units into the muscle daily. 06/21/16  Yes [provider]  mirtazapine (REMERON) 45 MG tablet Take 45 mg by mouth at bedtime. 06/02/16  Yes [provider]  omeprazole (PRILOSEC) 20 MG capsule  Take 20 mg by mouth daily. 06/02/16  Yes [provider]  potassium chloride SA (K-DUR,KLOR-CON) 20 MEQ tablet Take 1 tablet (20 mEq total) by mouth daily. 07/20/16  Yes Imogene Burn, PA-C  pravastatin (PRAVACHOL) 40 MG tablet Take 40 mg by mouth daily. 06/02/16  Yes [provider]  risperiDONE (RISPERDAL) 2 MG tablet Take 2 mg by mouth at bedtime. 06/02/16  Yes [provider]    Family History Family History  Problem Relation Age of Onset  . Diabetes Mother   . Hypertension Mother   . Cancer Father        lung   . Diabetes Sister        4 sisters (  hypertension )  . Hypertension Brother        4 brothers ( hypertension)     Social History Social History  Substance Use Topics  . Smoking status: Current Some Day Smoker    Packs/day: 0.25    Types: Cigarettes  . Smokeless tobacco: Never Used  . Alcohol use No     Allergies   Benazepril and Penicillins   Review of Systems Review of Systems  All other systems reviewed and are negative.    Physical Exam Updated Vital Signs BP (!) 205/101 (BP Location: Right Arm)   Pulse 63   Resp 15   Ht 5\' 3"  (1.6 m)   Wt 67.1 kg (148 lb)   SpO2 98%   BMI 26.22 kg/m   Physical Exam  Constitutional: She appears well-developed and well-nourished. No distress.  HENT:  Head: Normocephalic and atraumatic.  Mouth/Throat: Oropharynx is clear and moist. No oropharyngeal exudate.  Eyes: Pupils are equal, round, and reactive to light. Conjunctivae and EOM are normal. Right eye exhibits no discharge. Left eye exhibits no discharge. No scleral icterus.  Neck: Normal range of motion. Neck supple. No JVD present. No thyromegaly present.  No carotid bruit  Cardiovascular: Normal rate, regular rhythm, normal heart sounds and intact distal pulses.  Exam reveals no gallop and no friction rub.   No murmur heard. Pulmonary/Chest: Effort normal and breath sounds normal. No respiratory distress. She has no wheezes. She has no rales.  Abdominal: Soft. Bowel sounds are normal. She exhibits no distension and no mass. There is no tenderness.  Musculoskeletal: Normal range of motion. She exhibits no edema or tenderness.  Lymphadenopathy:    She has no cervical adenopathy.  Neurological: She is alert.  The patient has clear speech, no signs of facial droop, symmetrical face, cranial nerves III through XII are normal, she has right upper extremity weakness with drift which is slight, she has left lower extremity is normal, right lower extremity is 4 out of 5 in strength, she has no dysmetria of the arms  however she cannot perform heel shin with the right foot on the left knee without significant discoordination.   Skin: Skin is warm and dry. No rash noted. No erythema.  Psychiatric: She has a normal mood and affect. Her behavior is normal.  Nursing note and vitals reviewed.    ED Treatments / Results  Labs (all labs ordered are listed, but only abnormal results are displayed) Labs Reviewed  CBC - Abnormal; Notable for the following:       Result Value   RBC 5.36 (*)    Hemoglobin 15.4 (*)    HCT 46.6 (*)    All other components within normal limits  COMPREHENSIVE METABOLIC PANEL - Abnormal; Notable for the following:  Sodium 134 (*)    Chloride 99 (*)    Glucose, Bld 325 (*)    Total Protein 8.4 (*)    All other components within normal limits  CBG MONITORING, ED - Abnormal; Notable for the following:    Glucose-Capillary 340 (*)    All other components within normal limits  I-STAT CHEM 8, ED - Abnormal; Notable for the following:    Chloride 98 (*)    BUN 5 (*)    Glucose, Bld 324 (*)    Calcium, Ion 1.07 (*)    Hemoglobin 17.3 (*)    HCT 51.0 (*)    All other components within normal limits  ETHANOL  PROTIME-INR  APTT  DIFFERENTIAL  RAPID URINE DRUG SCREEN, HOSP PERFORMED  URINALYSIS, ROUTINE W REFLEX MICROSCOPIC  I-STAT TROPOININ, ED  I-STAT CHEM 8, ED    EKG  EKG Interpretation  Date/Time:  Saturday October 15 2016 09:34:21 EDT Ventricular Rate:  80 PR Interval:    QRS Duration: 77 QT Interval:  400 QTC Calculation: 462 R Axis:   67 Text Interpretation:  Sinus rhythm Probable left atrial enlargement Probable left ventricular hypertrophy Borderline T abnormalities, inferior leads Baseline wander in lead(s) II III aVL aVF since last tracing no significant change Confirmed by Noemi Chapel [phone]) on 10/15/2016 9:44:18 AM       Radiology Ct Head Wo Contrast  Result Date: 10/15/2016 CLINICAL DATA:  Pt reports waking at 0730 this morning with right sided  weakness and numbness. States she was last seen normal at 2300 last night. EXAM: CT HEAD WITHOUT CONTRAST TECHNIQUE: Contiguous axial images were obtained from the base of the skull through the vertex without intravenous contrast. COMPARISON:  None. FINDINGS: Brain: No evidence of acute infarction, hemorrhage, hydrocephalus, extra-axial collection or mass lesion/mass effect. Vascular: No hyperdense vessel or unexpected calcification. Skull: Normal. Negative for fracture or focal lesion. Sinuses/Orbits: Globes and orbits are unremarkable. Visualized sinuses and mastoid air cells are clear. Other: None. IMPRESSION: Normal unenhanced CT scan of the brain. Electronically Signed   By: Lajean Manes M.D.   On: 10/15/2016 10:17    Procedures Procedures (including critical care time)  Medications Ordered in ED Medications  nicardipine (CARDENE) 20mg  in 0.86% saline 238ml IV infusion (0.1 mg/ml) (not administered)  labetalol (NORMODYNE,TRANDATE) injection 10 mg (not administered)     Initial Impression / Assessment and Plan / ED Course  I have reviewed the triage vital signs and the nursing notes.  Pertinent labs & imaging results that were available during my care of the patient were reviewed by me and considered in my medical decision making (see chart for details).   the blood pressure is severely elevated, the patient appears to be weak consistent with a unilateral ischemic injury to the brain, given her headache and hypertension this could also be hemorrhagic. CT scan has been ordered to further evaluate the source of the symptoms however the patient will likely need to be admitted for a stroke workup. The patient is critically ill with a new onset stroke.  CT neg for bleed BP severely elevated Labetalol IV given Pt likely has Hypertensive crisis with acute CVA  D/w Dr. Jerilee Hoh who will facilitate transfer to [place] higher level of care  Outside of stroke  window.  CRITICAL CARE Performed by: Johnna Acosta Total critical care time: 35 minutes Critical care time was exclusive of separately billable procedures and treating other patients. Critical care was necessary to treat or  prevent imminent or life-threatening deterioration. Critical care was time spent personally by me on the following activities: development of treatment plan with patient and/or surrogate as well as nursing, discussions with consultants, evaluation of patient's response to treatment, examination of patient, obtaining history from patient or surrogate, ordering and performing treatments and interventions, ordering and review of laboratory studies, ordering and review of radiographic studies, pulse oximetry and re-evaluation of patient's condition.   Final Clinical Impressions(s) / ED Diagnoses   Final diagnoses:  Acute ischemic stroke [facility])  Hypertensive emergency    New Prescriptions New Prescriptions   No medications on file     Noemi Chapel, MD 10/15/16 1105

## 2016-10-15 NOTE — ED Notes (Signed)
Pt transported to CT ?

## 2016-10-15 NOTE — Progress Notes (Signed)
  Echocardiogram 2D Echocardiogram has been performed.  Rose Greer 10/15/2016, 2:52 PM

## 2016-10-15 NOTE — ED Triage Notes (Signed)
Pt reports waking at 0730 this morning with right sided weakness and numbness.  States she was last seen normal at 2300 last night.

## 2016-10-15 NOTE — ED Notes (Signed)
Ppt facial droop appears to have completely resolved

## 2016-10-16 ENCOUNTER — Inpatient Hospital Stay (HOSPITAL_COMMUNITY): Payer: Medicare Other

## 2016-10-16 DIAGNOSIS — E118 Type 2 diabetes mellitus with unspecified complications: Secondary | ICD-10-CM

## 2016-10-16 DIAGNOSIS — E784 Other hyperlipidemia: Secondary | ICD-10-CM

## 2016-10-16 DIAGNOSIS — E1165 Type 2 diabetes mellitus with hyperglycemia: Secondary | ICD-10-CM

## 2016-10-16 DIAGNOSIS — I5043 Acute on chronic combined systolic (congestive) and diastolic (congestive) heart failure: Secondary | ICD-10-CM

## 2016-10-16 LAB — BASIC METABOLIC PANEL
Anion gap: 10 (ref 5–15)
BUN: 6 mg/dL (ref 6–20)
CHLORIDE: 105 mmol/L (ref 101–111)
CO2: 20 mmol/L — AB (ref 22–32)
CREATININE: 0.62 mg/dL (ref 0.44–1.00)
Calcium: 8.8 mg/dL — ABNORMAL LOW (ref 8.9–10.3)
GFR calc Af Amer: >60 mL/min (ref >60)
GFR calc non Af Amer: >60 mL/min (ref >60)
GLUCOSE: 177 mg/dL — AB (ref 65–99)
Potassium: 4.2 mmol/L (ref 3.5–5.1)
SODIUM: 135 mmol/L (ref 135–145)

## 2016-10-16 LAB — GLUCOSE, CAPILLARY
GLUCOSE-CAPILLARY: 186 mg/dL — AB (ref 65–99)
Glucose-Capillary: 218 mg/dL — ABNORMAL HIGH (ref 65–99)
Glucose-Capillary: 138 mg/dL — ABNORMAL HIGH (ref 65–99)
Glucose-Capillary: 187 mg/dL — ABNORMAL HIGH (ref 65–99)

## 2016-10-16 LAB — LIPID PANEL
CHOL/HDL RATIO: 8.8 ratio
Cholesterol: 194 mg/dL (ref 0–200)
HDL: 22 mg/dL — AB (ref >40)
LDL Cholesterol: 126 mg/dL — ABNORMAL HIGH (ref 0–99)
Triglycerides: 228 mg/dL — ABNORMAL HIGH (ref <150)
VLDL: 46 mg/dL — ABNORMAL HIGH (ref 0–40)

## 2016-10-16 LAB — MAGNESIUM: Magnesium: 1.8 mg/dL (ref 1.7–2.4)

## 2016-10-16 MED ORDER — ATORVASTATIN CALCIUM 40 MG PO TABS
40.0000 mg | ORAL_TABLET | Freq: Every day | ORAL | Status: DC
  Administered 2016-10-16 – 2016-10-17 (×2): 40 mg via ORAL
  Filled 2016-10-16 (×2): qty 1

## 2016-10-16 NOTE — Evaluation (Signed)
Speech Language Pathology Evaluation Patient Details Name: Rose Greer MRN: [id number] DOB: 07/05/1955 Today's Date: 10/16/2016 Time: 4627-0350 SLP Time Calculation (min) (ACUTE ONLY): 20 min  Problem List:  Patient Active Problem List   Diagnosis Date Noted  . Hypertensive emergency 10/15/2016  . Right sided weakness 10/15/2016  . Brain aneurysm   . Chronic diastolic CHF (congestive heart failure) ([place]) 07/20/2016  . HTN (hypertension) 07/20/2016  . Chest pain 07/20/2016  . Family history of early CAD 07/20/2016  . Hyperlipidemia 07/20/2016  . Tobacco abuse 07/20/2016  . Acute systolic congestive heart failure ([place])   . Hypertensive urgency 07/07/2016  . Pulmonary edema 07/07/2016  . DM (diabetes mellitus) ([place]) 07/07/2016   Past Medical History:  Past Medical History:  Diagnosis Date  . Brain aneurysm   . Essential hypertension    History of hypertensive urgency with pulmonary edema April 2018  . GERD (gastroesophageal reflux disease)   . Hyperlipidemia   . Type 2 diabetes mellitus ([place])    Past Surgical History:  Past Surgical History:  Procedure Laterality Date  . BACK SURGERY     HPI:  Pt 61 yo female admitted through ED with right sided weakness and facial droop. Pt reports she went to bed with a headache and woke up with weakness. Pt was found to have a L thalamus and L putaminal acute stroke via MRI. PMH significant for HTN, HLD, DM2, brain aneurysm, GERD.    Assessment / Plan / Recommendation Clinical Impression  Patient presents with minimal dysarthria with no significant impact on speech intelligibility. She reports she is at baseline for all cognitive-linguistic skills, which appear functional for safe return to home. Receptive and expressive language functional for moderately complex conversation. Immediate recall 5/5, delayed recall 2/5 but pt recalls 5/5 with category cue. Mental manipulation 5/5. She demonstrated selective attention during the evaluation,  attending to and completing all tasks despite interruptions including a phone call. Provided education re: f/u with SLP should she note functional deficits in the above cognitive linguistic areas upon return to home. No skilled ST recommended at this time.    SLP Assessment  SLP Recommendation/Assessment: Patient does not need any further Speech Lanaguage Pathology Services SLP Visit Diagnosis: Dysarthria and anarthria (R47.1)    Follow Up Recommendations       Frequency and Duration           SLP Evaluation Cognition  Overall Cognitive Status: Within Functional Limits for tasks assessed Arousal/Alertness: Awake/alert Orientation Level: Oriented X4 Attention: Selective Selective Attention: Appears intact Memory: Appears intact Awareness: Appears intact Problem Solving: Appears intact Executive Function: Reasoning;Sequencing;Organizing;Decision Making;Initiating Reasoning: Appears intact Sequencing: Appears intact Organizing: Appears intact Decision Making: Appears intact Initiating: Appears intact Safety/Judgment: Appears intact       Comprehension  Auditory Comprehension Overall Auditory Comprehension: Appears within functional limits for tasks assessed Yes/No Questions: Within Functional Limits Visual Recognition/Discrimination Discrimination: Not tested Reading Comprehension Reading Status: Not tested    Expression Expression Primary Mode of Expression: Verbal Verbal Expression Overall Verbal Expression: Appears within functional limits for tasks assessed Initiation: No impairment Repetition: No impairment Naming: No impairment Pragmatics: No impairment Written Expression Dominant Hand: Right Written Expression: Not tested   Oral / Motor  Oral Motor/Sensory Function Overall Oral Motor/Sensory Function: Mild impairment Facial ROM: Reduced right (minimal) Facial Symmetry: Abnormal symmetry right (minimal) Facial Strength: Within Functional Limits Facial  Sensation: Within Functional Limits Lingual ROM: Within Functional Limits Lingual Symmetry: Within Functional Limits Lingual Strength: Within Functional Limits Velum: Within  Functional Limits Mandible: Within Functional Limits Motor Speech Overall Motor Speech: Appears within functional limits for tasks assessed Respiration: Within functional limits Phonation: Normal Resonance: Within functional limits Articulation:  (minimal dysarthria; pt reports her speech is at baseline) Intelligibility: Intelligible Motor Planning: Witnin functional limits Motor Speech Errors: Not applicable   [place], [place], [place] Pathologist [phone]         Aliene Altes 10/16/2016, 6:24 PM

## 2016-10-16 NOTE — Progress Notes (Signed)
PROGRESS NOTE    Rose Greer  PPI:[id number] DOB: [birth year] DOA: 10/15/2016 PCP: [facility]   Brief Narrative:  61 y.o. BF PMHx Brain aneurysm, HTN, HLD, DM type II, Tobacco abuse  Presents She states she went to bed yesterday at around 11 PM and was feeling "normal" other than a pounding headache. When she woke up this morning she felt her right foot was numb and had difficulty standing. When she went to brush her teeth she was unable to grip the toothbrush in her right hand. Her significant other noticed slurred speech and a right facial droop at which time she was brought into the hospital for evaluation. Code stroke was not called given uncertain time of symptom initiation. She takes 81 mg of aspirin on a daily basis and has done so for many years. Upon arrival to the hospital she was noted to have a blood pressure of 240/120, labs that are essentially unremarkable, a tox screen that is only positive for marijuana, negative urinalysis, CT scan of the head is normal. Given persistence of her neurologic findings and marked hypertension admission is requested. Due to lack of MRI over the weekend and lack of neurology coverage over the next week at [facility], transfer to [place] is requested.   Subjective: 7/15 A/O 4, negative CP, negative SOB, negative N/V, negative headache. States still smokes but has been trying to stop.    Assessment & Plan:   Principal Problem:   Right sided weakness Active Problems:   DM (diabetes mellitus) (H[facility])   Chronic diastolic CHF (congestive heart failure) (H[facility])   HTN (hypertension)   Hyperlipidemia   Tobacco abuse   Hypertensive emergency   Right-sided weakness -Given persistence of symptoms, I suspect she has had a left brain CVA. -CT scan of the head is negative. -MRI of the brain positive for acute stroke C results below -, carotid Dopplers, 2-D echo, check lipids. -PT/OT/ST evaluations -Given lack of MRI and  neurology coverage at [facility], will proceed to transfer to [place] aspirin suppository for now, if passes swallow evaluation will likely need to start on Plavix. -Consider neurology evaluation pending results of MRI.  Chronic systolic and diastolic CHF -Strict in and out -Daily weight   Hypertensive emergency -Blood pressure at arrival was 240/120. -She was given 1 dose of IV labetalol with good results, however not lasting. -Titrate nitroglycerin drip to maintain SBP 160-180 given patient's new CVA  -Coreg 25 mg  BID -Lasix 40 mg daily -Hydralazine 50 mg TID -Imdur 30 mg daily  Hyperlipidemia -Lipid panel not within ADA/AHA guidelines  -DC Pravastatin  -Start Lipitor 40 mg daily titrate up to 80 mg daily   Diabetes type II uncontrolled with complications -4/6 Hemoglobin A1c= 9.0. -Continue home dose of Levemir and start on a moderate sliding scale.  Tobacco abuse -Counseled on cessation. -Refuses nicotine patch for now.   DVT prophylaxis: Lovenox Code Status: Full Family Communication: None Disposition Plan: CIR vs SNF   Consultants:  None  Procedures/Significant Events:  7/14 CT head WO contrast: Negative for acute infarct 7/14 Echocardiogram: LVEF=:50%. Mild diffuse hypokinesis. -(grade 1 diastolic dysfunction).  - Pulmonary arteries:  PA peak pressure: 30 mm Hg (S). 7/15 MRI brain W contrast/MRA: MRI HEAD:-Acute LEFT thalamus 6 mm lacunar infarct.- Acute vs subacute LEFT putaminal 4 mm lacunar infarct. -Mild chronic small vessel ischemic disease. MRA HEAD: -3 mm LEFT PCOM aneurysm ;     VENTILATOR SETTINGS: None  Cultures None  Antimicrobials: None   Devices None   LINES / TUBES:  None    Continuous Infusions: . sodium chloride 50 mL/hr at 10/15/16 2000  . nitroGLYCERIN 30 mcg/min (10/15/16 2014)     Objective: Vitals:   10/16/16 0500 10/16/16 0530 10/16/16 0600 10/16/16 0729  BP: (!) 163/82 140/85 (!)  149/85 (!) 172/83  Pulse: 69 74 67   Resp: 18 (!) 23 19   Temp:    98.4 F (36.9 C)  TempSrc:    Oral  SpO2: 95% 92% 92%   Weight:      Height:        Intake/Output Summary (Last 24 hours) at 10/16/16 0738 Last data filed at 10/16/16 0600  Gross per 24 hour  Intake           712.46 ml  Output             1450 ml  Net          -737.54 ml   Filed Weights   10/15/16 0933 10/15/16 1710 10/16/16 0433  Weight: 148 lb (67.1 kg) 151 lb 3.8 oz (68.6 kg) 143 lb 15.4 oz (65.3 kg)    Examination:  General: A/O 4, No acute respiratory distress Eyes: negative scleral hemorrhage, negative anisocoria, negative icterus ENT: Negative Runny nose, negative gingival bleeding, poor dentation Neck:  Negative scars, masses, torticollis, lymphadenopathy, JVD Lungs: Clear to auscultation bilaterally without wheezes or crackles Cardiovascular: Regular rate and rhythm without murmur gallop or rub normal S1 and S2 Abdomen: negative abdominal pain, nondistended, positive soft, bowel sounds, no rebound, no ascites, no appreciable mass Extremities: No significant cyanosis, clubbing, or edema bilateral lower extremities Skin: Negative rashes, lesions, ulcers Psychiatric:  Negative depression, negative anxiety, negative fatigue, negative mania  Central nervous system:  Cranial nerves II through XII intact, tongue/uvula midline, all extremities muscle strength on left 5/5, left side sensation intact throughout, RUE/RLE strength 4/5, sensation decreased on RLE, finger nose finger bilateral pass pointing Rt>>Lt , quick finger touch on left within normal limits/some difficulty on the right, heel to shin bilateral able to perform but some difficulty, negative dysarthria, negative expressive aphasia, negative receptive aphasia.  .     Data Reviewed: Care during the described time interval was provided by me .  I have reviewed this patient's available data, including medical history, events of note, physical  examination, and all test results as part of my evaluation. I have personally reviewed and interpreted all radiology studies.  CBC:  Recent Labs Lab 10/15/16 0944 10/15/16 0951  WBC 8.8  --   NEUTROABS 4.5  --   HGB 15.4* 17.3*  HCT 46.6* 51.0*  MCV 86.9  --   PLT 201  --    Basic Metabolic Panel:  Recent Labs Lab 10/15/16 0944 10/15/16 0951  NA 134* 138  K 3.9 4.0  CL 99* 98*  CO2 24  --   GLUCOSE 325* 324*  BUN 7 5*  CREATININE 0.62 0.50  CALCIUM 9.3  --    GFR: Estimated Creatinine Clearance: 66.3 mL/min (by C-G formula based on SCr of 0.5 mg/dL). Liver Function Tests:  Recent Labs Lab 10/15/16 0944  AST 34  ALT 20  ALKPHOS 91  BILITOT 0.7  PROT 8.4*  ALBUMIN 3.9   No results for input(s): LIPASE, AMYLASE in the last 168 hours. No results for input(s): AMMONIA in the last 168 hours. Coagulation Profile:  Recent Labs Lab 10/15/16 0944  INR 0.96   Cardiac  Enzymes: No results for input(s): CKTOTAL, CKMB, CKMBINDEX, TROPONINI in the last 168 hours. BNP (last 3 results) No results for input(s): PROBNP in the last 8760 hours. HbA1C: No results for input(s): HGBA1C in the last 72 hours. CBG:  Recent Labs Lab 10/15/16 0936 10/15/16 1733 10/15/16 2129  GLUCAP 340* 136* 140*   Lipid Profile:  Recent Labs  10/16/16 0235  CHOL 194  HDL 22*  LDLCALC 126*  TRIG 228*  CHOLHDL 8.8   Thyroid Function Tests: No results for input(s): TSH, T4TOTAL, FREET4, T3FREE, THYROIDAB in the last 72 hours. Anemia Panel: No results for input(s): VITAMINB12, FOLATE, FERRITIN, TIBC, IRON, RETICCTPCT in the last 72 hours. Urine analysis:    Component Value Date/Time   COLORURINE STRAW (A) 10/15/2016 1105   APPEARANCEUR CLEAR 10/15/2016 1105   LABSPEC 1.009 10/15/2016 1105   PHURINE 8.0 10/15/2016 1105   GLUCOSEU >=500 (A) 10/15/2016 1105   HGBUR NEGATIVE 10/15/2016 1105   BILIRUBINUR NEGATIVE 10/15/2016 1105   KETONESUR NEGATIVE 10/15/2016 1105    PROTEINUR NEGATIVE 10/15/2016 1105   NITRITE NEGATIVE 10/15/2016 1105   LEUKOCYTESUR NEGATIVE 10/15/2016 1105   Sepsis Labs: @LABRCNTIP (procalcitonin:4,lacticidven:4)  ) Recent Results (from the past 240 hour(s))  MRSA PCR Screening     Status: None   Collection Time: 10/15/16  6:31 PM  Result Value Ref Range Status   MRSA by PCR NEGATIVE NEGATIVE Final    Comment:        The GeneXpert MRSA Assay (FDA approved for NASAL specimens only), is one component of a comprehensive MRSA colonization surveillance program. It is not intended to diagnose MRSA infection nor to guide or monitor treatment for MRSA infections.          Radiology Studies: Ct Head Wo Contrast  Result Date: 10/15/2016 CLINICAL DATA:  Pt reports waking at 0730 this morning with right sided weakness and numbness. States she was last seen normal at 2300 last night. EXAM: CT HEAD WITHOUT CONTRAST TECHNIQUE: Contiguous axial images were obtained from the base of the skull through the vertex without intravenous contrast. COMPARISON:  None. FINDINGS: Brain: No evidence of acute infarction, hemorrhage, hydrocephalus, extra-axial collection or mass lesion/mass effect. Vascular: No hyperdense vessel or unexpected calcification. Skull: Normal. Negative for fracture or focal lesion. Sinuses/Orbits: Globes and orbits are unremarkable. Visualized sinuses and mastoid air cells are clear. Other: None. IMPRESSION: Normal unenhanced CT scan of the brain. Electronically Signed   By: Lajean Manes M.D.   On: 10/15/2016 10:17   Mr Brain Wo Contrast  Result Date: 10/16/2016 CLINICAL DATA:  Headache, slurred speech and RIGHT facial droop, follow-up stroke. History of brain aneurysm, hypertension, hyperlipidemia. EXAM: MRI HEAD WITHOUT CONTRAST MRA HEAD WITHOUT CONTRAST TECHNIQUE: Multiplanar, multiecho pulse sequences of the brain and surrounding structures were obtained without intravenous contrast. Angiographic images of the head were  obtained using MRA technique without contrast. COMPARISON:  None. CT HEAD October 15, 2016 FINDINGS: MRI HEAD FINDINGS BRAIN: 6 mm reduced diffusion LEFT posterior thalamus with low ADC values. 4 mm reduced diffusion on LEFT putamen suspected with low ADC values on the coronal sequence. Associated T2 hyperintense signal within bilateral infarct. A few scattered subcentimeter supratentorial white matter FLAIR T2 hyperintensities. Ventricles and sulci are are normal for age. No midline shift, mass effect or masses. No susceptibility artifact to suggest hemorrhage. No abnormal extra-axial fluid collections. VASCULAR: Normal major intracranial vascular flow voids present at skull base. SKULL AND UPPER CERVICAL SPINE: No abnormal sellar expansion. No suspicious calvarial bone marrow  signal. Craniocervical junction maintained. SINUSES/ORBITS: Trace mastoid effusions. Trace paranasal sinus mucosal thickening. The included ocular globes and orbital contents are non-suspicious. OTHER: Patient is edentulous. MRA HEAD FINDINGS ANTERIOR CIRCULATION: Normal flow related enhancement of the included cervical, petrous, cavernous and supraclinoid internal carotid arteries. 3 mm posteriorly directed aneurysm LEFT supraclinoid segment, posteriorly directed though, difficult to characterize on maximum intensity projected reformations. Patent anterior communicating artery. Patent anterior and middle cerebral arteries, including distal segments. No large vessel occlusion, high-grade stenosis, aneurysm. POSTERIOR CIRCULATION: LEFT vertebral artery is dominant. Basilar artery is patent, with normal flow related enhancement of the main branch vessels. Patent posterior cerebral arteries. Robust bilateral posterior communicating artery is present. No large vessel occlusion, high-grade stenosis,  aneurysm. ANATOMIC VARIANTS: None. Source images and MIP images were reviewed. IMPRESSION: MRI HEAD: 1. Acute LEFT thalamus 6 mm lacunar infarct. 2.  Acute versus subacute LEFT putaminal 4 mm lacunar infarct. 3. Mild chronic small vessel ischemic disease. MRA HEAD: 1. No emergent large vessel occlusion or severe stenosis. 2. 3 mm LEFT PCOM aneurysm ; given patient's history of brain aneurysm, recommend correlation with prior imaging. Electronically Signed   By: Elon Alas M.D.   On: 10/16/2016 04:30   Mr Jodene Nam Head/brain AT Cm  Result Date: 10/16/2016 CLINICAL DATA:  Headache, slurred speech and RIGHT facial droop, follow-up stroke. History of brain aneurysm, hypertension, hyperlipidemia. EXAM: MRI HEAD WITHOUT CONTRAST MRA HEAD WITHOUT CONTRAST TECHNIQUE: Multiplanar, multiecho pulse sequences of the brain and surrounding structures were obtained without intravenous contrast. Angiographic images of the head were obtained using MRA technique without contrast. COMPARISON:  None. CT HEAD October 15, 2016 FINDINGS: MRI HEAD FINDINGS BRAIN: 6 mm reduced diffusion LEFT posterior thalamus with low ADC values. 4 mm reduced diffusion on LEFT putamen suspected with low ADC values on the coronal sequence. Associated T2 hyperintense signal within bilateral infarct. A few scattered subcentimeter supratentorial white matter FLAIR T2 hyperintensities. Ventricles and sulci are are normal for age. No midline shift, mass effect or masses. No susceptibility artifact to suggest hemorrhage. No abnormal extra-axial fluid collections. VASCULAR: Normal major intracranial vascular flow voids present at skull base. SKULL AND UPPER CERVICAL SPINE: No abnormal sellar expansion. No suspicious calvarial bone marrow signal. Craniocervical junction maintained. SINUSES/ORBITS: Trace mastoid effusions. Trace paranasal sinus mucosal thickening. The included ocular globes and orbital contents are non-suspicious. OTHER: Patient is edentulous. MRA HEAD FINDINGS ANTERIOR CIRCULATION: Normal flow related enhancement of the included cervical, petrous, cavernous and supraclinoid internal carotid  arteries. 3 mm posteriorly directed aneurysm LEFT supraclinoid segment, posteriorly directed though, difficult to characterize on maximum intensity projected reformations. Patent anterior communicating artery. Patent anterior and middle cerebral arteries, including distal segments. No large vessel occlusion, high-grade stenosis, aneurysm. POSTERIOR CIRCULATION: LEFT vertebral artery is dominant. Basilar artery is patent, with normal flow related enhancement of the main branch vessels. Patent posterior cerebral arteries. Robust bilateral posterior communicating artery is present. No large vessel occlusion, high-grade stenosis,  aneurysm. ANATOMIC VARIANTS: None. Source images and MIP images were reviewed. IMPRESSION: MRI HEAD: 1. Acute LEFT thalamus 6 mm lacunar infarct. 2. Acute versus subacute LEFT putaminal 4 mm lacunar infarct. 3. Mild chronic small vessel ischemic disease. MRA HEAD: 1. No emergent large vessel occlusion or severe stenosis. 2. 3 mm LEFT PCOM aneurysm ; given patient's history of brain aneurysm, recommend correlation with prior imaging. Electronically Signed   By: Elon Alas M.D.   On: 10/16/2016 04:30        Scheduled Meds: .  stroke:  mapping our early stages of recovery book   Does not apply Once  . aspirin  300 mg Rectal Daily   Or  . aspirin  325 mg Oral Daily  . carvedilol  25 mg Oral BID  . citalopram  40 mg Oral Daily  . enoxaparin (LOVENOX) injection  40 mg Subcutaneous Q24H  . furosemide  40 mg Oral Daily  . glipiZIDE  5 mg Oral BID  . hydrALAZINE  50 mg Oral Q8H  . insulin aspart  0-15 Units Subcutaneous TID WC  . insulin aspart  0-5 Units Subcutaneous QHS  . insulin aspart  4 Units Subcutaneous TID WC  . insulin detemir  16 Units Subcutaneous Daily  . isosorbide mononitrate  30 mg Oral Daily  . mirtazapine  45 mg Oral QHS  . pantoprazole  40 mg Oral Daily  . potassium chloride SA  20 mEq Oral Daily  . pravastatin  40 mg Oral Daily  . risperiDONE  2 mg  Oral QHS   Continuous Infusions: . sodium chloride 50 mL/hr at 10/15/16 2000  . nitroGLYCERIN 30 mcg/min (10/15/16 2014)     LOS: 1 day    Time spent: 40 minutes   WOODS, Geraldo Docker, MD [facility] Pager [phone]   If 7PM-7AM, please contact night-coverage [email or web address] Password [facility] 10/16/2016, 7:38 AM

## 2016-10-16 NOTE — Evaluation (Signed)
Physical Therapy Evaluation Patient Details Name: Rose Greer MRN: [id number] DOB: 05-11-1955 Today's Date: 10/16/2016   History of Present Illness  Pt 61 yo female admitted through ED with right sided weakness and facial droop. Pt reports she went to bed with a headache and woke up with weakness. Pt was found to have a L thalamus and L putaminal acute stroke via MRI. PMH significant for HTN, HLD, DM2, brain aneurysm, GERD.   Clinical Impression  Pt presents with the above diagnosis and below deficits for therapy evaluation. Prior to admission, pt lived with her husband and son in a single level home with steps leading into the front door. Pt was completely independent prior to admission. Pt requires Mod A for all mobility this session due to recent onset of right dominant sided weakness. Pt will benefit from continued acute PT services in order to address the below deficits prior to discharge to venue recommended below.     Follow Up Recommendations SNF    Equipment Recommendations  Rolling walker with 5" wheels    Recommendations for Other Services OT consult     Precautions / Restrictions Precautions Precautions: Fall Restrictions Weight Bearing Restrictions: No      Mobility  Bed Mobility Overal bed mobility: Needs Assistance Bed Mobility: Supine to Sit     Supine to sit: Mod assist     General bed mobility comments: Mod A with assistance to bring RLE EOB and to position trunk upright at EOB  Transfers Overall transfer level: Needs assistance Equipment used: Rolling walker (2 wheeled) Transfers: Sit to/from [facility] Sit to Stand: Mod assist Stand pivot transfers: Mod assist       General transfer comment: Mod A to stand and Mod A to pivot transfer from bed to recliner using RW for assistance. Pt has an episode of right knee buckeling with transfer before sitting in recliner. Min A to stabilize.   Ambulation/Gait             General Gait  Details: Did not attempt this session  Stairs            Wheelchair Mobility    Modified Rankin (Stroke Patients Only)       Balance Overall balance assessment: Needs assistance Sitting-balance support: Single extremity supported;Feet supported Sitting balance-Leahy Scale: Fair   Postural control: Right lateral lean Standing balance support: Bilateral upper extremity supported Standing balance-Leahy Scale: Poor Standing balance comment: reliant on UE support in standing                             Pertinent Vitals/Pain Pain Assessment: 0-10 Pain Score: 7  Pain Location: Head Pain Descriptors / Indicators: Headache Pain Intervention(s): Monitored during session    Home Living Family/patient expects to be discharged to:: Private residence Living Arrangements: Spouse/significant other;Children Available Help at Discharge: Family;Available 24 hours/day Type of Home: House Home Access: Stairs to enter   [employer] of Steps: 2 Home Layout: Two level;Able to live on main level with bedroom/bathroom Home Equipment: None      Prior Function Level of Independence: Independent         Comments: completely independent, driving and doing for herself prior to admission     Hand Dominance   Dominant Hand: Right    Extremity/Trunk Assessment   Upper Extremity Assessment Upper Extremity Assessment: Defer to OT evaluation    Lower Extremity Assessment Lower Extremity Assessment: RLE deficits/detail RLE Deficits /  Details: Pt with weakness in ankle DF at least 2/5, knee flex/ext of 2/5 and hip flex 2/5. Pt's uses ERs (sartorious) to perform hip flexion on RLE.     Cervical / Trunk Assessment Cervical / Trunk Assessment: Normal  Communication   Communication: No difficulties  Cognition Arousal/Alertness: Awake/alert Behavior During Therapy: WFL for tasks assessed/performed Overall Cognitive Status: Within Functional Limits for tasks  assessed                                        General Comments      Exercises     Assessment/Plan    PT Assessment Patient needs continued PT services  PT Problem List Decreased strength;Decreased activity tolerance;Decreased balance;Decreased mobility;Decreased knowledge of use of DME;Decreased safety awareness       PT Treatment Interventions DME instruction;Gait training;Functional mobility training;Therapeutic activities;Therapeutic exercise;Balance training;Stair training;Patient/family education;Neuromuscular re-education    PT Goals (Current goals can be found in the Care Plan section)  Acute Rehab PT Goals Patient Stated Goal: to be able to move better PT Goal Formulation: With patient Time For Goal Achievement: 10/23/16 Potential to Achieve Goals: Good    Frequency Min 5X/week   Barriers to discharge Decreased caregiver support husband works    Co-evaluation               AM-PAC PT "6 Clicks" Daily Activity  Outcome Measure Difficulty turning over in bed (including adjusting bedclothes, sheets and blankets)?: Total Difficulty moving from lying on back to sitting on the side of the bed? : Total Difficulty sitting down on and standing up from a chair with arms (e.g., wheelchair, bedside commode, etc,.)?: Total Help needed moving to and from a bed to chair (including a wheelchair)?: A Lot Help needed walking in hospital room?: A Lot Help needed climbing 3-5 steps with a railing? : Total 6 Click Score: 8    End of Session Equipment Utilized During Treatment: Gait belt Activity Tolerance: Patient tolerated treatment well Patient left: in chair;with call bell/phone within reach Nurse Communication: Mobility status PT Visit Diagnosis: Unsteadiness on feet (R26.81);Muscle weakness (generalized) (M62.81);Other abnormalities of gait and mobility (R26.89);Hemiplegia and hemiparesis Hemiplegia - Right/Left: Right Hemiplegia -  dominant/non-dominant: Dominant Hemiplegia - caused by: Cerebral infarction    Time: 6767-2094 PT Time Calculation (min) (ACUTE ONLY): 22 min   Charges:   PT Evaluation $PT Eval Moderate Complexity: 1 Procedure     PT G Codes:        Scheryl Marten PT, DPT  [phone]   Shanon Rosser 10/16/2016, 1:31 PM

## 2016-10-17 ENCOUNTER — Inpatient Hospital Stay (HOSPITAL_COMMUNITY): Payer: Medicare Other

## 2016-10-17 DIAGNOSIS — I5042 Chronic combined systolic (congestive) and diastolic (congestive) heart failure: Secondary | ICD-10-CM

## 2016-10-17 DIAGNOSIS — I639 Cerebral infarction, unspecified: Secondary | ICD-10-CM

## 2016-10-17 LAB — VAS US CAROTID
LEFT ECA DIAS: -8 cm/s
LEFT VERTEBRAL DIAS: -15 cm/s
LICAPDIAS: 20 cm/s
Left CCA dist dias: -17 cm/s
Left CCA dist sys: -69 cm/s
Left CCA prox dias: 20 cm/s
Left CCA prox sys: 85 cm/s
Left ICA dist dias: -27 cm/s
Left ICA dist sys: -85 cm/s
Left ICA prox sys: 68 cm/s
RIGHT ECA DIAS: -8 cm/s
RIGHT VERTEBRAL DIAS: -6 cm/s
Right CCA prox dias: 16 cm/s
Right CCA prox sys: 73 cm/s
Right cca dist sys: 63 cm/s

## 2016-10-17 LAB — BASIC METABOLIC PANEL
ANION GAP: 7 (ref 5–15)
BUN: 11 mg/dL (ref 6–20)
CALCIUM: 8.7 mg/dL — AB (ref 8.9–10.3)
CO2: 23 mmol/L (ref 22–32)
Chloride: 108 mmol/L (ref 101–111)
Creatinine, Ser: 0.76 mg/dL (ref 0.44–1.00)
Glucose, Bld: 182 mg/dL — ABNORMAL HIGH (ref 65–99)
Potassium: 3.4 mmol/L — ABNORMAL LOW (ref 3.5–5.1)
SODIUM: 138 mmol/L (ref 135–145)

## 2016-10-17 LAB — GLUCOSE, CAPILLARY
GLUCOSE-CAPILLARY: 172 mg/dL — AB (ref 65–99)
GLUCOSE-CAPILLARY: 142 mg/dL — AB (ref 65–99)
GLUCOSE-CAPILLARY: 220 mg/dL — AB (ref 65–99)
GLUCOSE-CAPILLARY: 188 mg/dL — AB (ref 65–99)

## 2016-10-17 LAB — MAGNESIUM: Magnesium: 1.8 mg/dL (ref 1.7–2.4)

## 2016-10-17 MED ORDER — HYDRALAZINE HCL 50 MG PO TABS
50.0000 mg | ORAL_TABLET | Freq: Four times a day (QID) | ORAL | Status: DC
  Administered 2016-10-17 – 2016-10-18 (×4): 50 mg via ORAL
  Filled 2016-10-17 (×4): qty 1

## 2016-10-17 MED ORDER — HYDRALAZINE HCL 50 MG PO TABS: 25.0000 mg | ORAL_TABLET | Freq: Three times a day (TID) | ORAL | Status: DC

## 2016-10-17 MED ORDER — CLONIDINE HCL 0.2 MG/24HR TD PTWK
0.2000 mg | MEDICATED_PATCH | TRANSDERMAL | Status: DC
  Administered 2016-10-17: 0.2 mg via TRANSDERMAL
  Filled 2016-10-17 (×2): qty 1

## 2016-10-17 MED ORDER — HYDRALAZINE HCL 50 MG PO TABS: 50.0000 mg | ORAL_TABLET | Freq: Three times a day (TID) | ORAL | Status: DC

## 2016-10-17 MED ORDER — CLONIDINE HCL 0.2 MG/24HR TD PTWK: 0.2000 mg | MEDICATED_PATCH | TRANSDERMAL | Status: DC

## 2016-10-17 MED ORDER — POTASSIUM CHLORIDE CRYS ER 20 MEQ PO TBCR
60.0000 meq | EXTENDED_RELEASE_TABLET | Freq: Once | ORAL | Status: AC
  Administered 2016-10-17: 60 meq via ORAL
  Filled 2016-10-17: qty 3

## 2016-10-17 MED ORDER — HYDRALAZINE HCL 10 MG PO TABS
10.0000 mg | ORAL_TABLET | Freq: Once | ORAL | Status: AC
  Administered 2016-10-17: 10 mg via ORAL
  Filled 2016-10-17: qty 1

## 2016-10-17 MED ORDER — HYDRALAZINE HCL 50 MG PO TABS: 35.0000 mg | ORAL_TABLET | Freq: Three times a day (TID) | ORAL | Status: DC

## 2016-10-17 MED ORDER — LISINOPRIL 10 MG PO TABS
10.0000 mg | ORAL_TABLET | Freq: Two times a day (BID) | ORAL | Status: DC
  Administered 2016-10-17 – 2016-10-19 (×5): 10 mg via ORAL
  Filled 2016-10-17 (×5): qty 1

## 2016-10-17 MED ORDER — CLONIDINE HCL 0.2 MG/24HR TD PTWK
0.2000 mg | MEDICATED_PATCH | TRANSDERMAL | Status: DC
  Filled 2016-10-17: qty 1

## 2016-10-17 NOTE — Progress Notes (Signed)
Physical Therapy Discharge Patient Details Name: Gerrica Cygan MRN: [id number] DOB: 1955-09-07 Today's Date: 10/17/2016 Time: 6986-1483 PT Time Calculation (min) (ACUTE ONLY): 8 min  Patient discharged from PT services secondary to goals met and no further PT needs identified.  Please see latest therapy progress note for current level of functioning and progress toward goals.    Progress and discharge plan discussed with patient and/or caregiver: Patient/Caregiver agrees with plan  GP     Denice Paradise 10/17/2016, 1:32 PM  [facility] [phone] (pager)

## 2016-10-17 NOTE — Progress Notes (Signed)
*  PRELIMINARY RESULTS* Vascular Ultrasound Carotid Duplex (Doppler) has been completed.  Preliminary findings: Bilateral 1-39% ICA stenosis, antegrade vertebral flow.  Low level echoes seen distal left common carotid artery, can not rule out soft plaque vs thrombus.   Everrett Coombe 10/17/2016, 12:36 PM

## 2016-10-17 NOTE — Progress Notes (Signed)
PROGRESS NOTE    Rose Greer  IOE:[id number] DOB: 1955/09/01 DOA: 10/15/2016 PCP: [facility]   Brief Narrative:  61 y.o. BF PMHx Brain aneurysm, HTN, HLD, DM type II, Tobacco abuse  Presents She states she went to bed yesterday at around 11 PM and was feeling "normal" other than a pounding headache. When she woke up this morning she felt her right foot was numb and had difficulty standing. When she went to brush her teeth she was unable to grip the toothbrush in her right hand. Her significant other noticed slurred speech and a right facial droop at which time she was brought into the hospital for evaluation. Code stroke was not called given uncertain time of symptom initiation. She takes 81 mg of aspirin on a daily basis and has done so for many years. Upon arrival to the hospital she was noted to have a blood pressure of 240/120, labs that are essentially unremarkable, a tox screen that is only positive for marijuana, negative urinalysis, CT scan of the head is normal. Given persistence of her neurologic findings and marked hypertension admission is requested. Due to lack of MRI over the weekend and lack of neurology coverage over the next week at [facility], transfer to [place] is requested.   Subjective: 7/16  A/O 4, negative CP, negative SOB, negative N/V, negative headache. States ambulated with PT today without any difficulty with balance or strength.     Assessment & Plan:   Principal Problem:   Right sided weakness Active Problems:   DM (diabetes mellitus) (HCC)   Chronic diastolic CHF (congestive heart failure) (HCC)   HTN (hypertension)   Hyperlipidemia   Tobacco abuse   Hypertensive emergency   Acute CVA with Right-sided weakness -Given persistence of symptoms, I suspect she has had a left brain CVA. -CT scan of the head is negative. -MRI of the brain positive for acute stroke see results below  Chronic Systolic and Diastolic  CHF -Strict in and out -Daily weight -Coreg 25 mg  BID -Lasix 40 mg daily -7/16 increase Hydralazine 50 mg QID -Imdur 30 mg daily -7/16 start Lisinopril 10 mg BID (supposedly causes confusion, seriously doubt) -Titrate nitroglycerin drip to maintain SBP 160-180 given patient's new CVA   Hypertensive emergency -Blood pressure at arrival was 240/120. -See CHF -  Hyperlipidemia -Lipid panel not within ADA/AHA guidelines  -DC Pravastatin  -Start Lipitor 40 mg daily titrate up to 80 mg daily   Diabetes type II uncontrolled with complications -4/6 Hemoglobin A1c= 9.0. -Levemir 16 units daily -NovoLog 4 units QAC -Moderate SSI.  Tobacco abuse -Counseled on cessation. -Refuses nicotine patch for now.  Hypokalemia -Potassium goal> 4 -K-Dur 60 mEq   DVT prophylaxis: Lovenox Code Status: Full Family Communication: None Disposition Plan: CIR vs SNF   Consultants:  None  Procedures/Significant Events:  7/14 CT head WO contrast: Negative for acute infarct 7/14 Echocardiogram: LVEF=:50%. Mild diffuse hypokinesis. -(grade 1 diastolic dysfunction).  - Pulmonary arteries:  PA peak pressure: 30 mm Hg (S). 7/15 MRI brain W contrast/MRA: MRI HEAD:-Acute LEFT thalamus 6 mm lacunar infarct.- Acute vs subacute LEFT putaminal 4 mm lacunar infarct. -Mild chronic small vessel ischemic disease. MRA HEAD: -3 mm LEFT PCOM aneurysm ;     VENTILATOR SETTINGS: None   Cultures None  Antimicrobials: None   Devices None   LINES / TUBES:  None    Continuous Infusions: . sodium chloride 50 mL/hr at 10/16/16 2000  . nitroGLYCERIN 10 mcg/min (10/17/16  7782)     Objective: Vitals:   10/16/16 2018 10/16/16 2300 10/17/16 0300 10/17/16 0500  BP: (!) 151/69 113/65 128/65   Pulse: 90 76 73   Resp: 20 (!) 21 (!) 24   Temp: 98.3 F (36.8 C) 98.2 F (36.8 C) 98.4 F (36.9 C)   TempSrc: Oral Oral Oral   SpO2: 97% 93% 94%   Weight:    146 lb 2.6 oz (66.3 kg)  Height:         Intake/Output Summary (Last 24 hours) at 10/17/16 0719 Last data filed at 10/17/16 0600  Gross per 24 hour  Intake             1134 ml  Output              850 ml  Net              284 ml   Filed Weights   10/15/16 1710 10/16/16 0433 10/17/16 0500  Weight: 151 lb 3.8 oz (68.6 kg) 143 lb 15.4 oz (65.3 kg) 146 lb 2.6 oz (66.3 kg)    Examination:  General: A/O 4, No acute respiratory distress Eyes: negative scleral hemorrhage, negative anisocoria, negative icterus ENT: Negative Runny nose, negative gingival bleeding, poor dentation Neck:  Negative scars, masses, torticollis, lymphadenopathy, JVD Lungs: Clear to auscultation bilaterally without wheezes or crackles Cardiovascular: Regular rate and rhythm without murmur gallop or rub normal S1 and S2 Abdomen: negative abdominal pain, nondistended, positive soft, bowel sounds, no rebound, no ascites, no appreciable mass Extremities: No significant cyanosis, clubbing, or edema bilateral lower extremities Skin: Negative rashes, lesions, ulcers Psychiatric:  Negative depression, negative anxiety, negative fatigue, negative mania  Central nervous system:  Cranial nerves II through XII intact, tongue/uvula midline, all extremities muscle strength 5/5, sensation intact throughout, finger nose finger bilateral within normal limits, quick finger touch bilateral within normal limits, heel to shin bilateral within normal limits, negative dysarthria, negative expressive aphasia, negative receptive aphasia.   .     Data Reviewed: Care during the described time interval was provided by me .  I have reviewed this patient's available data, including medical history, events of note, physical examination, and all test results as part of my evaluation. I have personally reviewed and interpreted all radiology studies.  CBC:  Recent Labs Lab 10/15/16 0944 10/15/16 0951  WBC 8.8  --   NEUTROABS 4.5  --   HGB 15.4* 17.3*  HCT 46.6* 51.0*   MCV 86.9  --   PLT 201  --    Basic Metabolic Panel:  Recent Labs Lab 10/15/16 0944 10/15/16 0951 10/16/16 0748 10/17/16 0330  NA 134* 138 135 138  K 3.9 4.0 4.2 3.4*  CL 99* 98* 105 108  CO2 24  --  20* 23  GLUCOSE 325* 324* 177* 182*  BUN 7 5* 6 11  CREATININE 0.62 0.50 0.62 0.76  CALCIUM 9.3  --  8.8* 8.7*  MG  --   --  1.8 1.8   GFR: Estimated Creatinine Clearance: 66.8 mL/min (by C-G formula based on SCr of 0.76 mg/dL). Liver Function Tests:  Recent Labs Lab 10/15/16 0944  AST 34  ALT 20  ALKPHOS 91  BILITOT 0.7  PROT 8.4*  ALBUMIN 3.9   No results for input(s): LIPASE, AMYLASE in the last 168 hours. No results for input(s): AMMONIA in the last 168 hours. Coagulation Profile:  Recent Labs Lab 10/15/16 0944  INR 0.96   Cardiac Enzymes: No results for input(s):  CKTOTAL, CKMB, CKMBINDEX, TROPONINI in the last 168 hours. BNP (last 3 results) No results for input(s): PROBNP in the last 8760 hours. HbA1C: No results for input(s): HGBA1C in the last 72 hours. CBG:  Recent Labs Lab 10/15/16 2129 10/16/16 0755 10/16/16 1131 10/16/16 1537 10/16/16 2152  GLUCAP 140* 218* 138* 187* 186*   Lipid Profile:  Recent Labs  10/16/16 0235  CHOL 194  HDL 22*  LDLCALC 126*  TRIG 228*  CHOLHDL 8.8   Thyroid Function Tests: No results for input(s): TSH, T4TOTAL, FREET4, T3FREE, THYROIDAB in the last 72 hours. Anemia Panel: No results for input(s): VITAMINB12, FOLATE, FERRITIN, TIBC, IRON, RETICCTPCT in the last 72 hours. Urine analysis:    Component Value Date/Time   COLORURINE STRAW (A) 10/15/2016 1105   APPEARANCEUR CLEAR 10/15/2016 1105   LABSPEC 1.009 10/15/2016 1105   PHURINE 8.0 10/15/2016 1105   GLUCOSEU >=500 (A) 10/15/2016 1105   HGBUR NEGATIVE 10/15/2016 1105   BILIRUBINUR NEGATIVE 10/15/2016 1105   KETONESUR NEGATIVE 10/15/2016 1105   PROTEINUR NEGATIVE 10/15/2016 1105   NITRITE NEGATIVE 10/15/2016 1105   LEUKOCYTESUR NEGATIVE  10/15/2016 1105   Sepsis Labs: @LABRCNTIP (procalcitonin:4,lacticidven:4)  ) Recent Results (from the past 240 hour(s))  MRSA PCR Screening     Status: None   Collection Time: 10/15/16  6:31 PM  Result Value Ref Range Status   MRSA by PCR NEGATIVE NEGATIVE Final    Comment:        The GeneXpert MRSA Assay (FDA approved for NASAL specimens only), is one component of a comprehensive MRSA colonization surveillance program. It is not intended to diagnose MRSA infection nor to guide or monitor treatment for MRSA infections.          Radiology Studies: Ct Head Wo Contrast  Result Date: 10/15/2016 CLINICAL DATA:  Pt reports waking at 0730 this morning with right sided weakness and numbness. States she was last seen normal at 2300 last night. EXAM: CT HEAD WITHOUT CONTRAST TECHNIQUE: Contiguous axial images were obtained from the base of the skull through the vertex without intravenous contrast. COMPARISON:  None. FINDINGS: Brain: No evidence of acute infarction, hemorrhage, hydrocephalus, extra-axial collection or mass lesion/mass effect. Vascular: No hyperdense vessel or unexpected calcification. Skull: Normal. Negative for fracture or focal lesion. Sinuses/Orbits: Globes and orbits are unremarkable. Visualized sinuses and mastoid air cells are clear. Other: None. IMPRESSION: Normal unenhanced CT scan of the brain. Electronically Signed   By: Lajean Manes M.D.   On: 10/15/2016 10:17   Mr Brain Wo Contrast  Result Date: 10/16/2016 CLINICAL DATA:  Headache, slurred speech and RIGHT facial droop, follow-up stroke. History of brain aneurysm, hypertension, hyperlipidemia. EXAM: MRI HEAD WITHOUT CONTRAST MRA HEAD WITHOUT CONTRAST TECHNIQUE: Multiplanar, multiecho pulse sequences of the brain and surrounding structures were obtained without intravenous contrast. Angiographic images of the head were obtained using MRA technique without contrast. COMPARISON:  None. CT HEAD October 15, 2016 FINDINGS:  MRI HEAD FINDINGS BRAIN: 6 mm reduced diffusion LEFT posterior thalamus with low ADC values. 4 mm reduced diffusion on LEFT putamen suspected with low ADC values on the coronal sequence. Associated T2 hyperintense signal within bilateral infarct. A few scattered subcentimeter supratentorial white matter FLAIR T2 hyperintensities. Ventricles and sulci are are normal for age. No midline shift, mass effect or masses. No susceptibility artifact to suggest hemorrhage. No abnormal extra-axial fluid collections. VASCULAR: Normal major intracranial vascular flow voids present at skull base. SKULL AND UPPER CERVICAL SPINE: No abnormal sellar expansion. No suspicious calvarial bone  marrow signal. Craniocervical junction maintained. SINUSES/ORBITS: Trace mastoid effusions. Trace paranasal sinus mucosal thickening. The included ocular globes and orbital contents are non-suspicious. OTHER: Patient is edentulous. MRA HEAD FINDINGS ANTERIOR CIRCULATION: Normal flow related enhancement of the included cervical, petrous, cavernous and supraclinoid internal carotid arteries. 3 mm posteriorly directed aneurysm LEFT supraclinoid segment, posteriorly directed though, difficult to characterize on maximum intensity projected reformations. Patent anterior communicating artery. Patent anterior and middle cerebral arteries, including distal segments. No large vessel occlusion, high-grade stenosis, aneurysm. POSTERIOR CIRCULATION: LEFT vertebral artery is dominant. Basilar artery is patent, with normal flow related enhancement of the main branch vessels. Patent posterior cerebral arteries. Robust bilateral posterior communicating artery is present. No large vessel occlusion, high-grade stenosis,  aneurysm. ANATOMIC VARIANTS: None. Source images and MIP images were reviewed. IMPRESSION: MRI HEAD: 1. Acute LEFT thalamus 6 mm lacunar infarct. 2. Acute versus subacute LEFT putaminal 4 mm lacunar infarct. 3. Mild chronic small vessel ischemic  disease. MRA HEAD: 1. No emergent large vessel occlusion or severe stenosis. 2. 3 mm LEFT PCOM aneurysm ; given patient's history of brain aneurysm, recommend correlation with prior imaging. Electronically Signed   By: Elon Alas M.D.   On: 10/16/2016 04:30   Mr Jodene Nam Head/brain JG Cm  Result Date: 10/16/2016 CLINICAL DATA:  Headache, slurred speech and RIGHT facial droop, follow-up stroke. History of brain aneurysm, hypertension, hyperlipidemia. EXAM: MRI HEAD WITHOUT CONTRAST MRA HEAD WITHOUT CONTRAST TECHNIQUE: Multiplanar, multiecho pulse sequences of the brain and surrounding structures were obtained without intravenous contrast. Angiographic images of the head were obtained using MRA technique without contrast. COMPARISON:  None. CT HEAD October 15, 2016 FINDINGS: MRI HEAD FINDINGS BRAIN: 6 mm reduced diffusion LEFT posterior thalamus with low ADC values. 4 mm reduced diffusion on LEFT putamen suspected with low ADC values on the coronal sequence. Associated T2 hyperintense signal within bilateral infarct. A few scattered subcentimeter supratentorial white matter FLAIR T2 hyperintensities. Ventricles and sulci are are normal for age. No midline shift, mass effect or masses. No susceptibility artifact to suggest hemorrhage. No abnormal extra-axial fluid collections. VASCULAR: Normal major intracranial vascular flow voids present at skull base. SKULL AND UPPER CERVICAL SPINE: No abnormal sellar expansion. No suspicious calvarial bone marrow signal. Craniocervical junction maintained. SINUSES/ORBITS: Trace mastoid effusions. Trace paranasal sinus mucosal thickening. The included ocular globes and orbital contents are non-suspicious. OTHER: Patient is edentulous. MRA HEAD FINDINGS ANTERIOR CIRCULATION: Normal flow related enhancement of the included cervical, petrous, cavernous and supraclinoid internal carotid arteries. 3 mm posteriorly directed aneurysm LEFT supraclinoid segment, posteriorly directed  though, difficult to characterize on maximum intensity projected reformations. Patent anterior communicating artery. Patent anterior and middle cerebral arteries, including distal segments. No large vessel occlusion, high-grade stenosis, aneurysm. POSTERIOR CIRCULATION: LEFT vertebral artery is dominant. Basilar artery is patent, with normal flow related enhancement of the main branch vessels. Patent posterior cerebral arteries. Robust bilateral posterior communicating artery is present. No large vessel occlusion, high-grade stenosis,  aneurysm. ANATOMIC VARIANTS: None. Source images and MIP images were reviewed. IMPRESSION: MRI HEAD: 1. Acute LEFT thalamus 6 mm lacunar infarct. 2. Acute versus subacute LEFT putaminal 4 mm lacunar infarct. 3. Mild chronic small vessel ischemic disease. MRA HEAD: 1. No emergent large vessel occlusion or severe stenosis. 2. 3 mm LEFT PCOM aneurysm ; given patient's history of brain aneurysm, recommend correlation with prior imaging. Electronically Signed   By: Elon Alas M.D.   On: 10/16/2016 04:30        Scheduled Meds: .  stroke: mapping our early stages of recovery book   Does not apply Once  . aspirin  300 mg Rectal Daily   Or  . aspirin  325 mg Oral Daily  . atorvastatin  40 mg Oral q1800  . carvedilol  25 mg Oral BID  . citalopram  40 mg Oral Daily  . enoxaparin (LOVENOX) injection  40 mg Subcutaneous Q24H  . furosemide  40 mg Oral Daily  . hydrALAZINE  50 mg Oral Q8H  . insulin aspart  0-15 Units Subcutaneous TID WC  . insulin aspart  0-5 Units Subcutaneous QHS  . insulin aspart  4 Units Subcutaneous TID WC  . insulin detemir  16 Units Subcutaneous Daily  . isosorbide mononitrate  30 mg Oral Daily  . mirtazapine  45 mg Oral QHS  . pantoprazole  40 mg Oral Daily  . potassium chloride SA  20 mEq Oral Daily  . risperiDONE  2 mg Oral QHS   Continuous Infusions: . sodium chloride 50 mL/hr at 10/16/16 2000  . nitroGLYCERIN 10 mcg/min (10/17/16  0616)     LOS: 2 days    Time spent: 40 minutes   Melis Trochez, Geraldo Docker, MD [facility] Pager [phone]   If 7PM-7AM, please contact night-coverage [email or web address] Password [facility] 10/17/2016, 7:19 AM

## 2016-10-17 NOTE — Progress Notes (Signed)
Physical Therapy Treatment and D/C Patient Details Name: Shaneque Merkle MRN: [id number] DOB: 10-10-1955 Today's Date: 10/17/2016    History of Present Illness Pt 61 yo female admitted through ED with right sided weakness and facial droop. Pt reports she went to bed with a headache and woke up with weakness. Pt was found to have a L thalamus and L putaminal acute stroke via MRI. PMH significant for HTN, HLD, DM2, brain aneurysm, GERD.     PT Comments    Pt admitted with above diagnosis. Pt currently without significant functional limitations and is independent with all mobility.  Scored 23/24 on DGI suggesting low risk of falls.  Pt will not need further skilled PT at this time. Will sign off as goals are met.   Follow Up Recommendations  No PT follow up     Equipment Recommendations  None recommended by PT    Recommendations for Other Services       Precautions / Restrictions Precautions Precautions: None Restrictions Weight Bearing Restrictions: No    Mobility  Bed Mobility               General bed mobility comments: pt seated EOB upon arrival  Transfers Overall transfer level: Independent Equipment used: None Transfers: Sit to/from [employer] to Stand: Independent         General transfer comment: no assist from bed or toilet  Ambulation/Gait Ambulation/Gait assistance: Independent Ambulation Distance (Feet): 450 Feet Assistive device: None Gait Pattern/deviations: WFL(Within Functional Limits)   Gait velocity interpretation: at or above normal speed for age/gender General Gait Details: No issues with gait.  STeady.    Stairs Stairs: Yes   Stair Management: Alternating pattern;Forwards;No rails Number of Stairs: 3 General stair comments: Pt able to ascend and descend steps without assist.   Wheelchair Mobility    Modified Rankin (Stroke Patients Only) Modified Rankin (Stroke Patients Only) Pre-Morbid Rankin Score: No  symptoms Modified Rankin: No symptoms     Balance Overall balance assessment: Independent Sitting-balance support: Single extremity supported;Feet supported Sitting balance-Leahy Scale: Good   Postural control: Right lateral lean Standing balance support: No upper extremity supported;During functional activity Standing balance-Leahy Scale: Good                   Standardized Balance Assessment Standardized Balance Assessment : Dynamic Gait Index   Dynamic Gait Index Level Surface: Normal Change in Gait Speed: Normal Gait with Horizontal Head Turns: Normal Gait with Vertical Head Turns: Normal Gait and Pivot Turn: Normal Step Over Obstacle: Mild Impairment Step Around Obstacles: Normal Steps: Normal Total Score: 23      Cognition Arousal/Alertness: Awake/alert Behavior During Therapy: WFL for tasks assessed/performed Overall Cognitive Status: Within Functional Limits for tasks assessed                                        Exercises      General Comments General comments (skin integrity, edema, etc.): Scored 23/24 on DGI sufggesting low risk of falls.       Pertinent Vitals/Pain Pain Assessment: No/denies pain    Home Living Family/patient expects to be discharged to:: Private residence Living Arrangements: Spouse/significant other;Children Available Help at Discharge: Family;Available 24 hours/day Type of Home: House Home Access: Stairs to enter   Home Layout: Two level;Able to live on main level with bedroom/bathroom Home Equipment: None      Prior  Function Level of Independence: Independent      Comments: completely independent, driving and doing for herself prior to admission   PT Goals (current goals can now be found in the care plan section) Acute Rehab PT Goals Patient Stated Goal: to get back out and work in her flowers PT Goal Formulation: All assessment and education complete, DC therapy Progress towards PT goals:  Progressing toward goals    Frequency           PT Plan Discharge plan needs to be updated    Co-evaluation              AM-PAC PT "6 Clicks" Daily Activity  Outcome Measure  Difficulty turning over in bed (including adjusting bedclothes, sheets and blankets)?: None Difficulty moving from lying on back to sitting on the side of the bed? : None Difficulty sitting down on and standing up from a chair with arms (e.g., wheelchair, bedside commode, etc,.)?: None Help needed moving to and from a bed to chair (including a wheelchair)?: None Help needed walking in hospital room?: None Help needed climbing 3-5 steps with a railing? : None 6 Click Score: 24    End of Session Equipment Utilized During Treatment: Gait belt Activity Tolerance: Patient tolerated treatment well Patient left: Other (comment) (with OT at sink) Nurse Communication: Mobility status PT Visit Diagnosis: Muscle weakness (generalized) (M62.81);Hemiplegia and hemiparesis Hemiplegia - Right/Left: Right Hemiplegia - dominant/non-dominant: Dominant Hemiplegia - caused by: Cerebral infarction     Time: 1011-1019 PT Time Calculation (min) (ACUTE ONLY): 8 min  Charges:  $Gait Training: 8-22 mins                    G Codes:       Kristinia Leavy,PT Acute Rehabilitation [phone] (pager)    Denice Paradise 10/17/2016, 1:30 PM

## 2016-10-17 NOTE — Evaluation (Signed)
Occupational Therapy Evaluation and Discharge Patient Details Name: Rose Greer MRN: [id number] DOB: 1955-12-15 Today's Date: 10/17/2016    History of Present Illness Pt 61 yo female admitted through ED with right sided weakness and facial droop. Pt reports she went to bed with a headache and woke up with weakness. Pt was found to have a L thalamus and L putaminal acute stroke via MRI. PMH significant for HTN, HLD, DM2, brain aneurysm, GERD.    Clinical Impression   Pt demonstrated independence in ADL and ADL transfers. No OT needs. Signing off.    Follow Up Recommendations  No OT follow up    Equipment Recommendations  None recommended by OT    Recommendations for Other Services       Precautions / Restrictions Precautions Precautions: None Restrictions Weight Bearing Restrictions: No      Mobility Bed Mobility               General bed mobility comments: pt seated EOB upon arrival  Transfers Overall transfer level: Independent Equipment used: None             General transfer comment: no assist from bed or toilet    Balance Overall balance assessment: Independent   Sitting balance-Leahy Scale: Good       Standing balance-Leahy Scale: Good                             ADL either performed or assessed with clinical judgement   ADL Overall ADL's : Independent                                             Vision Baseline Vision/History: No visual deficits       Perception     Praxis      Pertinent Vitals/Pain Pain Assessment: No/denies pain     Hand Dominance Right   Extremity/Trunk Assessment Upper Extremity Assessment Upper Extremity Assessment: Overall WFL for tasks assessed   Lower Extremity Assessment Lower Extremity Assessment: Defer to PT evaluation   Cervical / Trunk Assessment Cervical / Trunk Assessment: Normal   Communication Communication Communication: No difficulties   Cognition  Arousal/Alertness: Awake/alert Behavior During Therapy: WFL for tasks assessed/performed Overall Cognitive Status: Within Functional Limits for tasks assessed                                     General Comments       Exercises     Shoulder Instructions      Home Living Family/patient expects to be discharged to:: Private residence Living Arrangements: Spouse/significant other;Children Available Help at Discharge: Family;Available 24 hours/day Type of Home: House Home Access: Stairs to enter [employer] of Steps: 2   Home Layout: Two level;Able to live on main level with bedroom/bathroom     Bathroom Shower/Tub: Teacher, early years/pre: Standard     Home Equipment: None      Lives With: Family    Prior Functioning/Environment Level of Independence: Independent        Comments: completely independent, driving and doing for herself prior to admission        OT Problem List: Decreased coordination      OT Treatment/Interventions:      OT  Goals(Current goals can be found in the care plan section) Acute Rehab OT Goals Patient Stated Goal: to get back out and work in her flowers  OT Frequency:     Barriers to D/C:            Co-evaluation              AM-PAC PT "6 Clicks" Daily Activity     Outcome Measure Help from another person eating meals?: None Help from another person taking care of personal grooming?: None Help from another person toileting, which includes using toliet, bedpan, or urinal?: None Help from another person bathing (including washing, rinsing, drying)?: None Help from another person to put on and taking off regular upper body clothing?: None Help from another person to put on and taking off regular lower body clothing?: None 6 Click Score: 24   End of Session Equipment Utilized During Treatment: Gait belt Nurse Communication:  (elevated BP)  Activity Tolerance: Patient tolerated treatment  well Patient left: in chair;with call bell/phone within reach  OT Visit Diagnosis: Muscle weakness (generalized) (M62.81)                Time: 7614-7092 OT Time Calculation (min): 23 min Charges:  OT General Charges $OT Visit: 1 Procedure OT Evaluation $OT Eval Moderate Complexity: 1 Procedure G-Codes:      Malka So 10/17/2016, 10:39 AM  [phone]

## 2016-10-17 NOTE — Progress Notes (Signed)
Pt ambulated w/PT and participated in OT eval at bedside then BP 200/94.  NTG gtt increased back to 10 mcg/min.  Pt already received Hydralazine 50mg  PO previously.  Pt states she feels normal except for mild HA.  Sitting up in bedside chair at this time.  Will continue to monitor.

## 2016-10-17 NOTE — Progress Notes (Signed)
Thank you for consult on Ms. Rose Greer. Chart reviewed and note that she has met all therapy goals and is modified independent. Will defer CIR consult as no rehab needs noted at this time.

## 2016-10-18 ENCOUNTER — Encounter (HOSPITAL_COMMUNITY): Payer: Self-pay | Admitting: Radiology

## 2016-10-18 ENCOUNTER — Inpatient Hospital Stay (HOSPITAL_COMMUNITY): Payer: Medicare Other

## 2016-10-18 DIAGNOSIS — E876 Hypokalemia: Secondary | ICD-10-CM

## 2016-10-18 LAB — GLUCOSE, CAPILLARY
GLUCOSE-CAPILLARY: 158 mg/dL — AB (ref 65–99)
GLUCOSE-CAPILLARY: 203 mg/dL — AB (ref 65–99)
GLUCOSE-CAPILLARY: 248 mg/dL — AB (ref 65–99)
Glucose-Capillary: 228 mg/dL — ABNORMAL HIGH (ref 65–99)
Glucose-Capillary: 141 mg/dL — ABNORMAL HIGH (ref 65–99)

## 2016-10-18 LAB — BASIC METABOLIC PANEL
ANION GAP: 9 (ref 5–15)
BUN: 14 mg/dL (ref 6–20)
CHLORIDE: 105 mmol/L (ref 101–111)
CO2: 22 mmol/L (ref 22–32)
Calcium: 8.8 mg/dL — ABNORMAL LOW (ref 8.9–10.3)
Creatinine, Ser: 0.92 mg/dL (ref 0.44–1.00)
GFR calc Af Amer: >60 mL/min (ref >60)
GFR calc non Af Amer: >60 mL/min (ref >60)
Glucose, Bld: 246 mg/dL — ABNORMAL HIGH (ref 65–99)
POTASSIUM: 3.8 mmol/L (ref 3.5–5.1)
Sodium: 136 mmol/L (ref 135–145)

## 2016-10-18 LAB — HEMOGLOBIN A1C
HEMOGLOBIN A1C: 10.8 % — AB (ref 4.8–5.6)
Hgb A1c MFr Bld: 10.5 % — ABNORMAL HIGH (ref 4.8–5.6)
MEAN PLASMA GLUCOSE: 255 mg/dL
Mean Plasma Glucose: 263 mg/dL

## 2016-10-18 LAB — MAGNESIUM: Magnesium: 1.7 mg/dL (ref 1.7–2.4)

## 2016-10-18 MED ORDER — INSULIN DETEMIR 100 UNIT/ML ~~LOC~~ SOLN
20.0000 [IU] | Freq: Every day | SUBCUTANEOUS | Status: DC
  Administered 2016-10-19: 20 [IU] via SUBCUTANEOUS
  Filled 2016-10-18: qty 0.2

## 2016-10-18 MED ORDER — HYDRALAZINE HCL 50 MG PO TABS
75.0000 mg | ORAL_TABLET | Freq: Four times a day (QID) | ORAL | Status: DC
  Administered 2016-10-18 – 2016-10-19 (×4): 75 mg via ORAL
  Filled 2016-10-18: qty 1
  Filled 2016-10-18: qty 2
  Filled 2016-10-18 (×2): qty 1

## 2016-10-18 MED ORDER — IOPAMIDOL (ISOVUE-370) INJECTION 76%
INTRAVENOUS | Status: AC
  Administered 2016-10-18: 50 mL
  Filled 2016-10-18: qty 50

## 2016-10-18 MED ORDER — ATORVASTATIN CALCIUM 80 MG PO TABS
80.0000 mg | ORAL_TABLET | Freq: Every day | ORAL | Status: DC
  Administered 2016-10-18: 80 mg via ORAL
  Filled 2016-10-18: qty 1

## 2016-10-18 MED ORDER — HYDRALAZINE HCL 20 MG/ML IJ SOLN
5.0000 mg | Freq: Four times a day (QID) | INTRAMUSCULAR | Status: DC | PRN
  Administered 2016-10-18: 10 mg via INTRAVENOUS
  Filled 2016-10-18: qty 1

## 2016-10-18 MED ORDER — INSULIN ASPART 100 UNIT/ML ~~LOC~~ SOLN
8.0000 [IU] | Freq: Three times a day (TID) | SUBCUTANEOUS | Status: DC
  Administered 2016-10-18 – 2016-10-19 (×3): 8 [IU] via SUBCUTANEOUS

## 2016-10-18 NOTE — Progress Notes (Signed)
Clonidine patch found in bed. Patient sweaty. Temperature in room warm even though thermostat set at 68. Asked pharmacy to send a replacement patch. Will reapply patch. I assume that is why her BP has changed drastically. Will continue to monitor closely.

## 2016-10-18 NOTE — Progress Notes (Signed)
Report called to Quillian Quince, receiving RN on  5MW. VSS. Transferred to 0H40 via wheelchair with personal belongings.  [facility]

## 2016-10-18 NOTE — Progress Notes (Signed)
Inpatient Diabetes Program Recommendations  AACE/ADA: New Consensus Statement on Inpatient Glycemic Control (2015)  Target Ranges:  Prepandial:   less than 140 mg/dL      Peak postprandial:   less than 180 mg/dL (1-2 hours)      Critically ill patients:  140 - 180 mg/dL   Lab Results  Component Value Date   GLUCAP 158 (H) 10/18/2016   HGBA1C 10.5 (H) 10/16/2016    Review of Glycemic Control  Inpatient Diabetes Program Recommendations:   Spoke with pt @ bedside about A1C results 10.5 (255 average CBG over the past 2-3 months) with them and explained what an A1C is, basic pathophysiology of DM Type 2, basic home care, basic diabetes diet nutrition principles, importance of checking CBGs and maintaining good CBG control to prevent long-term and short-term complications. Reviewed signs and symptoms of hyperglycemia and hypoglycemia and how to treat hypoglycemia at home. Also reviewed blood sugar goals at home.  RNs to provide ongoing basic DM education at bedside with this patient. Reviewed with patient importance of taking medication including insulin as prescribed. Will follow.  Thank you, Nani Gasser. Jullianna Gabor, RN, MSN, CDE  Diabetes Coordinator Inpatient Glycemic Control Team Team Pager [phone] (8am-5pm) 10/18/2016 2:15 PM

## 2016-10-18 NOTE — Progress Notes (Signed)
PROGRESS NOTE    Rose Greer  EXH:[id number] DOB: 07/25/1955 DOA: 10/15/2016 PCP: [facility]   Brief Narrative:  61 y.o. BF PMHx Brain aneurysm, HTN, HLD, DM type II, Tobacco abuse  Presents She states she went to bed yesterday at around 11 PM and was feeling "normal" other than a pounding headache. When she woke up this morning she felt her right foot was numb and had difficulty standing. When she went to brush her teeth she was unable to grip the toothbrush in her right hand. Her significant other noticed slurred speech and a right facial droop at which time she was brought into the hospital for evaluation. Code stroke was not called given uncertain time of symptom initiation. She takes 81 mg of aspirin on a daily basis and has done so for many years. Upon arrival to the hospital she was noted to have a blood pressure of 240/120, labs that are essentially unremarkable, a tox screen that is only positive for marijuana, negative urinalysis, CT scan of the head is normal. Given persistence of her neurologic findings and marked hypertension admission is requested. Due to lack of MRI over the weekend and lack of neurology coverage over the next week at [facility], transfer to [facility] is requested.   Subjective: 7/17   A/O 4, negative CP, negative SOB, negative N/V, negative headache. Has spoken with diabetic coordinator today.     Assessment & Plan:   Principal Problem:   Right sided weakness Active Problems:   DM (diabetes mellitus) (HCC)   Chronic diastolic CHF (congestive heart failure) (HCC)   HTN (hypertension)   Hyperlipidemia   Tobacco abuse   Hypertensive emergency   Acute CVA with Right-sided weakness -MRI of the brain positive for acute stroke see results below -All deficits have resolved, therefore patient not candidate for rehabilitation. -Distal Left Common Carotid artery: can not rule out soft plaqe vs thrombus per carotid ultrasound.  Given that her stroke occurred on the left will obtain CTA neck and brain.  Chronic Systolic and Diastolic CHF -Strict in and out since admission -566ml -Daily weight Filed Weights   10/16/16 0433 10/17/16 0500 10/18/16 0420  Weight: 143 lb 15.4 oz (65.3 kg) 146 lb 2.6 oz (66.3 kg) 145 lb 11.6 oz (66.1 kg)  -Coreg 25 mg  BID -Catapres patch 0.2 mg q week -Lasix 40 mg daily -Hydralazine 50 mg QID -Imdur 30 mg daily -Lisinopril 10 mg BID  -Off NTG gtt. ~16 hours    Hypertensive emergency -Blood pressure at arrival was 240/120. -See CHF  Hyperlipidemia -Lipid panel not within ADA/AHA guidelines  -Increase Lipitor 80 mg daily   Diabetes type II uncontrolled with complications -4/6 Hemoglobin A1c= 9.0. -7/17 increased Levemir 20 units daily -7/17 increased NovoLog 8 units QAC -Moderate SSI.  Tobacco abuse -Counseled on cessation. -Refuses nicotine patch for now.  Hypokalemia -Potassium goal> 4    DVT prophylaxis: Lovenox Code Status: Full Family Communication: None Disposition Plan: If patient's BP remain stable overnight discharge on 7/18   Consultants:  None  Procedures/Significant Events:  7/14 CT head WO contrast: Negative for acute infarct 7/14 Echocardiogram: LVEF=:50%. Mild diffuse hypokinesis. -(grade 1 diastolic dysfunction).  - Pulmonary arteries:  PA peak pressure: 30 mm Hg (S). 7/15 MRI brain W contrast/MRA: MRI HEAD:-Acute LEFT thalamus 6 mm lacunar infarct.- Acute vs subacute LEFT putaminal 4 mm lacunar infarct. -Mild chronic small vessel ischemic disease. MRA HEAD: -3 mm LEFT PCOM aneurysm ; 7/16 Carotid Ultrasound bilateral:-The  vertebral arteries appear patent with antegrade flow. - Findings consistent with a 1- 15 percent stenosis involving Right Internal Carotid artery/Left Internal Carotid Artery.  -Distal Left Common Carotid artery: can not rule out soft plaqe vs thrombus.   VENTILATOR  SETTINGS: None   Cultures None  Antimicrobials: None   Devices None   LINES / TUBES:  None    Continuous Infusions: . nitroGLYCERIN Stopped (10/17/16 2000)     Objective: Vitals:   10/18/16 0420 10/18/16 0500 10/18/16 0600 10/18/16 0700  BP: 104/66 (!) 156/73 130/77 133/77  Pulse: 69 78 70 74  Resp: 13 (!) 23 20 13   Temp: 98.4 F (36.9 C)     TempSrc: Oral     SpO2: 97% 97% 91% 92%  Weight: 145 lb 11.6 oz (66.1 kg)     Height:        Intake/Output Summary (Last 24 hours) at 10/18/16 0742 Last data filed at 10/17/16 2000  Gross per 24 hour  Intake           878.38 ml  Output             1250 ml  Net          -371.62 ml   Filed Weights   10/16/16 0433 10/17/16 0500 10/18/16 0420  Weight: 143 lb 15.4 oz (65.3 kg) 146 lb 2.6 oz (66.3 kg) 145 lb 11.6 oz (66.1 kg)    Examination:  General: A/O 4, No acute respiratory distress Eyes: negative scleral hemorrhage, negative anisocoria, negative icterus ENT: Negative Runny nose, negative gingival bleeding, poor dentation Neck:  Negative scars, masses, torticollis, lymphadenopathy, JVD Lungs: Clear to auscultation bilaterally without wheezes or crackles Cardiovascular: Regular rate and rhythm without murmur gallop or rub normal S1 and S2 Abdomen: negative abdominal pain, nondistended, positive soft, bowel sounds, no rebound, no ascites, no appreciable mass Extremities: No significant cyanosis, clubbing, or edema bilateral lower extremities Skin: Negative rashes, lesions, ulcers Psychiatric:  Negative depression, negative anxiety, negative fatigue, negative mania  Central nervous system:  Cranial nerves II through XII intact, tongue/uvula midline, all extremities muscle strength 5/5, sensation intact throughout, finger nose finger bilateral within normal limits, quick finger touch bilateral within normal limits, heel to shin bilateral within normal limits, negative dysarthria, negative expressive aphasia, negative  receptive aphasia.   .     Data Reviewed: Care during the described time interval was provided by me .  I have reviewed this patient's available data, including medical history, events of note, physical examination, and all test results as part of my evaluation. I have personally reviewed and interpreted all radiology studies.  CBC:  Recent Labs Lab 10/15/16 0944 10/15/16 0951  WBC 8.8  --   NEUTROABS 4.5  --   HGB 15.4* 17.3*  HCT 46.6* 51.0*  MCV 86.9  --   PLT 201  --    Basic Metabolic Panel:  Recent Labs Lab 10/15/16 0944 10/15/16 0951 10/16/16 0748 10/17/16 0330 10/18/16 0215  NA 134* 138 135 138 136  K 3.9 4.0 4.2 3.4* 3.8  CL 99* 98* 105 108 105  CO2 24  --  20* 23 22  GLUCOSE 325* 324* 177* 182* 246*  BUN 7 5* 6 11 14   CREATININE 0.62 0.50 0.62 0.76 0.92  CALCIUM 9.3  --  8.8* 8.7* 8.8*  MG  --   --  1.8 1.8 1.7   GFR: Estimated Creatinine Clearance: 58 mL/min (by C-G formula based on SCr of 0.92 mg/dL). Liver Function  Tests:  Recent Labs Lab 10/15/16 0944  AST 34  ALT 20  ALKPHOS 91  BILITOT 0.7  PROT 8.4*  ALBUMIN 3.9   No results for input(s): LIPASE, AMYLASE in the last 168 hours. No results for input(s): AMMONIA in the last 168 hours. Coagulation Profile:  Recent Labs Lab 10/15/16 0944  INR 0.96   Cardiac Enzymes: No results for input(s): CKTOTAL, CKMB, CKMBINDEX, TROPONINI in the last 168 hours. BNP (last 3 results) No results for input(s): PROBNP in the last 8760 hours. HbA1C:  Recent Labs  10/16/16 0235  HGBA1C 10.5*   CBG:  Recent Labs Lab 10/16/16 2152 10/17/16 0811 10/17/16 1235 10/17/16 1719 10/17/16 2124  GLUCAP 186* 172* 142* 220* 188*   Lipid Profile:  Recent Labs  10/16/16 0235  CHOL 194  HDL 22*  LDLCALC 126*  TRIG 228*  CHOLHDL 8.8   Thyroid Function Tests: No results for input(s): TSH, T4TOTAL, FREET4, T3FREE, THYROIDAB in the last 72 hours. Anemia Panel: No results for input(s):  VITAMINB12, FOLATE, FERRITIN, TIBC, IRON, RETICCTPCT in the last 72 hours. Urine analysis:    Component Value Date/Time   COLORURINE STRAW (A) 10/15/2016 1105   APPEARANCEUR CLEAR 10/15/2016 1105   LABSPEC 1.009 10/15/2016 1105   PHURINE 8.0 10/15/2016 1105   GLUCOSEU >=500 (A) 10/15/2016 1105   HGBUR NEGATIVE 10/15/2016 1105   BILIRUBINUR NEGATIVE 10/15/2016 1105   KETONESUR NEGATIVE 10/15/2016 1105   PROTEINUR NEGATIVE 10/15/2016 1105   NITRITE NEGATIVE 10/15/2016 1105   LEUKOCYTESUR NEGATIVE 10/15/2016 1105   Sepsis Labs: @LABRCNTIP (procalcitonin:4,lacticidven:4)  ) Recent Results (from the past 240 hour(s))  MRSA PCR Screening     Status: None   Collection Time: 10/15/16  6:31 PM  Result Value Ref Range Status   MRSA by PCR NEGATIVE NEGATIVE Final    Comment:        The GeneXpert MRSA Assay (FDA approved for NASAL specimens only), is one component of a comprehensive MRSA colonization surveillance program. It is not intended to diagnose MRSA infection nor to guide or monitor treatment for MRSA infections.          Radiology Studies: No results found.      Scheduled Meds: . aspirin  300 mg Rectal Daily   Or  . aspirin  325 mg Oral Daily  . atorvastatin  40 mg Oral q1800  . carvedilol  25 mg Oral BID  . citalopram  40 mg Oral Daily  . cloNIDine  0.2 mg Transdermal Weekly  . enoxaparin (LOVENOX) injection  40 mg Subcutaneous Q24H  . furosemide  40 mg Oral Daily  . hydrALAZINE  50 mg Oral Q6H  . insulin aspart  0-15 Units Subcutaneous TID WC  . insulin aspart  0-5 Units Subcutaneous QHS  . insulin aspart  4 Units Subcutaneous TID WC  . insulin detemir  16 Units Subcutaneous Daily  . isosorbide mononitrate  30 mg Oral Daily  . lisinopril  10 mg Oral BID  . mirtazapine  45 mg Oral QHS  . pantoprazole  40 mg Oral Daily  . potassium chloride SA  20 mEq Oral Daily  . risperiDONE  2 mg Oral QHS   Continuous Infusions: . nitroGLYCERIN Stopped (10/17/16  2000)     LOS: 3 days    Time spent: 29 minutes   Phillp Dolores, Geraldo Docker, MD [facility] Pager [phone]   If 7PM-7AM, please contact night-coverage [email or web address] Password [facility] 10/18/2016, 7:42 AM

## 2016-10-18 NOTE — Plan of Care (Signed)
Problem: Bowel/Gastric: Goal: Will not experience complications related to bowel motility Outcome: Progressing Started senna

## 2016-10-19 DIAGNOSIS — I639 Cerebral infarction, unspecified: Principal | ICD-10-CM

## 2016-10-19 DIAGNOSIS — I5032 Chronic diastolic (congestive) heart failure: Secondary | ICD-10-CM

## 2016-10-19 DIAGNOSIS — Z720 Tobacco use: Secondary | ICD-10-CM

## 2016-10-19 DIAGNOSIS — I161 Hypertensive emergency: Secondary | ICD-10-CM

## 2016-10-19 DIAGNOSIS — I10 Essential (primary) hypertension: Secondary | ICD-10-CM

## 2016-10-19 LAB — BASIC METABOLIC PANEL
ANION GAP: 8 (ref 5–15)
BUN: 17 mg/dL (ref 6–20)
CALCIUM: 9.2 mg/dL (ref 8.9–10.3)
CO2: 24 mmol/L (ref 22–32)
Chloride: 104 mmol/L (ref 101–111)
Creatinine, Ser: 0.8 mg/dL (ref 0.44–1.00)
GFR calc non Af Amer: >60 mL/min (ref >60)
Glucose, Bld: 230 mg/dL — ABNORMAL HIGH (ref 65–99)
Potassium: 3.8 mmol/L (ref 3.5–5.1)
Sodium: 136 mmol/L (ref 135–145)

## 2016-10-19 LAB — GLUCOSE, CAPILLARY
GLUCOSE-CAPILLARY: 253 mg/dL — AB (ref 65–99)
GLUCOSE-CAPILLARY: 190 mg/dL — AB (ref 65–99)
Glucose-Capillary: 211 mg/dL — ABNORMAL HIGH (ref 65–99)

## 2016-10-19 LAB — MAGNESIUM: Magnesium: 1.8 mg/dL (ref 1.7–2.4)

## 2016-10-19 MED ORDER — INSULIN DETEMIR 100 UNIT/ML ~~LOC~~ SOLN: 20.0000 [IU] | Freq: Every day | SUBCUTANEOUS | 11 refills | Status: DC

## 2016-10-19 MED ORDER — HYDRALAZINE HCL 50 MG PO TABS: 75.0000 mg | ORAL_TABLET | Freq: Three times a day (TID) | ORAL | 2 refills | Status: DC

## 2016-10-19 MED ORDER — LISINOPRIL 10 MG PO TABS: 10.0000 mg | ORAL_TABLET | Freq: Every day | ORAL | 2 refills | Status: DC

## 2016-10-19 MED ORDER — PRAVASTATIN SODIUM 80 MG PO TABS: 80.0000 mg | ORAL_TABLET | Freq: Every day | ORAL | 2 refills | Status: DC

## 2016-10-19 MED ORDER — CLOPIDOGREL BISULFATE 75 MG PO TABS: 75.0000 mg | ORAL_TABLET | Freq: Every day | ORAL | 2 refills | Status: DC

## 2016-10-19 NOTE — Care Management Important Message (Signed)
Important Message  Patient Details  Name: Rose Greer MRN: [id number] Date of Birth: [birth year]   Medicare Important Message Given:  Yes    Rose Greer 10/19/2016, 11:28 AM

## 2016-10-20 NOTE — Discharge Summary (Signed)
Discharge Summary  Rose Greer YBO:[id number] DOB: [birth year]  PCP: [facility] date: 10/15/2016 Discharge date: 10/20/2016  Time spent: 35 minutes   Recommendations for Outpatient Follow-up:  1. New medication: Plavix 75 mg by mouth daily. 2. Medication change: Aspirin discontinued in the setting of starting Plavix. 3. Medication change: Apical increased from 40 mg to 80 mg by mouth daily 4. New Medication: Lisinopril 10 mg by mouth daily. 5. Medication change: Levemir increased from 16 units to 20 units daily at bedtime. 6. Medication change: Hydralazine increased from 50 mg 3 times a day take 75 mg by mouth 3 times a day 7. Patient follow-up with neurology stroke service in 1 month   Discharge Diagnoses:  Active Hospital Problems   Diagnosis Date Noted  . Right sided weakness 10/15/2016  . Hypertensive emergency 10/15/2016  . Chronic diastolic CHF (congestive heart failure) ([place]) 07/20/2016  . HTN (hypertension) 07/20/2016  . Hyperlipidemia 07/20/2016  . Tobacco abuse 07/20/2016  . DM (diabetes mellitus) ([place]) 07/07/2016    Resolved Hospital Problems   Diagnosis Date Noted Date Resolved  No resolved problems to display.    Discharge Condition: Improved, being discharged home   Diet recommendation: heart healthy carb modified   Vitals:   10/19/16 0257 10/19/16 0952  BP: 130/79 (!) 147/87  Pulse: 67 70  Resp: 20 16  Temp:  97.9 F (36.6 C)    History of present illness:  61 y.o.BF PMHx Brain aneurysm, HTN, HLD, DM type II, Tobacco abuse presented with complaints of a pounding headache starting on morning of 7/14.When she woke up this morning she felt her right foot was numb and had difficulty standing. When she went to brush her teeth she was unable to grip the toothbrush in her right hand. Her significant other noticed slurred speech and a right facial droop at which time she was brought into the hospital for evaluation. Code  stroke was not called given uncertain time of symptom initiation. She takes 81 mg of aspirin on a daily basis and has done so for many years. Upon arrival to the hospital she was noted to have a blood pressure of 240/120, labs that are essentially unremarkable, a tox screen that is only positive for marijuana, negative urinalysis, CT scan of the head is normal. 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 Stroke workup initiated. Acute infarct discovered with MRI. 2-D echo unrevealing. Carotid Doppler noted? Plaque formation at base on left side. Underwent CT angiogram which confirmed no evidence of significant stenoses. Felt to be small vessel disease. Patient with poorly controlled diabetes mellitus, hypertension and hyperlipidemia plus tobacco abuse. Counseled on tobacco abuse. See below for changes in diabetes hypertension and hyperlipidemia. Over next few days the patient's hospitalization, weakness resolved other still had some numbness on her right side. Evaluate by PT who signed off felt that she had no follow-up needs. Active Problems:   DM (diabetes mellitus) ([place]): A1c at 10.5. On Levemir 16 units, increased to 20 units plus sliding-scale.   Chronic diastolic CHF (congestive heart failure) ([place]): Noted diastolic dysfunction on echocardiogram. Lisinopril added to patient's [place] existing beta blocker. Euvolemic.   HTN (hypertension) with acute hypertensive urgency: Hypertensive urgency secondary CVA.  See some blood pressure control trying to keep systolic blood pressure below 200. Patient is better hypertensive control. Already on Imdur and beta blocker. On hydralazine 50, increased to 75 3 times a  day. Lisinopril added.   Hyperlipidemia: LDL 127. Target goal below 70. Pravachol increased from 40-80   Tobacco abuse: [place]. Receive nicotine patch during hospitalization   Hypertensive emergency   Procedures: 7/16: Carotid Doppler ultrasound:- The vertebral arteries appear patent with antegrade flow. - Findings consistent with a 1- 75 percent stenosis involving the   right internal carotid artery and the left internal carotid   artery. Low level echoes seen distal left common carotid artery,   can not rule out soft plaqe vs thrombus.Correlate with Ct angio if clinically necessary  7/14: 2-D echo: Mild focal basal hypertrophy. Ejection fraction 50%. Mild diffuse hypokinesis. Grade 1 diastolic dysfunction. Trivial tricuspid regurg.  Consultations:  Case discussed with neurology, appointment scheduled and will follow up with her in one month  Discharge Exam: BP (!) 147/87 (BP Location: Right Arm)   Pulse 70   Temp 97.9 F (36.6 C) (Oral)   Resp 16   Ht 5\' 3"  (1.6 m)   Wt 66.5 kg (146 lb 9.7 oz)   SpO2 99%   BMI 25.97 kg/m   General: Alert and oriented 3, no acute distress  Cardiovascular: Regular rate and rhythm, S1-S2  Respiratory: Clear to auscultation bilaterally   Discharge Instructions You were cared for by a hospitalist during your hospital stay. If you have any questions about your discharge medications or the care you received while you were in the hospital after you are discharged, you can call the unit and asked to speak with the hospitalist on call if the hospitalist that took care of you is not available. Once you are discharged, your primary care physician will handle any further medical issues. Please note that NO REFILLS for any discharge medications will be authorized once you are discharged, as it is imperative that you return to your primary care physician (or establish a relationship with a primary care physician if you do not have one) for your aftercare  needs so that they can reassess your need for medications and monitor your lab values.  Discharge Instructions    Diet - low sodium heart healthy    Complete by:  As directed    Increase activity slowly    Complete by:  As directed      Allergies as of 10/19/2016      Reactions   Benazepril Other (See Comments)   confusion   Penicillins Rash   Has patient had a PCN reaction causing immediate rash, facial/tongue/throat swelling, SOB or lightheadedness with hypotension: Yes Has patient had a PCN reaction causing severe rash involving mucus membranes or skin necrosis: No Has patient had a PCN reaction that required hospitalization No Has patient had a PCN reaction occurring within the last 10 years: No If all of the above answers are "NO", then may proceed with Cephalosporin use.      Medication List    STOP taking these medications   aspirin EC 81 MG tablet   LEVEMIR FLEXTOUCH 100 UNIT/ML Pen Generic drug:  Insulin Detemir Replaced by:  insulin detemir 100 UNIT/ML injection     TAKE these medications   carvedilol 25 MG tablet Commonly known as:  COREG Take 1 tablet (25 mg total) by mouth 2 (two) times daily.   citalopram 40 MG tablet Commonly known as:  CELEXA Take 40  mg by mouth daily.   clopidogrel 75 MG tablet Commonly known as:  PLAVIX Take 1 tablet (75 mg total) by mouth daily.   furosemide 40 MG tablet Commonly known as:  LASIX Take 1 tablet (40 mg total) by mouth daily.   glipiZIDE 5 MG tablet Commonly known as:  GLUCOTROL Take 1 tablet (5 mg total) by mouth 2 (two) times daily.   hydrALAZINE 50 MG tablet Commonly known as:  APRESOLINE Take 1.5 tablets (75 mg total) by mouth 3 (three) times daily. What changed:  how much to take  when to take this   insulin detemir 100 UNIT/ML injection Commonly known as:  LEVEMIR Inject 0.2 mLs (20 Units total) into the skin daily. Replaces:  LEVEMIR FLEXTOUCH 100 UNIT/ML Pen   isosorbide mononitrate 30 MG 24 hr  tablet Commonly known as:  IMDUR Take 1 tablet (30 mg total) by mouth daily.   lisinopril 10 MG tablet Commonly known as:  PRINIVIL,ZESTRIL Take 1 tablet (10 mg total) by mouth daily.   mirtazapine 45 MG tablet Commonly known as:  REMERON Take 45 mg by mouth at bedtime.   omeprazole 20 MG capsule Commonly known as:  PRILOSEC Take 20 mg by mouth daily.   potassium chloride SA 20 MEQ tablet Commonly known as:  K-DUR,KLOR-CON Take 1 tablet (20 mEq total) by mouth daily.   pravastatin 80 MG tablet Commonly known as:  PRAVACHOL Take 1 tablet (80 mg total) by mouth daily. What changed:  medication strength  how much to take   risperiDONE 2 MG tablet Commonly known as:  RISPERDAL Take 2 mg by mouth at bedtime.      Allergies  Allergen Reactions  . Benazepril Other (See Comments)    confusion  . Penicillins Rash    Has patient had a PCN reaction causing immediate rash, facial/tongue/throat swelling, SOB or lightheadedness with hypotension: Yes Has patient had a PCN reaction causing severe rash involving mucus membranes or skin necrosis: No Has patient had a PCN reaction that required hospitalization No Has patient had a PCN reaction occurring within the last 10 years: No If all of the above answers are "NO", then may proceed with Cephalosporin use.       The results of significant diagnostics from this hospitalization (including imaging, microbiology, ancillary and laboratory) are listed below for reference.    Significant Diagnostic Studies: Ct Angio Head W Or Wo Contrast  Result Date: 10/18/2016 CLINICAL DATA:  61 y/o F; further evaluation of stroke. No change initial symptoms. EXAM: CT ANGIOGRAPHY HEAD AND NECK TECHNIQUE: Multidetector CT imaging of the head and neck was performed using the standard protocol during bolus administration of intravenous contrast. Multiplanar CT image reconstructions and MIPs were obtained to evaluate the vascular anatomy. Carotid  stenosis measurements (when applicable) are obtained utilizing NASCET criteria, using the distal internal carotid diameter as the denominator. CONTRAST:  50 cc Isovue 370 COMPARISON:  10/16/2016 MRI of the head FINDINGS: CT HEAD FINDINGS Brain: Small lucencies are present within the left thalamus and lentiform nucleus corresponding to infarctions on MRI of the brain. No evidence for new large territory infarct, intracranial hemorrhage, or focal mass effect. Vascular: As below. Skull: Normal. Negative for fracture or focal lesion. Sinuses: Imaged portions are clear. Orbits: No acute finding. Review of the MIP images confirms the above findings CTA NECK FINDINGS Aortic arch: Bovine arch, normal variant. Imaged portion shows no evidence of aneurysm or dissection. No significant stenosis of the major arch vessel origins. Mild mixed  atherosclerotic plaque. Right carotid system: No evidence of dissection, stenosis (50% or greater) or occlusion. Left carotid system: No evidence of dissection, stenosis (50% or greater) or occlusion. Vertebral arteries: Left dominant. No evidence of dissection, stenosis (50% or greater) or occlusion. Skeleton: Mild cervical spine spondylosis with discogenic degenerative changes greatest at the C5 through C7 levels. Other neck: Negative. Upper chest: Negative. Review of the MIP images confirms the above findings CTA HEAD FINDINGS Anterior circulation: No significant stenosis, proximal occlusion, or vascular malformation. Mild non stenotic calcific plaque of carotid siphons. 2 mm posteriorly directed outpouching of left paraclinoid ICA upstream to PCOM origin without appreciable vessel, probably an aneurysm (series 13, image 79). Posterior circulation: No significant stenosis, proximal occlusion, aneurysm, or vascular malformation. Venous sinuses: As permitted by contrast timing, patent. Anatomic variants: Patent anterior bilateral posterior communicating arteries. Posterior communicating  arteries are larger than P1 segments compatible with persistent fetal circulation. Delayed phase: No abnormal intracranial enhancement. Review of the MIP images confirms the above findings IMPRESSION: 1. Small lucencies within left thalamus and left lentiform nucleus corresponding to infarcts on MRI of the brain. 2. No new acute intracranial abnormality identified. 3. Patent carotid and vertebral arteries of the neck. No dissection, aneurysm, or significant stenosis is identified. 4. Mild mixed atherosclerotic plaque of the aortic arch. 5. Patent circle of Willis. No large vessel occlusion or significant stenosis. 6. 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 Electronically Signed   By: Kristine Garbe M.D.   On: 10/18/2016 14:10   Ct Head Wo Contrast  Result Date: 10/15/2016 CLINICAL DATA:  Pt reports waking at 0730 this morning with right sided weakness and numbness. States she was last seen normal at 2300 last night. EXAM: CT HEAD WITHOUT CONTRAST TECHNIQUE: Contiguous axial images were obtained from the base of the skull through the vertex without intravenous contrast. COMPARISON:  None. FINDINGS: Brain: No evidence of acute infarction, hemorrhage, hydrocephalus, extra-axial collection or mass lesion/mass effect. Vascular: No hyperdense vessel or unexpected calcification. Skull: Normal. Negative for fracture or focal lesion. Sinuses/Orbits: Globes and orbits are unremarkable. Visualized sinuses and mastoid air cells are clear. Other: None. IMPRESSION: Normal unenhanced CT scan of the brain. Electronically Signed   By: Lajean Manes M.D.   On: 10/15/2016 10:17   Ct Angio Neck W Or Wo Contrast  Result Date: 10/18/2016 CLINICAL DATA:  61 y/o F; further evaluation of stroke. No change initial symptoms. EXAM: CT ANGIOGRAPHY HEAD AND NECK TECHNIQUE: Multidetector CT imaging of the head and neck was performed using the standard protocol  during bolus administration of intravenous contrast. Multiplanar CT image reconstructions and MIPs were obtained to evaluate the vascular anatomy. Carotid stenosis measurements (when applicable) are obtained utilizing NASCET criteria, using the distal internal carotid diameter as the denominator. CONTRAST:  50 cc Isovue 370 COMPARISON:  10/16/2016 MRI of the head FINDINGS: CT HEAD FINDINGS Brain: Small lucencies are present within the left thalamus and lentiform nucleus corresponding to infarctions on MRI of the brain. No evidence for new large territory infarct, intracranial hemorrhage, or focal mass effect. Vascular: As below. Skull: Normal. Negative for fracture or focal lesion. Sinuses: Imaged portions are clear. Orbits: No acute finding. Review of the MIP images confirms the above findings CTA NECK FINDINGS Aortic arch: Bovine arch, normal variant. Imaged portion shows no evidence of aneurysm or dissection. No significant stenosis of the major arch vessel origins. Mild mixed atherosclerotic plaque. Right carotid system: No evidence of  dissection, stenosis (50% or greater) or occlusion. Left carotid system: No evidence of dissection, stenosis (50% or greater) or occlusion. Vertebral arteries: Left dominant. No evidence of dissection, stenosis (50% or greater) or occlusion. Skeleton: Mild cervical spine spondylosis with discogenic degenerative changes greatest at the C5 through C7 levels. Other neck: Negative. Upper chest: Negative. Review of the MIP images confirms the above findings CTA HEAD FINDINGS Anterior circulation: No significant stenosis, proximal occlusion, or vascular malformation. Mild non stenotic calcific plaque of carotid siphons. 2 mm posteriorly directed outpouching of left paraclinoid ICA upstream to PCOM origin without appreciable vessel, probably an aneurysm (series 13, image 79). Posterior circulation: No significant stenosis, proximal occlusion, aneurysm, or vascular malformation. Venous  sinuses: As permitted by contrast timing, patent. Anatomic variants: Patent anterior bilateral posterior communicating arteries. Posterior communicating arteries are larger than P1 segments compatible with persistent fetal circulation. Delayed phase: No abnormal intracranial enhancement. Review of the MIP images confirms the above findings IMPRESSION: 1. Small lucencies within left thalamus and left lentiform nucleus corresponding to infarcts on MRI of the brain. 2. No new acute intracranial abnormality identified. 3. Patent carotid and vertebral arteries of the neck. No dissection, aneurysm, or significant stenosis is identified. 4. Mild mixed atherosclerotic plaque of the aortic arch. 5. Patent circle of Willis. No large vessel occlusion or significant stenosis. 6. 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 Electronically Signed   By: Kristine Garbe M.D.   On: 10/18/2016 14:10   Mr Brain Wo Contrast  Result Date: 10/16/2016 CLINICAL DATA:  Headache, slurred speech and RIGHT facial droop, follow-up stroke. History of brain aneurysm, hypertension, hyperlipidemia. EXAM: MRI HEAD WITHOUT CONTRAST MRA HEAD WITHOUT CONTRAST TECHNIQUE: Multiplanar, multiecho pulse sequences of the brain and surrounding structures were obtained without intravenous contrast. Angiographic images of the head were obtained using MRA technique without contrast. COMPARISON:  None. CT HEAD October 15, 2016 FINDINGS: MRI HEAD FINDINGS BRAIN: 6 mm reduced diffusion LEFT posterior thalamus with low ADC values. 4 mm reduced diffusion on LEFT putamen suspected with low ADC values on the coronal sequence. Associated T2 hyperintense signal within bilateral infarct. A few scattered subcentimeter supratentorial white matter FLAIR T2 hyperintensities. Ventricles and sulci are are normal for age. No midline shift, mass effect or masses. No susceptibility artifact to suggest  hemorrhage. No abnormal extra-axial fluid collections. VASCULAR: Normal major intracranial vascular flow voids present at skull base. SKULL AND UPPER CERVICAL SPINE: No abnormal sellar expansion. No suspicious calvarial bone marrow signal. Craniocervical junction maintained. SINUSES/ORBITS: Trace mastoid effusions. Trace paranasal sinus mucosal thickening. The included ocular globes and orbital contents are non-suspicious. OTHER: Patient is edentulous. MRA HEAD FINDINGS ANTERIOR CIRCULATION: Normal flow related enhancement of the included cervical, petrous, cavernous and supraclinoid internal carotid arteries. 3 mm posteriorly directed aneurysm LEFT supraclinoid segment, posteriorly directed though, difficult to characterize on maximum intensity projected reformations. Patent anterior communicating artery. Patent anterior and middle cerebral arteries, including distal segments. No large vessel occlusion, high-grade stenosis, aneurysm. POSTERIOR CIRCULATION: LEFT vertebral artery is dominant. Basilar artery is patent, with normal flow related enhancement of the main branch vessels. Patent posterior cerebral arteries. Robust bilateral posterior communicating artery is present. No large vessel occlusion, high-grade stenosis,  aneurysm. ANATOMIC VARIANTS: None. Source images and MIP images were reviewed. IMPRESSION: MRI HEAD: 1. Acute LEFT thalamus 6 mm lacunar infarct. 2. Acute versus subacute LEFT putaminal 4 mm lacunar infarct. 3. Mild chronic small vessel ischemic disease. MRA HEAD: 1. No  emergent large vessel occlusion or severe stenosis. 2. 3 mm LEFT PCOM aneurysm ; given patient's history of brain aneurysm, recommend correlation with prior imaging. Electronically Signed   By: Elon Alas M.D.   On: 10/16/2016 04:30   Mr Jodene Nam Head/brain LK Cm  Result Date: 10/16/2016 CLINICAL DATA:  Headache, slurred speech and RIGHT facial droop, follow-up stroke. History of brain aneurysm, hypertension, hyperlipidemia.  EXAM: MRI HEAD WITHOUT CONTRAST MRA HEAD WITHOUT CONTRAST TECHNIQUE: Multiplanar, multiecho pulse sequences of the brain and surrounding structures were obtained without intravenous contrast. Angiographic images of the head were obtained using MRA technique without contrast. COMPARISON:  None. CT HEAD October 15, 2016 FINDINGS: MRI HEAD FINDINGS BRAIN: 6 mm reduced diffusion LEFT posterior thalamus with low ADC values. 4 mm reduced diffusion on LEFT putamen suspected with low ADC values on the coronal sequence. Associated T2 hyperintense signal within bilateral infarct. A few scattered subcentimeter supratentorial white matter FLAIR T2 hyperintensities. Ventricles and sulci are are normal for age. No midline shift, mass effect or masses. No susceptibility artifact to suggest hemorrhage. No abnormal extra-axial fluid collections. VASCULAR: Normal major intracranial vascular flow voids present at skull base. SKULL AND UPPER CERVICAL SPINE: No abnormal sellar expansion. No suspicious calvarial bone marrow signal. Craniocervical junction maintained. SINUSES/ORBITS: Trace mastoid effusions. Trace paranasal sinus mucosal thickening. The included ocular globes and orbital contents are non-suspicious. OTHER: Patient is edentulous. MRA HEAD FINDINGS ANTERIOR CIRCULATION: Normal flow related enhancement of the included cervical, petrous, cavernous and supraclinoid internal carotid arteries. 3 mm posteriorly directed aneurysm LEFT supraclinoid segment, posteriorly directed though, difficult to characterize on maximum intensity projected reformations. Patent anterior communicating artery. Patent anterior and middle cerebral arteries, including distal segments. No large vessel occlusion, high-grade stenosis, aneurysm. POSTERIOR CIRCULATION: LEFT vertebral artery is dominant. Basilar artery is patent, with normal flow related enhancement of the main branch vessels. Patent posterior cerebral arteries. Robust bilateral posterior  communicating artery is present. No large vessel occlusion, high-grade stenosis,  aneurysm. ANATOMIC VARIANTS: None. Source images and MIP images were reviewed. IMPRESSION: MRI HEAD: 1. Acute LEFT thalamus 6 mm lacunar infarct. 2. Acute versus subacute LEFT putaminal 4 mm lacunar infarct. 3. Mild chronic small vessel ischemic disease. MRA HEAD: 1. No emergent large vessel occlusion or severe stenosis. 2. 3 mm LEFT PCOM aneurysm ; given patient's history of brain aneurysm, recommend correlation with prior imaging. Electronically Signed   By: Elon Alas M.D.   On: 10/16/2016 04:30    Microbiology: Recent Results (from the past 240 hour(s))  MRSA PCR Screening     Status: None   Collection Time: 10/15/16  6:31 PM  Result Value Ref Range Status   MRSA by PCR NEGATIVE NEGATIVE Final    Comment:        The GeneXpert MRSA Assay (FDA approved for NASAL specimens only), is one component of a comprehensive MRSA colonization surveillance program. It is not intended to diagnose MRSA infection nor to guide or monitor treatment for MRSA infections.      Labs: Basic Metabolic Panel:  Recent Labs Lab 10/15/16 0944 10/15/16 0951 10/16/16 0748 10/17/16 0330 10/18/16 0215 10/19/16 0553  NA 134* 138 135 138 136 136  K 3.9 4.0 4.2 3.4* 3.8 3.8  CL 99* 98* 105 108 105 104  CO2 24  --  20* 23 22 24   GLUCOSE 325* 324* 177* 182* 246* 230*  BUN 7 5* 6 11 14 17   CREATININE 0.62 0.50 0.62 0.76 0.92 0.80  CALCIUM 9.3  --  8.8* 8.7* 8.8* 9.2  MG  --   --  1.8 1.8 1.7 1.8   Liver Function Tests:  Recent Labs Lab 10/15/16 0944  AST 34  ALT 20  ALKPHOS 91  BILITOT 0.7  PROT 8.4*  ALBUMIN 3.9   No results for input(s): LIPASE, AMYLASE in the last 168 hours. No results for input(s): AMMONIA in the last 168 hours. CBC:  Recent Labs Lab 10/15/16 0944 10/15/16 0951  WBC 8.8  --   NEUTROABS 4.5  --   HGB 15.4* 17.3*  HCT 46.6* 51.0*  MCV 86.9  --   PLT 201  --    Cardiac  Enzymes: No results for input(s): CKTOTAL, CKMB, CKMBINDEX, TROPONINI in the last 168 hours. BNP: BNP (last 3 results)  Recent Labs  07/07/16 1736 09/01/16 1318  BNP 109.0* 57.0    ProBNP (last 3 results) No results for input(s): PROBNP in the last 8760 hours.  CBG:  Recent Labs Lab 10/18/16 2120 10/18/16 2330 10/19/16 0613 10/19/16 0739 10/19/16 1139  GLUCAP 248* 203* 253* 211* 190*       Signed:  Annita Brod, MD [facility] 10/20/2016, 10:06 AM

## 2016-11-03 ENCOUNTER — Ambulatory Visit (INDEPENDENT_AMBULATORY_CARE_PROVIDER_SITE_OTHER): Payer: Medicare Other | Admitting: Otolaryngology

## 2016-11-08 ENCOUNTER — Ambulatory Visit (HOSPITAL_COMMUNITY): Payer: Medicare Other

## 2016-11-23 ENCOUNTER — Ambulatory Visit (HOSPITAL_COMMUNITY): Payer: Medicare Other

## 2017-05-01 ENCOUNTER — Other Ambulatory Visit (HOSPITAL_COMMUNITY): Payer: Self-pay | Admitting: Internal Medicine

## 2017-05-01 ENCOUNTER — Encounter: Payer: Self-pay | Admitting: Gastroenterology

## 2017-05-01 DIAGNOSIS — B182 Chronic viral hepatitis C: Secondary | ICD-10-CM

## 2017-05-01 LAB — PROTIME-INR: INR: 1 (ref 0.9–1.1)

## 2017-05-05 ENCOUNTER — Ambulatory Visit (HOSPITAL_COMMUNITY)
Admission: RE | Admit: 2017-05-05 | Discharge: 2017-05-05 | Disposition: A | Discharge status: Home / Self Care | Payer: Medicare Other | Source: Ambulatory Visit | Attending: Internal Medicine | Admitting: Internal Medicine

## 2017-05-05 DIAGNOSIS — B182 Chronic viral hepatitis C: Secondary | ICD-10-CM | POA: Diagnosis present

## 2017-06-13 ENCOUNTER — Ambulatory Visit (INDEPENDENT_AMBULATORY_CARE_PROVIDER_SITE_OTHER): Payer: Medicare Other | Admitting: Gastroenterology

## 2017-06-13 ENCOUNTER — Other Ambulatory Visit: Payer: Self-pay

## 2017-06-13 ENCOUNTER — Encounter: Payer: Self-pay | Admitting: Gastroenterology

## 2017-06-13 VITALS — BP 120/71 | HR 75 | Temp 97.1°F | Ht 63.0 in | Wt 141.8 lb

## 2017-06-13 DIAGNOSIS — R1310 Dysphagia, unspecified: Secondary | ICD-10-CM

## 2017-06-13 DIAGNOSIS — Z800 Family history of malignant neoplasm of digestive organs: Secondary | ICD-10-CM | POA: Diagnosis not present

## 2017-06-13 MED ORDER — NA SULFATE-K SULFATE-MG SULF 17.5-3.13-1.6 GM/177ML PO SOLN: 1.0000 | ORAL | 0 refills | Status: DC

## 2017-06-13 MED ORDER — PANTOPRAZOLE SODIUM 40 MG PO TBEC: 40.0000 mg | DELAYED_RELEASE_TABLET | Freq: Every day | ORAL | 3 refills | Status: DC

## 2017-06-13 NOTE — Patient Instructions (Signed)
AB verbally advised for TCS/EGD/DIL w/RMR to be with Propofol.

## 2017-06-13 NOTE — Patient Instructions (Addendum)
Stop Prilosec. Start taking Protonix each morning, 30 minutes before breakfast. WHEN YOU START HEPATITIS C TREATMENT, MAKE SURE YOUR DOCTOR KNOWS THAT YOU ARE TAKING THIS, as some reflux medications can't be taken with hepatitis treatments.   We have scheduled you for a colonoscopy and an upper endoscopy with dilation by Dr. Gala Romney.   No glucotrol the morning of the procedure, no insulin the morning of the procedure.   It was a pleasure to see you today. I strive to create trusting relationships with patients to provide genuine, compassionate, and quality care. I value your feedback. If you receive a survey regarding your visit,  I greatly appreciate you taking time to fill this out.   Annitta Needs, PhD, ANP-BC [facility]

## 2017-06-13 NOTE — H&P (View-Only) (Signed)
Primary Care Physician:  Abran Richard, MD Primary Gastroenterologist:  Dr. Gala Romney   Chief Complaint  Patient presents with  . Colonoscopy    consult, no problems    HPI:   Rose Greer is a 62 y.o. female presenting today at the request of Dr. Yong Channel to arrange screening colonoscopy.   Has been seeing occasional low-volume hematochezia since October 2018 with BMs. Rare constipation. No abdominal pain. Occasional vague solid food dysphagia. Has to drink something to get it all the way down. Gets "hung up". +GERD. Prilosec 20 mg daily. Feels like it controls her reflux.   Last colonoscopy in remote past at [facility] per patient. She is unsure if she has have ever had an upper endoscopy. Father diagnosed with colon cancer at age 38.   States she was told she has a history of Hep C and waiting on medication from pharmacy. US abdomen with normal liver. Denies any blood transfusions, drug use.   Past Medical History:  Diagnosis Date  . Brain aneurysm   . CVA (cerebral vascular accident) ([place]) 10/2016   started on Plavix   . Essential hypertension    History of hypertensive urgency with pulmonary edema April 2018  . GERD (gastroesophageal reflux disease)   . Hyperlipidemia   . Type 2 diabetes mellitus ([place])     Past Surgical History:  Procedure Laterality Date  . ABDOMINAL HYSTERECTOMY    . BACK SURGERY      Current Outpatient Medications  Medication Sig Dispense Refill  . carvedilol (COREG) 25 MG tablet Take 1 tablet (25 mg total) by mouth 2 (two) times daily. 180 tablet 3  . citalopram (CELEXA) 40 MG tablet Take 40 mg by mouth daily.    . clopidogrel (PLAVIX) 75 MG tablet Take 1 tablet (75 mg total) by mouth daily. 30 tablet 2  . furosemide (LASIX) 40 MG tablet Take 1 tablet (40 mg total) by mouth daily. 30 tablet 0  . glipiZIDE (GLUCOTROL) 5 MG tablet Take 1 tablet (5 mg total) by mouth 2 (two) times daily. 60 tablet 0  . hydrALAZINE (APRESOLINE) 50 MG tablet Take 1.5  tablets (75 mg total) by mouth 3 (three) times daily. 135 tablet 2  . insulin detemir (LEVEMIR) 100 UNIT/ML injection Inject 0.2 mLs (20 Units total) into the skin daily. 10 mL 11  . isosorbide mononitrate (IMDUR) 30 MG 24 hr tablet Take 1 tablet (30 mg total) by mouth daily. 30 tablet 0  . lisinopril (PRINIVIL,ZESTRIL) 10 MG tablet Take 1 tablet (10 mg total) by mouth daily. 30 tablet 2  . mirtazapine (REMERON) 45 MG tablet Take 45 mg by mouth at bedtime.    Marland Kitchen omeprazole (PRILOSEC) 20 MG capsule Take 20 mg by mouth daily.    . potassium chloride SA (K-DUR,KLOR-CON) 20 MEQ tablet Take 1 tablet (20 mEq total) by mouth daily. 90 tablet 3  . pravastatin (PRAVACHOL) 80 MG tablet Take 1 tablet (80 mg total) by mouth daily. 30 tablet 2  . risperiDONE (RISPERDAL) 2 MG tablet Take 2 mg by mouth at bedtime.    . Na Sulfate-K Sulfate-Mg Sulf (SUPREP BOWEL PREP KIT) 17.5-3.13-1.6 GM/177ML SOLN Take 1 kit by mouth as directed. 1 Bottle 0  . pantoprazole (PROTONIX) 40 MG tablet Take 1 tablet (40 mg total) by mouth daily. Take 30 minutes before breakfast 90 tablet 3   No current facility-administered medications for this visit.     Allergies as of 06/13/2017 - Review Complete 06/13/2017  Allergen Reaction  Noted  . Benazepril Other (See Comments) 03/27/2015  . Penicillins Rash 03/27/2015    Family History  Problem Relation Age of Onset  . Diabetes Mother   . Hypertension Mother   . Colon cancer Father        diagnosed at age 43   . Lung cancer Father        thinks metastatic colon cancer  . Diabetes Sister        4 sisters ( hypertension )  . Hypertension Brother        4 brothers ( hypertension)     Social History   Socioeconomic History  . Marital status: Single    Spouse name: Not on file  . Number of children: Not on file  . Years of education: Not on file  . Highest education level: Not on file  Social Needs  . Financial resource strain: Not on file  . Food insecurity - worry: Not  on file  . Food insecurity - inability: Not on file  . Transportation needs - medical: Not on file  . Transportation needs - non-medical: Not on file  Occupational History  . Not on file  Tobacco Use  . Smoking status: Current Some Day Smoker    Packs/day: 0.25    Types: Cigarettes  . Smokeless tobacco: Never Used  Substance and Sexual Activity  . Alcohol use: No  . Drug use: No  . Sexual activity: Not on file  Other Topics Concern  . Not on file  Social History Narrative  . Not on file    Review of Systems: Gen: Denies any fever, chills, fatigue, weight loss, lack of appetite.  CV: Denies chest pain, heart palpitations, peripheral edema, syncope.  Resp: Denies shortness of breath at rest or with exertion. Denies wheezing or cough.  GI: see HPI  GU : Denies urinary burning, urinary frequency, urinary hesitancy MS: Denies joint pain, muscle weakness, cramps, or limitation of movement.  Derm: Denies rash, itching, dry skin Psych: Denies depression, anxiety, memory loss, and confusion Heme: see HPI   Physical Exam: BP 120/71   Pulse 75   Temp (!) 97.1 F (36.2 C) (Oral)   Ht 5' 3" (1.6 m)   Wt 141 lb 12.8 oz (64.3 kg)   BMI 25.12 kg/m  General:   Alert and oriented. Pleasant and cooperative. Well-nourished and well-developed.  Head:  Normocephalic and atraumatic. Eyes:  Without icterus, sclera clear and conjunctiva pink.  Ears:  Normal auditory acuity. Nose:  No deformity, discharge,  or lesions. Mouth:  No deformity or lesions, oral mucosa pink.  Lungs:  Clear to auscultation bilaterally. No wheezes, rales, or rhonchi. No distress.  Heart:  S1, S2 present without murmurs appreciated.  Abdomen:  +BS, soft, non-tender and non-distended. No HSM noted. No guarding or rebound. No masses appreciated.  Rectal:  Deferred  Msk:  Symmetrical without gross deformities. Normal posture. Extremities:  Without  edema. Neurologic:  Alert and  oriented x4 Psych:  Alert and  cooperative. Normal mood and affect.

## 2017-06-13 NOTE — Progress Notes (Signed)
Primary Care Physician:  Abran Richard, MD Primary Gastroenterologist:  Dr. Gala Romney   Chief Complaint  Patient presents with  . Colonoscopy    consult, no problems    HPI:   Rose Greer is a 62 y.o. female presenting today at the request of Dr. Yong Channel to arrange screening colonoscopy.   Has been seeing occasional low-volume hematochezia since October 2018 with BMs. Rare constipation. No abdominal pain. Occasional vague solid food dysphagia. Has to drink something to get it all the way down. Gets "hung up". +GERD. Prilosec 20 mg daily. Feels like it controls her reflux.   Last colonoscopy in remote past at [facility] per patient. She is unsure if she has have ever had an upper endoscopy. Father diagnosed with colon cancer at age 38.   States she was told she has a history of Hep C and waiting on medication from pharmacy. US abdomen with normal liver. Denies any blood transfusions, drug use.   Past Medical History:  Diagnosis Date  . Brain aneurysm   . CVA (cerebral vascular accident) ([place]) 10/2016   started on Plavix   . Essential hypertension    History of hypertensive urgency with pulmonary edema April 2018  . GERD (gastroesophageal reflux disease)   . Hyperlipidemia   . Type 2 diabetes mellitus ([place])     Past Surgical History:  Procedure Laterality Date  . ABDOMINAL HYSTERECTOMY    . BACK SURGERY      Current Outpatient Medications  Medication Sig Dispense Refill  . carvedilol (COREG) 25 MG tablet Take 1 tablet (25 mg total) by mouth 2 (two) times daily. 180 tablet 3  . citalopram (CELEXA) 40 MG tablet Take 40 mg by mouth daily.    . clopidogrel (PLAVIX) 75 MG tablet Take 1 tablet (75 mg total) by mouth daily. 30 tablet 2  . furosemide (LASIX) 40 MG tablet Take 1 tablet (40 mg total) by mouth daily. 30 tablet 0  . glipiZIDE (GLUCOTROL) 5 MG tablet Take 1 tablet (5 mg total) by mouth 2 (two) times daily. 60 tablet 0  . hydrALAZINE (APRESOLINE) 50 MG tablet Take 1.5  tablets (75 mg total) by mouth 3 (three) times daily. 135 tablet 2  . insulin detemir (LEVEMIR) 100 UNIT/ML injection Inject 0.2 mLs (20 Units total) into the skin daily. 10 mL 11  . isosorbide mononitrate (IMDUR) 30 MG 24 hr tablet Take 1 tablet (30 mg total) by mouth daily. 30 tablet 0  . lisinopril (PRINIVIL,ZESTRIL) 10 MG tablet Take 1 tablet (10 mg total) by mouth daily. 30 tablet 2  . mirtazapine (REMERON) 45 MG tablet Take 45 mg by mouth at bedtime.    Marland Kitchen omeprazole (PRILOSEC) 20 MG capsule Take 20 mg by mouth daily.    . potassium chloride SA (K-DUR,KLOR-CON) 20 MEQ tablet Take 1 tablet (20 mEq total) by mouth daily. 90 tablet 3  . pravastatin (PRAVACHOL) 80 MG tablet Take 1 tablet (80 mg total) by mouth daily. 30 tablet 2  . risperiDONE (RISPERDAL) 2 MG tablet Take 2 mg by mouth at bedtime.    . Na Sulfate-K Sulfate-Mg Sulf (SUPREP BOWEL PREP KIT) 17.5-3.13-1.6 GM/177ML SOLN Take 1 kit by mouth as directed. 1 Bottle 0  . pantoprazole (PROTONIX) 40 MG tablet Take 1 tablet (40 mg total) by mouth daily. Take 30 minutes before breakfast 90 tablet 3   No current facility-administered medications for this visit.     Allergies as of 06/13/2017 - Review Complete 06/13/2017  Allergen Reaction  Noted  . Benazepril Other (See Comments) 03/27/2015  . Penicillins Rash 03/27/2015    Family History  Problem Relation Age of Onset  . Diabetes Mother   . Hypertension Mother   . Colon cancer Father        diagnosed at age 55   . Lung cancer Father        thinks metastatic colon cancer  . Diabetes Sister        4 sisters ( hypertension )  . Hypertension Brother        4 brothers ( hypertension)     Social History   Socioeconomic History  . Marital status: Single    Spouse name: Not on file  . Number of children: Not on file  . Years of education: Not on file  . Highest education level: Not on file  Social Needs  . Financial resource strain: Not on file  . Food insecurity - worry: Not  on file  . Food insecurity - inability: Not on file  . Transportation needs - medical: Not on file  . Transportation needs - non-medical: Not on file  Occupational History  . Not on file  Tobacco Use  . Smoking status: Current Some Day Smoker    Packs/day: 0.25    Types: Cigarettes  . Smokeless tobacco: Never Used  Substance and Sexual Activity  . Alcohol use: No  . Drug use: No  . Sexual activity: Not on file  Other Topics Concern  . Not on file  Social History Narrative  . Not on file    Review of Systems: Gen: Denies any fever, chills, fatigue, weight loss, lack of appetite.  CV: Denies chest pain, heart palpitations, peripheral edema, syncope.  Resp: Denies shortness of breath at rest or with exertion. Denies wheezing or cough.  GI: see HPI  GU : Denies urinary burning, urinary frequency, urinary hesitancy MS: Denies joint pain, muscle weakness, cramps, or limitation of movement.  Derm: Denies rash, itching, dry skin Psych: Denies depression, anxiety, memory loss, and confusion Heme: see HPI   Physical Exam: BP 120/71   Pulse 75   Temp (!) 97.1 F (36.2 C) (Oral)   Ht 5' 3" (1.6 m)   Wt 141 lb 12.8 oz (64.3 kg)   BMI 25.12 kg/m  General:   Alert and oriented. Pleasant and cooperative. Well-nourished and well-developed.  Head:  Normocephalic and atraumatic. Eyes:  Without icterus, sclera clear and conjunctiva pink.  Ears:  Normal auditory acuity. Nose:  No deformity, discharge,  or lesions. Mouth:  No deformity or lesions, oral mucosa pink.  Lungs:  Clear to auscultation bilaterally. No wheezes, rales, or rhonchi. No distress.  Heart:  S1, S2 present without murmurs appreciated.  Abdomen:  +BS, soft, non-tender and non-distended. No HSM noted. No guarding or rebound. No masses appreciated.  Rectal:  Deferred  Msk:  Symmetrical without gross deformities. Normal posture. Extremities:  Without  edema. Neurologic:  Alert and  oriented x4 Psych:  Alert and  cooperative. Normal mood and affect.

## 2017-06-14 ENCOUNTER — Telehealth: Payer: Self-pay

## 2017-06-14 NOTE — Telephone Encounter (Signed)
Called and informed pt of pre-op appt 07/07/17 at 11:00am. Letter mailed.

## 2017-06-16 NOTE — Assessment & Plan Note (Signed)
62 year old female with need for colonoscopy, last completed in remote past in [place] per patient. Positive family history of colon cancer in father, diagnosed at age 53. Low-volume rectal bleeding since Oct 2018 noted.   Proceed with TCS with Dr. Gala Romney in near future: the risks, benefits, and alternatives have been discussed with the patient in detail. The patient states understanding and desires to proceed. PROPOFOL due to polypharmacy

## 2017-06-16 NOTE — Assessment & Plan Note (Signed)
Intermittent solid food dysphagia in setting of chronic GERD. Taking Prilosec 20 mg daily. No prior EGD that she can recall. Will pursue EGD with dilation at time of procedure.   Proceed with upper endoscopy/dilation in the near future with Dr. Gala Romney. The risks, benefits, and alternatives have been discussed in detail with patient. They have stated understanding and desire to proceed.  PROPOFOL due to polypharmacy Change from Prilosec to Protonix due to interaction of Prilosec with Plavix.  As of note, she reports that she will be undergoing Hep C treatment through PCP in near future. US abdomen with normal liver on file. Requesting labs for our records. I also asked patient to review med list with PCP as PPI use and Hep C treatment will need to be adjusted.

## 2017-06-19 NOTE — Progress Notes (Signed)
CC'ED TO PCP 

## 2017-07-04 NOTE — Patient Instructions (Signed)
Rose Greer  07/04/2017     @PREFPERIOPPHARMACY @   Your procedure is scheduled on  07/13/2017 .  Report to [facility] at  1115  A.M.  Call this number if you have problems the morning of surgery:  [phone]   Remember:  Do not eat food or drink liquids after midnight.  Take these medicines the morning of surgery with A SIP OF WATER  Coreg, celexa, imdur, lisinopril, protonix.   Do not wear jewelry, make-up or nail polish.  Do not wear lotions, powders, or perfumes, or deodorant.  Do not shave 48 hours prior to surgery.  Men may shave face and neck.  Do not bring valuables to the hospital.  [facility] is not responsible for any belongings or valuables.  Contacts, dentures or bridgework may not be worn into surgery.  Leave your suitcase in the car.  After surgery it may be brought to your room.  For patients admitted to the hospital, discharge time will be determined by your treatment team.  Patients discharged the day of surgery will not be allowed to drive home.   Name and phone number of your driver:   family Special instructions:  Follow the diet and prep instructions given to you by Dr Roseanne Kaufman office.  Please read over the following fact sheets that you were given. Anesthesia Post-op Instructions and Care and Recovery After Surgery       Esophagogastroduodenoscopy Esophagogastroduodenoscopy (EGD) is a procedure to examine the lining of the esophagus, stomach, and first part of the small intestine (duodenum). This procedure is done to check for problems such as inflammation, bleeding, ulcers, or growths. During this procedure, a long, flexible, lighted tube with a camera attached (endoscope) is inserted down the throat. Tell a health care provider about:  Any allergies you have.  All medicines you are taking, including vitamins, herbs, eye drops, creams, and over-the-counter medicines.  Any problems you or family members have had with  anesthetic medicines.  Any blood disorders you have.  Any surgeries you have had.  Any medical conditions you have.  Whether you are pregnant or may be pregnant. What are the risks? Generally, this is a safe procedure. However, problems may occur, including:  Infection.  Bleeding.  A tear (perforation) in the esophagus, stomach, or duodenum.  Trouble breathing.  Excessive sweating.  Spasms of the larynx.  A slowed heartbeat.  Low blood pressure.  What happens before the procedure?  Follow instructions from your health care provider about eating or drinking restrictions.  Ask your health care provider about: ? Changing or stopping your regular medicines. This is especially important if you are taking diabetes medicines or blood thinners. ? Taking medicines such as aspirin and ibuprofen. These medicines can thin your blood. Do not take these medicines before your procedure if your health care provider instructs you not to.  Plan to have someone take you home after the procedure.  If you wear dentures, be ready to remove them before the procedure. What happens during the procedure?  To reduce your risk of infection, your health care team will wash or sanitize their hands.  An IV tube will be put in a vein in your hand or arm. You will get medicines and fluids through this tube.  You will be given one or more of the following: ? A medicine to help you relax (sedative). ? A medicine to numb the  area (local anesthetic). This medicine may be sprayed into your throat. It will make you feel more comfortable and keep you from gagging or coughing during the procedure. ? A medicine for pain.  A mouth guard may be placed in your mouth to protect your teeth and to keep you from biting on the endoscope.  You will be asked to lie on your left side.  The endoscope will be lowered down your throat into your esophagus, stomach, and duodenum.  Air will be put into the endoscope.  This will help your health care provider see better.  The lining of your esophagus, stomach, and duodenum will be examined.  Your health care provider may: ? Take a tissue sample so it can be looked at in a lab (biopsy). ? Remove growths. ? Remove objects (foreign bodies) that are stuck. ? Treat any bleeding with medicines or other devices that stop tissue from bleeding. ? Widen (dilate) or stretch narrowed areas of your esophagus and stomach.  The endoscope will be taken out. The procedure may vary among health care providers and hospitals. What happens after the procedure?  Your blood pressure, heart rate, breathing rate, and blood oxygen level will be monitored often until the medicines you were given have worn off.  Do not eat or drink anything until the numbing medicine has worn off and your gag reflex has returned. This information is not intended to replace advice given to you by your health care provider. Make sure you discuss any questions you have with your health care provider. Document Released: 07/22/2004 Document Revised: 08/27/2015 Document Reviewed: 02/12/2015 Elsevier Interactive Patient Education  2018 [employer]. Esophagogastroduodenoscopy, Care After Refer to this sheet in the next few weeks. These instructions provide you with information about caring for yourself after your procedure. Your health care provider may also give you more specific instructions. Your treatment has been planned according to current medical practices, but problems sometimes occur. Call your health care provider if you have any problems or questions after your procedure. What can I expect after the procedure? After the procedure, it is common to have:  A sore throat.  Nausea.  Bloating.  Dizziness.  Fatigue.  Follow these instructions at home:  Do not eat or drink anything until the numbing medicine (local anesthetic) has worn off and your gag reflex has returned. You will know  that the local anesthetic has worn off when you can swallow comfortably.  Do not drive for 24 hours if you received a medicine to help you relax (sedative).  If your health care provider took a tissue sample for testing during the procedure, make sure to get your test results. This is your responsibility. Ask your health care provider or the department performing the test when your results will be ready.  Keep all follow-up visits as told by your health care provider. This is important. Contact a health care provider if:  You cannot stop coughing.  You are not urinating.  You are urinating less than usual. Get help right away if:  You have trouble swallowing.  You cannot eat or drink.  You have throat or chest pain that gets worse.  You are dizzy or light-headed.  You faint.  You have nausea or vomiting.  You have chills.  You have a fever.  You have severe abdominal pain.  You have black, tarry, or bloody stools. This information is not intended to replace advice given to you by your health care provider. Make sure you  discuss any questions you have with your health care provider. Document Released: 03/07/2012 Document Revised: 08/27/2015 Document Reviewed: 02/12/2015 Elsevier Interactive Patient Education  2018 [employer].  Esophageal Dilatation Esophageal dilatation is a procedure to open a blocked or narrowed part of the esophagus. The esophagus is the long tube in your throat that carries food and liquid from your mouth to your stomach. The procedure is also called esophageal dilation. You may need this procedure if you have a buildup of scar tissue in your esophagus that makes it difficult, painful, or even impossible to swallow. This can be caused by gastroesophageal reflux disease (GERD). In rare cases, people need this procedure because they have cancer of the esophagus or a problem with the way food moves through the esophagus. Sometimes you may need to have  another dilatation to enlarge the opening of the esophagus gradually. Tell a health care provider about:  Any allergies you have.  All medicines you are taking, including vitamins, herbs, eye drops, creams, and over-the-counter medicines.  Any problems you or family members have had with anesthetic medicines.  Any blood disorders you have.  Any surgeries you have had.  Any medical conditions you have.  Any antibiotic medicines you are required to take before dental procedures. What are the risks? Generally, this is a safe procedure. However, problems can occur and include:  Bleeding from a tear in the lining of the esophagus.  A hole (perforation) in the esophagus.  What happens before the procedure?  Do not eat or drink anything after midnight on the night before the procedure or as directed by your health care provider.  Ask your health care provider about changing or stopping your regular medicines. This is especially important if you are taking diabetes medicines or blood thinners.  Plan to have someone take you home after the procedure. What happens during the procedure?  You will be given a medicine that makes you relaxed and sleepy (sedative).  A medicine may be sprayed or gargled to numb the back of the throat.  Your health care provider can use various instruments to do an esophageal dilatation. During the procedure, the instrument used will be placed in your mouth and passed down into your esophagus. Options include: ? Simple dilators. This instrument is carefully placed in the esophagus to stretch it. ? Guided wire bougies. In this method, a flexible tube (endoscope) is used to insert a wire into the esophagus. The dilator is passed over this wire to enlarge the esophagus. Then the wire is removed. ? Balloon dilators. An endoscope with a small balloon at the end is passed down into the esophagus. Inflating the balloon gently stretches the esophagus and opens it  up. What happens after the procedure?  Your blood pressure, heart rate, breathing rate, and blood oxygen level will be monitored often until the medicines you were given have worn off.  Your throat may feel slightly sore and will probably still feel numb. This will improve slowly over time.  You will not be allowed to eat or drink until the throat numbness has resolved.  If this is a same-day procedure, you may be allowed to go home once you have been able to drink, urinate, and sit on the edge of the bed without nausea or dizziness.  If this is a same-day procedure, you should have a friend or family member with you for the next 24 hours after the procedure. This information is not intended to replace advice given to you  by your health care provider. Make sure you discuss any questions you have with your health care provider. Document Released: 05/12/2005 Document Revised: 08/27/2015 Document Reviewed: 07/31/2013 Elsevier Interactive Patient Education  [employer].  Colonoscopy, Adult A colonoscopy is an exam to look at the large intestine. It is done to check for problems, such as:  Lumps (tumors).  Growths (polyps).  Swelling (inflammation).  Bleeding.  What happens before the procedure? Eating and drinking Follow instructions from your doctor about eating and drinking. These instructions may include:  A few days before the procedure - follow a low-fiber diet. ? Avoid nuts. ? Avoid seeds. ? Avoid dried fruit. ? Avoid raw fruits. ? Avoid vegetables.  1-3 days before the procedure - follow a clear liquid diet. Avoid liquids that have red or purple dye. Drink only clear liquids, such as: ? Clear broth or bouillon. ? Black coffee or tea. ? Clear juice. ? Clear soft drinks or sports drinks. ? Gelatin dessert. ? Popsicles.  On the day of the procedure - do not eat or drink anything during the 2 hours before the procedure.  Bowel prep If you were prescribed an oral  bowel prep:  Take it as told by your doctor. Starting the day before your procedure, you will need to drink a lot of liquid. The liquid will cause you to poop (have bowel movements) until your poop is almost clear or light green.  If your skin or butt gets irritated from diarrhea, you may: ? Wipe the area with wipes that have medicine in them, such as adult wet wipes with aloe and vitamin E. ? Put something on your skin that soothes the area, such as petroleum jelly.  If you throw up (vomit) while drinking the bowel prep, take a break for up to 60 minutes. Then begin the bowel prep again. If you keep throwing up and you cannot take the bowel prep without throwing up, call your doctor.  General instructions  Ask your doctor about changing or stopping your normal medicines. This is important if you take diabetes medicines or blood thinners.  Plan to have someone take you home from the hospital or clinic. What happens during the procedure?  An IV tube may be put into one of your veins.  You will be given medicine to help you relax (sedative).  To reduce your risk of infection: ? Your doctors will wash their hands. ? Your anal area will be washed with soap.  You will be asked to lie on your side with your knees bent.  Your doctor will get a long, thin, flexible tube ready. The tube will have a camera and a light on the end.  The tube will be put into your anus.  The tube will be gently put into your large intestine.  Air will be delivered into your large intestine to keep it open. You may feel some pressure or cramping.  The camera will be used to take photos.  A small tissue sample may be removed from your body to be looked at under a microscope (biopsy). If any possible problems are found, the tissue will be sent to a lab for testing.  If small growths are found, your doctor may remove them and have them checked for cancer.  The tube that was put into your anus will be slowly  removed. The procedure may vary among doctors and hospitals. What happens after the procedure?  Your doctor will check on you often until the  medicines you were given have worn off.  Do not drive for 24 hours after the procedure.  You may have a small amount of blood in your poop.  You may pass gas.  You may have mild cramps or bloating in your belly (abdomen).  It is up to you to get the results of your procedure. Ask your doctor, or the department performing the procedure, when your results will be ready. This information is not intended to replace advice given to you by your health care provider. Make sure you discuss any questions you have with your health care provider. Document Released: 04/23/2010 Document Revised: 01/20/2016 Document Reviewed: 06/02/2015 Elsevier Interactive Patient Education  2017 [employer]  Colonoscopy, Adult, Care After This sheet gives you information about how to care for yourself after your procedure. Your health care provider may also give you more specific instructions. If you have problems or questions, contact your health care provider. What can I expect after the procedure? After the procedure, it is common to have:  A small amount of blood in your stool for 24 hours after the procedure.  Some gas.  Mild abdominal cramping or bloating.  Follow these instructions at home: General instructions   For the first 24 hours after the procedure: ? Do not drive or use machinery. ? Do not sign important documents. ? Do not drink alcohol. ? Do your regular daily activities at a slower pace than normal. ? Eat soft, easy-to-digest foods. ? Rest often.  Take over-the-counter or prescription medicines only as told by your health care provider.  It is up to you to get the results of your procedure. Ask your health care provider, or the department performing the procedure, when your results will be ready. Relieving cramping and bloating  Try  walking around when you have cramps or feel bloated.  Apply heat to your abdomen as told by your health care provider. Use a heat source that your health care provider recommends, such as a moist heat pack or a heating pad. ? Place a towel between your skin and the heat source. ? Leave the heat on for 20-30 minutes. ? Remove the heat if your skin turns bright red. This is especially important if you are unable to feel pain, heat, or cold. You may have a greater risk of getting burned. Eating and drinking  Drink enough fluid to keep your urine clear or pale yellow.  Resume your normal diet as instructed by your health care provider. Avoid heavy or fried foods that are hard to digest.  Avoid drinking alcohol for as long as instructed by your health care provider. Contact a health care provider if:  You have blood in your stool 2-3 days after the procedure. Get help right away if:  You have more than a small spotting of blood in your stool.  You pass large blood clots in your stool.  Your abdomen is swollen.  You have nausea or vomiting.  You have a fever.  You have increasing abdominal pain that is not relieved with medicine. This information is not intended to replace advice given to you by your health care provider. Make sure you discuss any questions you have with your health care provider. Document Released: 11/03/2003 Document Revised: 12/14/2015 Document Reviewed: 06/02/2015 Elsevier Interactive Patient Education  2018 [employer] Anesthesia is a term that refers to techniques, procedures, and medicines that help a person stay safe and comfortable during a medical procedure. Monitored anesthesia  care, or sedation, is one type of anesthesia. Your anesthesia specialist may recommend sedation if you will be having a procedure that does not require you to be unconscious, such as:  Cataract surgery.  A dental procedure.  A biopsy.  A  colonoscopy.  During the procedure, you may receive a medicine to help you relax (sedative). There are three levels of sedation:  Mild sedation. At this level, you may feel awake and relaxed. You will be able to follow directions.  Moderate sedation. At this level, you will be sleepy. You may not remember the procedure.  Deep sedation. At this level, you will be asleep. You will not remember the procedure.  The more medicine you are given, the deeper your level of sedation will be. Depending on how you respond to the procedure, the anesthesia specialist may change your level of sedation or the type of anesthesia to fit your needs. An anesthesia specialist will monitor you closely during the procedure. Let your health care provider know about:  Any allergies you have.  All medicines you are taking, including vitamins, herbs, eye drops, creams, and over-the-counter medicines.  Any use of steroids (by mouth or as a cream).  Any problems you or family members have had with sedatives and anesthetic medicines.  Any blood disorders you have.  Any surgeries you have had.  Any medical conditions you have, such as sleep apnea.  Whether you are pregnant or may be pregnant.  Any use of cigarettes, alcohol, or street drugs. What are the risks? Generally, this is a safe procedure. However, problems may occur, including:  Getting too much medicine (oversedation).  Nausea.  Allergic reaction to medicines.  Trouble breathing. If this happens, a breathing tube may be used to help with breathing. It will be removed when you are awake and breathing on your own.  Heart trouble.  Lung trouble.  Before the procedure Staying hydrated Follow instructions from your health care provider about hydration, which may include:  Up to 2 hours before the procedure - you may continue to drink clear liquids, such as water, clear fruit juice, black coffee, and plain tea.  Eating and drinking  restrictions Follow instructions from your health care provider about eating and drinking, which may include:  8 hours before the procedure - stop eating heavy meals or foods such as meat, fried foods, or fatty foods.  6 hours before the procedure - stop eating light meals or foods, such as toast or cereal.  6 hours before the procedure - stop drinking milk or drinks that contain milk.  2 hours before the procedure - stop drinking clear liquids.  Medicines Ask your health care provider about:  Changing or stopping your regular medicines. This is especially important if you are taking diabetes medicines or blood thinners.  Taking medicines such as aspirin and ibuprofen. These medicines can thin your blood. Do not take these medicines before your procedure if your health care provider instructs you not to.  Tests and exams  You will have a physical exam.  You may have blood tests done to show: ? How well your kidneys and liver are working. ? How well your blood can clot.  General instructions  Plan to have someone take you home from the hospital or clinic.  If you will be going home right after the procedure, plan to have someone with you for 24 hours.  What happens during the procedure?  Your blood pressure, heart rate, breathing, level of pain and  overall condition will be monitored.  An IV tube will be inserted into one of your veins.  Your anesthesia specialist will give you medicines as needed to keep you comfortable during the procedure. This may mean changing the level of sedation.  The procedure will be performed. After the procedure  Your blood pressure, heart rate, breathing rate, and blood oxygen level will be monitored until the medicines you were given have worn off.  Do not drive for 24 hours if you received a sedative.  You may: ? Feel sleepy, clumsy, or nauseous. ? Feel forgetful about what happened after the procedure. ? Have a sore throat if you had a  breathing tube during the procedure. ? Vomit. This information is not intended to replace advice given to you by your health care provider. Make sure you discuss any questions you have with your health care provider. Document Released: 12/15/2004 Document Revised: 08/28/2015 Document Reviewed: 07/12/2015 Elsevier Interactive Patient Education  2018 [place], Care After These instructions provide you with information about caring for yourself after your procedure. Your health care provider may also give you more specific instructions. Your treatment has been planned according to current medical practices, but problems sometimes occur. Call your health care provider if you have any problems or questions after your procedure. What can I expect after the procedure? After your procedure, it is common to:  Feel sleepy for several hours.  Feel clumsy and have poor balance for several hours.  Feel forgetful about what happened after the procedure.  Have poor judgment for several hours.  Feel nauseous or vomit.  Have a sore throat if you had a breathing tube during the procedure.  Follow these instructions at home: For at least 24 hours after the procedure:   Do not: ? Participate in activities in which you could fall or become injured. ? Drive. ? Use heavy machinery. ? Drink alcohol. ? Take sleeping pills or medicines that cause drowsiness. ? Make important decisions or sign legal documents. ? Take care of children on your own.  Rest. Eating and drinking  Follow the diet that is recommended by your health care provider.  If you vomit, drink water, juice, or soup when you can drink without vomiting.  Make sure you have little or no nausea before eating solid foods. General instructions  Have a responsible adult stay with you until you are awake and alert.  Take over-the-counter and prescription medicines only as told by your health care  provider.  If you smoke, do not smoke without supervision.  Keep all follow-up visits as told by your health care provider. This is important. Contact a health care provider if:  You keep feeling nauseous or you keep vomiting.  You feel light-headed.  You develop a rash.  You have a fever. Get help right away if:  You have trouble breathing. This information is not intended to replace advice given to you by your health care provider. Make sure you discuss any questions you have with your health care provider. Document Released: 07/12/2015 Document Revised: 11/11/2015 Document Reviewed: 07/12/2015 Elsevier Interactive Patient Education  [employer].

## 2017-07-07 ENCOUNTER — Encounter (HOSPITAL_COMMUNITY): Payer: Self-pay

## 2017-07-07 ENCOUNTER — Encounter (HOSPITAL_COMMUNITY)
Admission: RE | Admit: 2017-07-07 | Discharge: 2017-07-07 | Disposition: A | Discharge status: Home / Self Care | Payer: Medicare Other | Source: Ambulatory Visit | Attending: Internal Medicine | Admitting: Internal Medicine

## 2017-07-07 ENCOUNTER — Other Ambulatory Visit: Payer: Self-pay

## 2017-07-07 DIAGNOSIS — I493 Ventricular premature depolarization: Secondary | ICD-10-CM | POA: Diagnosis not present

## 2017-07-07 DIAGNOSIS — Z01818 Encounter for other preprocedural examination: Secondary | ICD-10-CM | POA: Insufficient documentation

## 2017-07-07 DIAGNOSIS — Z01812 Encounter for preprocedural laboratory examination: Secondary | ICD-10-CM | POA: Insufficient documentation

## 2017-07-07 DIAGNOSIS — R9431 Abnormal electrocardiogram [ECG] [EKG]: Secondary | ICD-10-CM | POA: Diagnosis not present

## 2017-07-07 HISTORY — DX: Major depressive disorder, single episode, unspecified: F32.9

## 2017-07-07 HISTORY — DX: Unspecified osteoarthritis, unspecified site: M19.90

## 2017-07-07 HISTORY — DX: Depression, unspecified: F32.A

## 2017-07-07 LAB — BASIC METABOLIC PANEL
ANION GAP: 11 (ref 5–15)
BUN: 7 mg/dL (ref 6–20)
CALCIUM: 8.7 mg/dL — AB (ref 8.9–10.3)
CHLORIDE: 99 mmol/L — AB (ref 101–111)
CO2: 25 mmol/L (ref 22–32)
Creatinine, Ser: 0.65 mg/dL (ref 0.44–1.00)
GFR calc non Af Amer: >60 mL/min (ref >60)
GLUCOSE: 256 mg/dL — AB (ref 65–99)
Potassium: 3.8 mmol/L (ref 3.5–5.1)
Sodium: 135 mmol/L (ref 135–145)

## 2017-07-07 LAB — CBC
HEMATOCRIT: 40.7 % (ref 36.0–46.0)
HEMOGLOBIN: 12.8 g/dL (ref 12.0–15.0)
MCH: 28.1 pg (ref 26.0–34.0)
MCHC: 31.4 g/dL (ref 30.0–36.0)
MCV: 89.3 fL (ref 78.0–100.0)
Platelets: 173 10*3/uL (ref 150–400)
RBC: 4.56 MIL/uL (ref 3.87–5.11)
RDW: 13.5 % (ref 11.5–15.5)
WBC: 9 10*3/uL (ref 4.0–10.5)

## 2017-07-13 ENCOUNTER — Ambulatory Visit (HOSPITAL_COMMUNITY)
Admission: RE | Admit: 2017-07-13 | Discharge: 2017-07-13 | Disposition: A | Discharge status: Home / Self Care | Payer: Medicare Other | Source: Ambulatory Visit | Attending: Internal Medicine | Admitting: Internal Medicine

## 2017-07-13 ENCOUNTER — Encounter (HOSPITAL_COMMUNITY)
Admission: RE | Disposition: A | Discharge status: Home / Self Care | Payer: Self-pay | Source: Ambulatory Visit | Attending: Internal Medicine

## 2017-07-13 ENCOUNTER — Ambulatory Visit (HOSPITAL_COMMUNITY): Payer: Medicare Other | Admitting: Anesthesiology

## 2017-07-13 DIAGNOSIS — I739 Peripheral vascular disease, unspecified: Secondary | ICD-10-CM | POA: Diagnosis not present

## 2017-07-13 DIAGNOSIS — K3189 Other diseases of stomach and duodenum: Secondary | ICD-10-CM | POA: Diagnosis not present

## 2017-07-13 DIAGNOSIS — Z888 Allergy status to other drugs, medicaments and biological substances status: Secondary | ICD-10-CM | POA: Insufficient documentation

## 2017-07-13 DIAGNOSIS — R1310 Dysphagia, unspecified: Secondary | ICD-10-CM

## 2017-07-13 DIAGNOSIS — I69359 Hemiplegia and hemiparesis following cerebral infarction affecting unspecified side: Secondary | ICD-10-CM | POA: Diagnosis not present

## 2017-07-13 DIAGNOSIS — Z800 Family history of malignant neoplasm of digestive organs: Secondary | ICD-10-CM | POA: Diagnosis not present

## 2017-07-13 DIAGNOSIS — K210 Gastro-esophageal reflux disease with esophagitis: Secondary | ICD-10-CM | POA: Insufficient documentation

## 2017-07-13 DIAGNOSIS — Z794 Long term (current) use of insulin: Secondary | ICD-10-CM | POA: Diagnosis not present

## 2017-07-13 DIAGNOSIS — Z7902 Long term (current) use of antithrombotics/antiplatelets: Secondary | ICD-10-CM | POA: Insufficient documentation

## 2017-07-13 DIAGNOSIS — K209 Esophagitis, unspecified: Secondary | ICD-10-CM | POA: Diagnosis not present

## 2017-07-13 DIAGNOSIS — E785 Hyperlipidemia, unspecified: Secondary | ICD-10-CM | POA: Diagnosis not present

## 2017-07-13 DIAGNOSIS — K5730 Diverticulosis of large intestine without perforation or abscess without bleeding: Secondary | ICD-10-CM | POA: Insufficient documentation

## 2017-07-13 DIAGNOSIS — D125 Benign neoplasm of sigmoid colon: Secondary | ICD-10-CM

## 2017-07-13 DIAGNOSIS — Z880 Allergy status to penicillin: Secondary | ICD-10-CM | POA: Insufficient documentation

## 2017-07-13 DIAGNOSIS — Z1211 Encounter for screening for malignant neoplasm of colon: Secondary | ICD-10-CM | POA: Diagnosis present

## 2017-07-13 DIAGNOSIS — Z79899 Other long term (current) drug therapy: Secondary | ICD-10-CM | POA: Diagnosis not present

## 2017-07-13 DIAGNOSIS — F17210 Nicotine dependence, cigarettes, uncomplicated: Secondary | ICD-10-CM | POA: Diagnosis not present

## 2017-07-13 DIAGNOSIS — I110 Hypertensive heart disease with heart failure: Secondary | ICD-10-CM | POA: Diagnosis not present

## 2017-07-13 DIAGNOSIS — I509 Heart failure, unspecified: Secondary | ICD-10-CM | POA: Diagnosis not present

## 2017-07-13 DIAGNOSIS — D123 Benign neoplasm of transverse colon: Secondary | ICD-10-CM

## 2017-07-13 DIAGNOSIS — B1920 Unspecified viral hepatitis C without hepatic coma: Secondary | ICD-10-CM | POA: Diagnosis not present

## 2017-07-13 DIAGNOSIS — E119 Type 2 diabetes mellitus without complications: Secondary | ICD-10-CM | POA: Insufficient documentation

## 2017-07-13 DIAGNOSIS — K2950 Unspecified chronic gastritis without bleeding: Secondary | ICD-10-CM | POA: Diagnosis not present

## 2017-07-13 HISTORY — PX: MALONEY DILATION: SHX5535

## 2017-07-13 HISTORY — PX: COLONOSCOPY WITH PROPOFOL: SHX5780

## 2017-07-13 HISTORY — PX: ESOPHAGOGASTRODUODENOSCOPY (EGD) WITH PROPOFOL: SHX5813

## 2017-07-13 HISTORY — PX: POLYPECTOMY: SHX5525

## 2017-07-13 HISTORY — PX: BIOPSY: SHX5522

## 2017-07-13 LAB — GLUCOSE, CAPILLARY
Glucose-Capillary: 202 mg/dL — ABNORMAL HIGH (ref 65–99)
Glucose-Capillary: 131 mg/dL — ABNORMAL HIGH (ref 65–99)

## 2017-07-13 SURGERY — COLONOSCOPY WITH PROPOFOL
Anesthesia: Monitor Anesthesia Care

## 2017-07-13 MED ORDER — FENTANYL CITRATE (PF) 100 MCG/2ML IJ SOLN
INTRAMUSCULAR | Status: DC | PRN
  Administered 2017-07-13: 50 ug via INTRAVENOUS

## 2017-07-13 MED ORDER — FENTANYL CITRATE (PF) 100 MCG/2ML IJ SOLN
INTRAMUSCULAR | Status: AC
  Filled 2017-07-13: qty 2

## 2017-07-13 MED ORDER — LACTATED RINGERS IV SOLN
INTRAVENOUS | Status: DC
  Administered 2017-07-13: 13:00:00 via INTRAVENOUS
  Administered 2017-07-13: 1000 mL via INTRAVENOUS

## 2017-07-13 MED ORDER — LIDOCAINE VISCOUS 2 % MT SOLN
3.0000 mL | OROMUCOSAL | Status: AC | PRN
  Administered 2017-07-13 (×2): 3 mL via OROMUCOSAL

## 2017-07-13 MED ORDER — LIDOCAINE VISCOUS 2 % MT SOLN
OROMUCOSAL | Status: AC
  Filled 2017-07-13: qty 15

## 2017-07-13 MED ORDER — ONDANSETRON HCL 4 MG/2ML IJ SOLN
INTRAMUSCULAR | Status: DC | PRN
  Administered 2017-07-13: 4 mg via INTRAVENOUS

## 2017-07-13 MED ORDER — PROPOFOL 500 MG/50ML IV EMUL
INTRAVENOUS | Status: DC | PRN
  Administered 2017-07-13: 75 ug/kg/min via INTRAVENOUS

## 2017-07-13 MED ORDER — MIDAZOLAM HCL 2 MG/2ML IJ SOLN
INTRAMUSCULAR | Status: AC
  Filled 2017-07-13: qty 2

## 2017-07-13 MED ORDER — CHLORHEXIDINE GLUCONATE CLOTH 2 % EX PADS: 6.0000 | MEDICATED_PAD | Freq: Once | CUTANEOUS | Status: DC

## 2017-07-13 MED ORDER — MIDAZOLAM HCL 5 MG/5ML IJ SOLN
INTRAMUSCULAR | Status: DC | PRN
  Administered 2017-07-13 (×2): 1 mg via INTRAVENOUS

## 2017-07-13 MED ORDER — PROPOFOL 10 MG/ML IV BOLUS
INTRAVENOUS | Status: DC | PRN
  Administered 2017-07-13: 20 mg via INTRAVENOUS

## 2017-07-13 NOTE — Op Note (Signed)
[facility] Patient Name: Rose Greer Procedure Date: 07/13/2017 1:03 PM MRN: [id number] Date of Birth: Dec 21, 1955 Attending MD: Norvel Richards , MD CSN: [id number] Age: 62 Admit Type: Outpatient Procedure:                Colonoscopy Indications:              Screening in patient at increased risk: Family                            history of 1st-degree relative with colorectal                            cancer Providers:                Norvel Richards, MD, Janeece Riggers, RN, Aram Candela Referring MD:              Medicines:                Propofol per Anesthesia Complications:            No immediate complications. Estimated Blood Loss:     Estimated blood loss was minimal. Procedure:                Pre-Anesthesia Assessment:                           - Prior to the procedure, a History and Physical                            was performed, and patient medications and                            allergies were reviewed. The patient's tolerance of                            previous anesthesia was also reviewed. The risks                            and benefits of the procedure and the sedation                            options and risks were discussed with the patient.                            All questions were answered, and informed consent                            was obtained. Prior Anticoagulants: The patient has                            taken no previous anticoagulant or antiplatelet                            agents. ASA Grade Assessment:  II - A patient with                            mild systemic disease. After reviewing the risks                            and benefits, the patient was deemed in                            satisfactory condition to undergo the procedure.                           After obtaining informed consent, the colonoscope                            was passed under direct vision. Throughout the                         procedure, the patient's blood pressure, pulse, and                            oxygen saturations were monitored continuously. The                            [id number] ([id number]) scope was introduced through                            the anus and advanced to the the cecum, identified                            by appendiceal orifice and ileocecal valve. Scope In: 1:31:59 PM Scope Out: 1:46:13 PM Scope Withdrawal Time: 0 hours 11 minutes 1 second  Total Procedure Duration: 0 hours 14 minutes 14 seconds  Findings:      The perianal and digital rectal examinations were normal.      Scattered medium-mouthed diverticula were found in the sigmoid colon and       descending colon.      Three sessile polyps were found in the sigmoid colon and splenic       flexure. The polyps were 4 to 6 mm in size. These polyps were removed       with a cold snare. Resection and retrieval were complete. Estimated       blood loss was minimal.      The exam was otherwise without abnormality on direct and retroflexion       views. Impression:               - Diverticulosis in the sigmoid colon and in the                            descending colon.                           - Three 4 to 6 mm polyps in the sigmoid colon and  at the splenic flexure, removed with a cold snare.                            Resected and retrieved.                           - The examination was otherwise normal on direct                            and retroflexion views. Moderate Sedation:      Moderate (conscious) sedation was personally administered by an       anesthesia professional. The following parameters were monitored: oxygen       saturation, heart rate, blood pressure, respiratory rate, EKG, adequacy       of pulmonary ventilation, and response to care. Total physician       intraservice time was 38 minutes. Recommendation:           - Written discharge instructions were  provided to                            the patient.                           - Patient has a contact number available for                            emergencies.                           - Return to normal activities tomorrow.                           - Advance diet as tolerated.                           - Continue present medications.                           - Repeat colonoscopy date to be determined after                            pending pathology results are reviewed for                            surveillance based on pathology results.                           - Return to GI office in 3 months. See EGD report. Procedure Code(s):        --- Professional ---                           [phone], Colonoscopy, flexible; with removal of                            tumor(s), polyp(s), or other lesion(s) by snare  technique Diagnosis Code(s):        --- Professional ---                           Z80.0, Family history of malignant neoplasm of                            digestive organs                           D12.5, Benign neoplasm of sigmoid colon                           D12.3, Benign neoplasm of transverse colon (hepatic                            flexure or splenic flexure)                           K57.30, Diverticulosis of large intestine without                            perforation or abscess without bleeding CPT copyright 2017 [employer]. All rights reserved. The codes documented in this report are preliminary and upon coder review may  be revised to meet current compliance requirements. Cristopher Estimable. Venda Dice, MD Norvel Richards, MD 07/13/2017 1:58:02 PM This report has been signed electronically. Number of Addenda: 0

## 2017-07-13 NOTE — Op Note (Signed)
[facility] Patient Name: Rose Greer Procedure Date: 07/13/2017 12:56 PM MRN: [id number] Date of Birth: 1956-02-04 Attending MD: Norvel Richards , MD CSN: [id number] Age: 62 Admit Type: Outpatient Procedure:                Upper GI endoscopy Indications:              Dysphagia Providers:                Norvel Richards, MD, Janeece Riggers, RN, Aram Candela Referring MD:             Abran Richard Medicines:                Propofol per Anesthesia Complications:            No immediate complications. Estimated Blood Loss:     Estimated blood loss was minimal. Procedure:                Pre-Anesthesia Assessment:                           - Prior to the procedure, a History and Physical                            was performed, and patient medications and                            allergies were reviewed. The patient's tolerance of                            previous anesthesia was also reviewed. The risks                            and benefits of the procedure and the sedation                            options and risks were discussed with the patient.                            All questions were answered, and informed consent                            was obtained. Prior Anticoagulants: The patient has                            taken no previous anticoagulant or antiplatelet                            agents. ASA Grade Assessment: II - A patient with                            mild systemic disease. After reviewing the risks  and benefits, the patient was deemed in                            satisfactory condition to undergo the procedure.                           After obtaining informed consent, the endoscope was                            passed under direct vision. Throughout the                            procedure, the patient's blood pressure, pulse, and                            oxygen saturations were  monitored continuously. The                            [id number] ([id number]) scope was introduced through the                            mouth, and advanced to the second part of duodenum.                            The upper GI endoscopy was accomplished without                            difficulty. The patient tolerated the procedure                            well. Scope In: 1:19:37 PM Scope Out: 1:25:55 PM Total Procedure Duration: 0 hours 6 minutes 18 seconds  Findings:      Esophagitis was found. couple small distal esophageal erosions. No       tumor. No Barrett's epithelium seen. Tubular esophagus patent throughout       its course.The scope was withdrawn. Dilation was performed with a       Maloney dilator with mild resistance at 32 Fr. The dilation site was       examined following endoscope reinsertion and showed no change. Estimated       blood loss: none.      This was biopsied with a cold forceps for histology. Estimated blood       loss was minimal.      Diffuse granular mucosa was found in the entire examined stomach.      Multiple petechiae were found in the entire examined stomach.      The duodenal bulb and second portion of the duodenum were normal. Impression:               - Mild Reflux Esophagitis. Dilated.                           - Gastric petechia(e). Biopsied.                           - Granular gastric mucosa.                           -  Normal duodenal bulb and second portion of the                            duodenum. Moderate Sedation:      Moderate (conscious) sedation was personally administered by an       anesthesia professional. The following parameters were monitored: oxygen       saturation, heart rate, blood pressure, respiratory rate, EKG, adequacy       of pulmonary ventilation, and response to care. Total physician       intraservice time was 21 minutes. Recommendation:           - Patient has a contact number available for                             emergencies. The signs and symptoms of potential                            delayed complications were discussed with the                            patient. Return to normal activities tomorrow.                            Written discharge instructions were provided to the                            patient.                           - Resume previous diet.                           - Continue present medications. Continue Protonix                            40 milligrams daily.                           - No repeat upper endoscopy.                           - Return to GI office in 3 months. See colonoscopy                            report. Procedure Code(s):        --- Professional ---                           [phone], Esophagogastroduodenoscopy, flexible,                            transoral; with biopsy, single or multiple                           43450, Dilation of esophagus, by unguided sound or  bougie, single or multiple passes Diagnosis Code(s):        --- Professional ---                           K20.9, Esophagitis, unspecified                           K31.89, Other diseases of stomach and duodenum                           R13.10, Dysphagia, unspecified CPT copyright 2017 [employer]. All rights reserved. The codes documented in this report are preliminary and upon coder review may  be revised to meet current compliance requirements. Cristopher Estimable. Justino Boze, MD Norvel Richards, MD 07/13/2017 1:53:52 PM This report has been signed electronically. Number of Addenda: 0

## 2017-07-13 NOTE — Interval H&P Note (Signed)
History and Physical Interval Note:  07/13/2017 12:17 PM  Rose Greer  has presented today for surgery, with the diagnosis of dysphagia, family history colon cancer  The various methods of treatment have been discussed with the patient and family. After consideration of risks, benefits and other options for treatment, the patient has consented to  Procedure(s) with comments: COLONOSCOPY WITH PROPOFOL (N/A) - 1:15pm ESOPHAGOGASTRODUODENOSCOPY (EGD) WITH PROPOFOL (N/A) MALONEY DILATION (N/A) as a surgical intervention .  The patient's history has been reviewed, patient examined, no change in status, stable for surgery.  I have reviewed the patient's chart and labs.  Questions were answered to the patient's satisfaction.     Rose Greer    No change. EGD with esophageal dilation as feasible per plan and colonoscopy per plan.  The risks, benefits, limitations, imponderables and alternatives regarding both EGD and colonoscopy have been reviewed with the patient. Questions have been answered. All parties agreeable.

## 2017-07-13 NOTE — Discharge Instructions (Signed)
Monitored Anesthesia Care Anesthesia is a term that refers to techniques, procedures, and medicines that help a person stay safe and comfortable during a medical procedure. Monitored anesthesia care, or sedation, is one type of anesthesia. Your anesthesia specialist may recommend sedation if you will be having a procedure that does not require you to be unconscious, such as:  Cataract surgery.  A dental procedure.  A biopsy.  A colonoscopy.  During the procedure, you may receive a medicine to help you relax (sedative). There are three levels of sedation:  Mild sedation. At this level, you may feel awake and relaxed. You will be able to follow directions.  Moderate sedation. At this level, you will be sleepy. You may not remember the procedure.  Deep sedation. At this level, you will be asleep. You will not remember the procedure.  The more medicine you are given, the deeper your level of sedation will be. Depending on how you respond to the procedure, the anesthesia specialist may change your level of sedation or the type of anesthesia to fit your needs. An anesthesia specialist will monitor you closely during the procedure. Let your health care provider know about:  Any allergies you have.  All medicines you are taking, including vitamins, herbs, eye drops, creams, and over-the-counter medicines.  Any use of steroids (by mouth or as a cream).  Any problems you or family members have had with sedatives and anesthetic medicines.  Any blood disorders you have.  Any surgeries you have had.  Any medical conditions you have, such as sleep apnea.  Whether you are pregnant or may be pregnant.  Any use of cigarettes, alcohol, or street drugs. What are the risks? Generally, this is a safe procedure. However, problems may occur, including:  Getting too much medicine (oversedation).  Nausea.  Allergic reaction to medicines.  Trouble breathing. If this happens, a breathing tube  may be used to help with breathing. It will be removed when you are awake and breathing on your own.  Heart trouble.  Lung trouble.  Before the procedure Staying hydrated Follow instructions from your health care provider about hydration, which may include:  Up to 2 hours before the procedure - you may continue to drink clear liquids, such as water, clear fruit juice, black coffee, and plain tea.  Eating and drinking restrictions Follow instructions from your health care provider about eating and drinking, which may include:  8 hours before the procedure - stop eating heavy meals or foods such as meat, fried foods, or fatty foods.  6 hours before the procedure - stop eating light meals or foods, such as toast or cereal.  6 hours before the procedure - stop drinking milk or drinks that contain milk.  2 hours before the procedure - stop drinking clear liquids.  Medicines Ask your health care provider about:  Changing or stopping your regular medicines. This is especially important if you are taking diabetes medicines or blood thinners.  Taking medicines such as aspirin and ibuprofen. These medicines can thin your blood. Do not take these medicines before your procedure if your health care provider instructs you not to.  Tests and exams  You will have a physical exam.  You may have blood tests done to show: ? How well your kidneys and liver are working. ? How well your blood can clot.  General instructions  Plan to have someone take you home from the hospital or clinic.  If you will be going home right after the  procedure, plan to have someone with you for 24 hours.  What happens during the procedure?  Your blood pressure, heart rate, breathing, level of pain and overall condition will be monitored.  An IV tube will be inserted into one of your veins.  Your anesthesia specialist will give you medicines as needed to keep you comfortable during the procedure. This may  mean changing the level of sedation.  The procedure will be performed. After the procedure  Your blood pressure, heart rate, breathing rate, and blood oxygen level will be monitored until the medicines you were given have worn off.  Do not drive for 24 hours if you received a sedative.  You may: ? Feel sleepy, clumsy, or nauseous. ? Feel forgetful about what happened after the procedure. ? Have a sore throat if you had a breathing tube during the procedure. ? Vomit. This information is not intended to replace advice given to you by your health care provider. Make sure you discuss any questions you have with your health care provider. Document Released: 12/15/2004 Document Revised: 08/28/2015 Document Reviewed: 07/12/2015 Elsevier Interactive Patient Education  2018 [employer].  Colonoscopy Discharge Instructions  Read the instructions outlined below and refer to this sheet in the next few weeks. These discharge instructions provide you with general information on caring for yourself after you leave the hospital. Your doctor may also give you specific instructions. While your treatment has been planned according to the most current medical practices available, unavoidable complications occasionally occur. If you have any problems or questions after discharge, call Dr. Gala Romney at [phone]. ACTIVITY  You may resume your regular activity, but move at a slower pace for the next 24 hours.   Take frequent rest periods for the next 24 hours.   Walking will help get rid of the air and reduce the bloated feeling in your belly (abdomen).   No driving for 24 hours (because of the medicine (anesthesia) used during the test).    Do not sign any important legal documents or operate any machinery for 24 hours (because of the anesthesia used during the test).  NUTRITION  Drink plenty of fluids.   You may resume your normal diet as instructed by your doctor.   Begin with a light meal and  progress to your normal diet. Heavy or fried foods are harder to digest and may make you feel sick to your stomach (nauseated).   Avoid alcoholic beverages for 24 hours or as instructed.  MEDICATIONS  You may resume your normal medications unless your doctor tells you otherwise.  WHAT YOU CAN EXPECT TODAY  Some feelings of bloating in the abdomen.   Passage of more gas than usual.   Spotting of blood in your stool or on the toilet paper.  IF YOU HAD POLYPS REMOVED DURING THE COLONOSCOPY:  No aspirin products for 7 days or as instructed.   No alcohol for 7 days or as instructed.   Eat a soft diet for the next 24 hours.  FINDING OUT THE RESULTS OF YOUR TEST Not all test results are available during your visit. If your test results are not back during the visit, make an appointment with your caregiver to find out the results. Do not assume everything is normal if you have not heard from your caregiver or the medical facility. It is important for you to follow up on all of your test results.  SEEK IMMEDIATE MEDICAL ATTENTION IF:  You have more than a spotting of blood  in your stool.   Your belly is swollen (abdominal distention).   You are nauseated or vomiting.   You have a temperature over 101.   You have abdominal pain or discomfort that is severe or gets worse throughout the day.  EGD Discharge instructions Please read the instructions outlined below and refer to this sheet in the next few weeks. These discharge instructions provide you with general information on caring for yourself after you leave the hospital. Your doctor may also give you specific instructions. While your treatment has been planned according to the most current medical practices available, unavoidable complications occasionally occur. If you have any problems or questions after discharge, please call your doctor. ACTIVITY  You may resume your regular activity but move at a slower pace for the next 24 hours.     Take frequent rest periods for the next 24 hours.   Walking will help expel (get rid of) the air and reduce the bloated feeling in your abdomen.   No driving for 24 hours (because of the anesthesia (medicine) used during the test).   You may shower.   Do not sign any important legal documents or operate any machinery for 24 hours (because of the anesthesia used during the test).  NUTRITION  Drink plenty of fluids.   You may resume your normal diet.   Begin with a light meal and progress to your normal diet.   Avoid alcoholic beverages for 24 hours or as instructed by your caregiver.  MEDICATIONS  You may resume your normal medications unless your caregiver tells you otherwise.  WHAT YOU CAN EXPECT TODAY  You may experience abdominal discomfort such as a feeling of fullness or gas pains.  FOLLOW-UP  Your doctor will discuss the results of your test with you.  SEEK IMMEDIATE MEDICAL ATTENTION IF ANY OF THE FOLLOWING OCCUR:  Excessive nausea (feeling sick to your stomach) and/or vomiting.   Severe abdominal pain and distention (swelling).   Trouble swallowing.   Temperature over 101 F (37.8 C).   Rectal bleeding or vomiting of blood.   Colon diverticulosis and polyp information provided  GERD information provided  Continue Protonix 40 mg daily  Further recommendations to follow pending review of pathology report   Colon Polyps Polyps are tissue growths inside the body. Polyps can grow in many places, including the large intestine (colon). A polyp may be a round bump or a mushroom-shaped growth. You could have one polyp or several. Most colon polyps are noncancerous (benign). However, some colon polyps can become cancerous over time. What are the causes? The exact cause of colon polyps is not known. What increases the risk? This condition is more likely to develop in people who:  Have a family history of colon cancer or colon polyps.  Are older than 16 or  older than 45 if they are African American.  Have inflammatory bowel disease, such as ulcerative colitis or Crohn disease.  Are overweight.  Smoke cigarettes.  Do not get enough exercise.  Drink too much alcohol.  Eat a diet that is: ? High in fat and red meat. ? Low in fiber.  Had childhood cancer that was treated with abdominal radiation.  What are the signs or symptoms? Most polyps do not cause symptoms. If you have symptoms, they may include:  Blood coming from your rectum when having a bowel movement.  Blood in your stool.The stool may look dark red or black.  A change in bowel habits, such as constipation  or diarrhea.  How is this diagnosed? This condition is diagnosed with a colonoscopy. This is a procedure that uses a lighted, flexible scope to look at the inside of your colon. How is this treated? Treatment for this condition involves removing any polyps that are found. Those polyps will then be tested for cancer. If cancer is found, your health care provider will talk to you about options for colon cancer treatment. Follow these instructions at home: Diet  Eat plenty of fiber, such as fruits, vegetables, and whole grains.  Eat foods that are high in calcium and vitamin D, such as milk, cheese, yogurt, eggs, liver, fish, and broccoli.  Limit foods high in fat, red meats, and processed meats, such as hot dogs, sausage, bacon, and lunch meats.  Maintain a healthy weight, or lose weight if recommended by your health care provider. General instructions  Do not smoke cigarettes.  Do not drink alcohol excessively.  Keep all follow-up visits as told by your health care provider. This is important. This includes keeping regularly scheduled colonoscopies. Talk to your health care provider about when you need a colonoscopy.  Exercise every day or as told by your health care provider. Contact a health care provider if:  You have new or worsening bleeding during a  bowel movement.  You have new or increased blood in your stool.  You have a change in bowel habits.  You unexpectedly lose weight. This information is not intended to replace advice given to you by your health care provider. Make sure you discuss any questions you have with your health care provider. Document Released: 12/16/2003 Document Revised: 08/27/2015 Document Reviewed: 02/09/2015 Elsevier Interactive Patient Education  2018 [employer].   Gastroesophageal Reflux Disease, Adult Normally, food travels down the esophagus and stays in the stomach to be digested. If a person has gastroesophageal reflux disease (GERD), food and stomach acid move back up into the esophagus. When this happens, the esophagus becomes sore and swollen (inflamed). Over time, GERD can make small holes (ulcers) in the lining of the esophagus. Follow these instructions at home: Diet  Follow a diet as told by your doctor. You may need to avoid foods and drinks such as: ? Coffee and tea (with or without caffeine). ? Drinks that contain alcohol. ? Energy drinks and sports drinks. ? Carbonated drinks or sodas. ? Chocolate and cocoa. ? Peppermint and mint flavorings. ? Garlic and onions. ? Horseradish. ? Spicy and acidic foods, such as peppers, chili powder, curry powder, vinegar, hot sauces, and BBQ sauce. ? Citrus fruit juices and citrus fruits, such as oranges, lemons, and limes. ? Tomato-based foods, such as red sauce, chili, salsa, and pizza with red sauce. ? Fried and fatty foods, such as donuts, french fries, potato chips, and high-fat dressings. ? High-fat meats, such as hot dogs, rib eye steak, sausage, ham, and bacon. ? High-fat dairy items, such as whole milk, butter, and cream cheese.  Eat small meals often. Avoid eating large meals.  Avoid drinking large amounts of liquid with your meals.  Avoid eating meals during the 2-3 hours before bedtime.  Avoid lying down right after you eat.  Do  not exercise right after you eat. General instructions  Pay attention to any changes in your symptoms.  Take over-the-counter and prescription medicines only as told by your doctor. Do not take aspirin, ibuprofen, or other NSAIDs unless your doctor says it is okay.  Do not use any tobacco products, including cigarettes, chewing tobacco, and  e-cigarettes. If you need help quitting, ask your doctor.  Wear loose clothes. Do not wear anything tight around your waist.  Raise (elevate) the head of your bed about 6 inches (15 cm).  Try to lower your stress. If you need help doing this, ask your doctor.  If you are overweight, lose an amount of weight that is healthy for you. Ask your doctor about a safe weight loss goal.  Keep all follow-up visits as told by your doctor. This is important. Contact a doctor if:  You have new symptoms.  You lose weight and you do not know why it is happening.  You have trouble swallowing, or it hurts to swallow.  You have wheezing or a cough that keeps happening.  Your symptoms do not get better with treatment.  You have a hoarse voice. Get help right away if:  You have pain in your arms, neck, jaw, teeth, or back.  You feel sweaty, dizzy, or light-headed.  You have chest pain or shortness of breath.  You throw up (vomit) and your throw up looks like blood or coffee grounds.  You pass out (faint).  Your poop (stool) is bloody or black.  You cannot swallow, drink, or eat. This information is not intended to replace advice given to you by your health care provider. Make sure you discuss any questions you have with your health care provider. Document Released: 09/07/2007 Document Revised: 08/27/2015 Document Reviewed: 07/16/2014 Elsevier Interactive Patient Education  2018 [employer].    Colon Polyps Polyps are tissue growths inside the body. Polyps can grow in many places, including the large intestine (colon). A polyp may be a round bump  or a mushroom-shaped growth. You could have one polyp or several. Most colon polyps are noncancerous (benign). However, some colon polyps can become cancerous over time. What are the causes? The exact cause of colon polyps is not known. What increases the risk? This condition is more likely to develop in people who:  Have a family history of colon cancer or colon polyps.  Are older than 27 or older than 45 if they are African American.  Have inflammatory bowel disease, such as ulcerative colitis or Crohn disease.  Are overweight.  Smoke cigarettes.  Do not get enough exercise.  Drink too much alcohol.  Eat a diet that is: ? High in fat and red meat. ? Low in fiber.  Had childhood cancer that was treated with abdominal radiation.  What are the signs or symptoms? Most polyps do not cause symptoms. If you have symptoms, they may include:  Blood coming from your rectum when having a bowel movement.  Blood in your stool.The stool may look dark red or black.  A change in bowel habits, such as constipation or diarrhea.  How is this diagnosed? This condition is diagnosed with a colonoscopy. This is a procedure that uses a lighted, flexible scope to look at the inside of your colon. How is this treated? Treatment for this condition involves removing any polyps that are found. Those polyps will then be tested for cancer. If cancer is found, your health care provider will talk to you about options for colon cancer treatment. Follow these instructions at home: Diet  Eat plenty of fiber, such as fruits, vegetables, and whole grains.  Eat foods that are high in calcium and vitamin D, such as milk, cheese, yogurt, eggs, liver, fish, and broccoli.  Limit foods high in fat, red meats, and processed meats, such as hot  dogs, sausage, bacon, and lunch meats.  Maintain a healthy weight, or lose weight if recommended by your health care provider. General instructions  Do not smoke  cigarettes.  Do not drink alcohol excessively.  Keep all follow-up visits as told by your health care provider. This is important. This includes keeping regularly scheduled colonoscopies. Talk to your health care provider about when you need a colonoscopy.  Exercise every day or as told by your health care provider. Contact a health care provider if:  You have new or worsening bleeding during a bowel movement.  You have new or increased blood in your stool.  You have a change in bowel habits.  You unexpectedly lose weight. This information is not intended to replace advice given to you by your health care provider. Make sure you discuss any questions you have with your health care provider. Document Released: 12/16/2003 Document Revised: 08/27/2015 Document Reviewed: 02/09/2015 Elsevier Interactive Patient Education  [employer].

## 2017-07-13 NOTE — Anesthesia Preprocedure Evaluation (Signed)
Anesthesia Evaluation  Patient identified by MRN, date of birth, ID band Patient awake    Reviewed: Allergy & Precautions, H&P , NPO status , Patient's Chart, lab work & pertinent test results  Airway Mallampati: II  TM Distance: >3 FB Neck ROM: full    Dental no notable dental hx. (+) Edentulous Upper, Edentulous Lower   Pulmonary neg pulmonary ROS, Current Smoker,    Pulmonary exam normal breath sounds clear to auscultation + decreased breath sounds      Cardiovascular Exercise Tolerance: Good hypertension, On Medications + Peripheral Vascular Disease and +CHF  negative cardio ROS   Rhythm:regular Rate:Normal     Neuro/Psych CVA negative neurological ROS  negative psych ROS   GI/Hepatic negative GI ROS, Neg liver ROS, GERD  ,  Endo/Other  negative endocrine ROSdiabetes, Type 2  Renal/GU negative Renal ROS  negative genitourinary   Musculoskeletal   Abdominal   Peds  Hematology negative hematology ROS (+)   Anesthesia Other Findings H?O tobacco abuse with cardiovascular disease, 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  Reproductive/Obstetrics negative OB ROS                             Anesthesia Physical Anesthesia Plan  ASA: IV  Anesthesia Plan: MAC   Post-op Pain Management:    Induction:   PONV Risk Score and Plan:   Airway Management Planned:   Additional Equipment:   Intra-op Plan:   Post-operative Plan:   Informed Consent: I have reviewed the patients History and Physical, chart, labs and discussed the procedure including the risks, benefits and alternatives for the proposed anesthesia with the patient or authorized representative who has indicated his/her understanding and acceptance.   Dental Advisory Given  Plan Discussed with: CRNA  Anesthesia Plan Comments:         Anesthesia Quick Evaluation

## 2017-07-13 NOTE — Anesthesia Postprocedure Evaluation (Signed)
Anesthesia Post Note  Patient: Rose Greer  Procedure(s) Performed: COLONOSCOPY WITH PROPOFOL (N/A ) ESOPHAGOGASTRODUODENOSCOPY (EGD) WITH PROPOFOL (N/A ) MALONEY DILATION (N/A ) BIOPSY POLYPECTOMY  Patient location during evaluation: PACU Anesthesia Type: MAC Level of consciousness: awake and alert and oriented Pain management: pain level controlled Vital Signs Assessment: post-procedure vital signs reviewed and stable Respiratory status: nonlabored ventilation, respiratory function stable and spontaneous breathing Cardiovascular status: stable Postop Assessment: no apparent nausea or vomiting Anesthetic complications: no     Last Vitals:  Vitals:   07/13/17 1305 07/13/17 1310  BP: (!) 153/88 (!) 152/94  Resp: 20 (!) 24  Temp:    SpO2: 99% 96%    Last Pain:  Vitals:   07/13/17 1317  TempSrc:   PainSc: 0-No pain                 Kylen Schliep

## 2017-07-13 NOTE — Transfer of Care (Signed)
Immediate Anesthesia Transfer of Care Note  Patient: Rose Greer  Procedure(s) Performed: COLONOSCOPY WITH PROPOFOL (N/A ) ESOPHAGOGASTRODUODENOSCOPY (EGD) WITH PROPOFOL (N/A ) MALONEY DILATION (N/A ) BIOPSY POLYPECTOMY  Patient Location: PACU  Anesthesia Type:MAC  Level of Consciousness: awake, alert  and oriented  Airway & Oxygen Therapy: Patient Spontanous Breathing and Patient connected to nasal cannula oxygen  Post-op Assessment: Report given to RN and Post -op Vital signs reviewed and stable  Post vital signs: Reviewed and stable  Last Vitals:  Vitals Value Taken Time  BP 146/86 07/13/2017  1:52 PM  Temp    Pulse 80 07/13/2017  1:56 PM  Resp 24 07/13/2017  1:56 PM  SpO2 97 % 07/13/2017  1:56 PM  Vitals shown include unvalidated device data.  Last Pain:  Vitals:   07/13/17 1317  TempSrc:   PainSc: 0-No pain         Complications: No apparent anesthesia complications

## 2017-07-14 ENCOUNTER — Encounter: Payer: Self-pay | Admitting: Internal Medicine

## 2017-07-18 ENCOUNTER — Encounter (HOSPITAL_COMMUNITY): Payer: Self-pay | Admitting: Internal Medicine

## 2017-07-18 ENCOUNTER — Encounter: Payer: Self-pay | Admitting: Internal Medicine

## 2017-08-07 NOTE — Progress Notes (Signed)
Outside labs from PCP>  Jan 2019: Negative HIV antibody. Hep A total antibody reactive (immune), Hep B surface antibody positive, Hep B core antibody non-reactive, Hep C genotype 1b, fibrosis score F3 on blood test. Treatment will be with PCP, as this is already in process.

## 2017-10-13 ENCOUNTER — Encounter: Payer: Self-pay | Admitting: Internal Medicine

## 2017-10-13 ENCOUNTER — Telehealth: Payer: Self-pay | Admitting: Internal Medicine

## 2017-10-13 ENCOUNTER — Ambulatory Visit: Payer: Medicare Other | Admitting: Gastroenterology

## 2017-10-13 NOTE — Telephone Encounter (Signed)
PATIENT WAS A NO SHOW AND LETTER SENT  °

## 2017-11-15 ENCOUNTER — Other Ambulatory Visit (HOSPITAL_COMMUNITY): Payer: Self-pay | Admitting: Internal Medicine

## 2017-11-15 DIAGNOSIS — Z1231 Encounter for screening mammogram for malignant neoplasm of breast: Secondary | ICD-10-CM

## 2017-11-23 IMAGING — DX DG CHEST 2V
2 series · 2 of 2 positions shown · non-contrast
Comparison: None.

CLINICAL DATA: Shortness of breath on left shoulder pain

EXAM:
CHEST  2 VIEW

[chest lat]
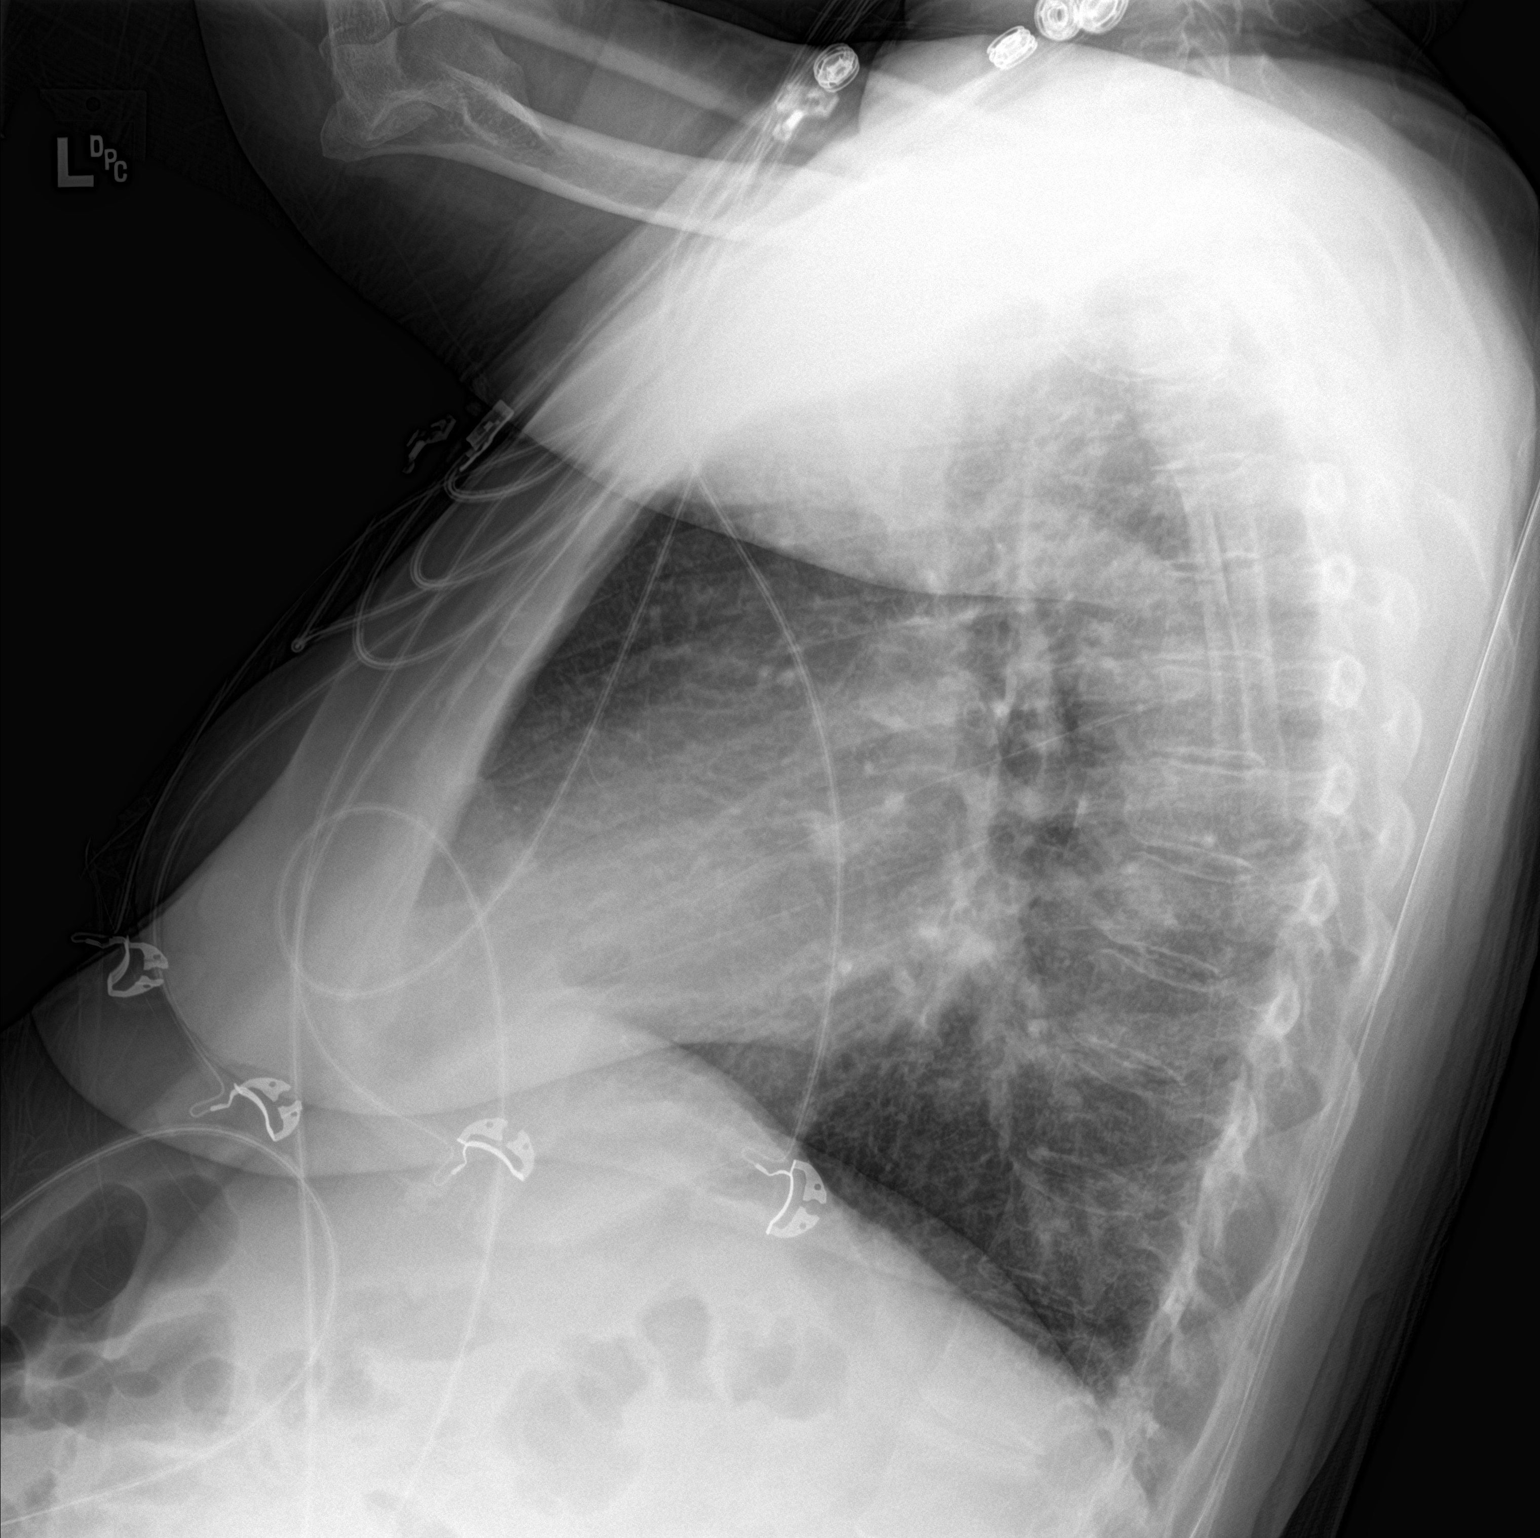

[chest ap]
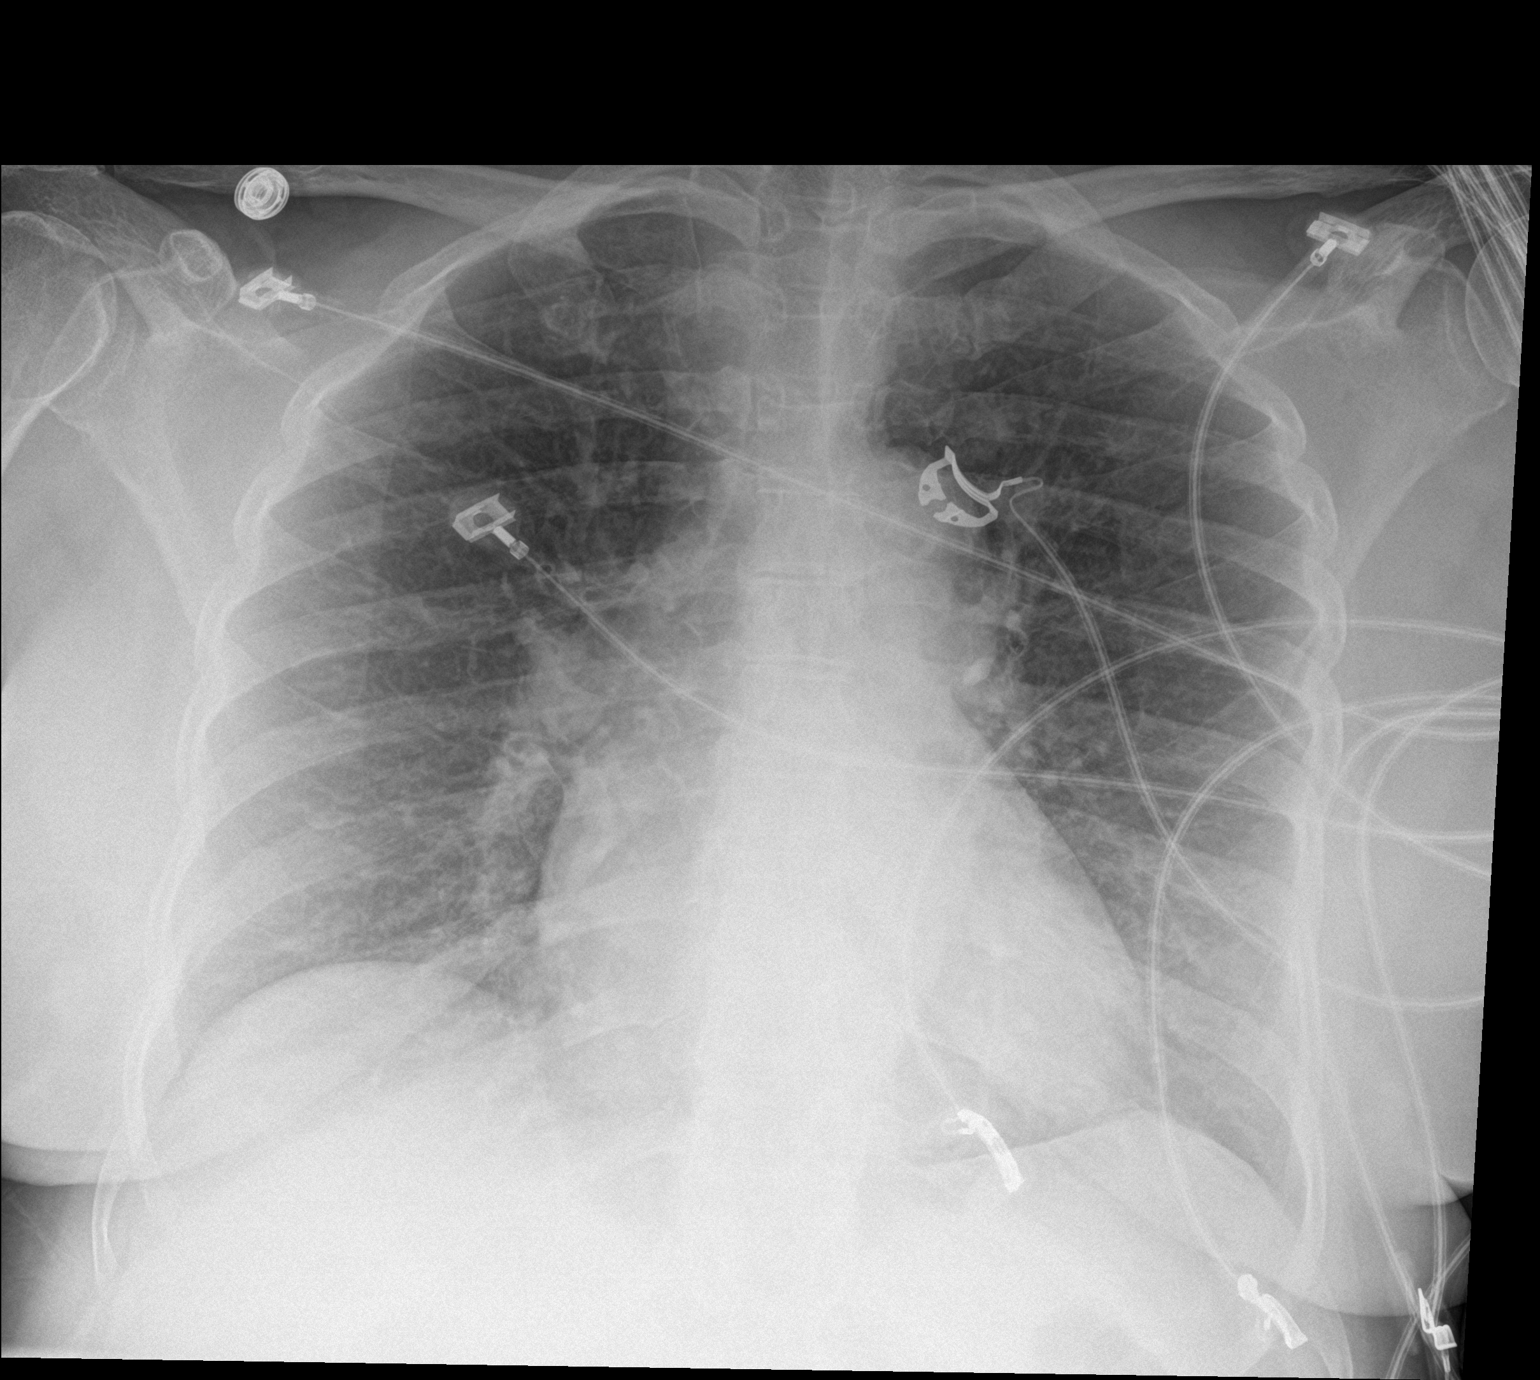

[2 of 2 positions shown; findings below may reference images not displayed]

FINDINGS: Mild bilateral parahilar and central peribronchial opacities, likely
indicating early pulmonary edema. No pleural effusion or
pneumothorax. No focal airspace consolidation. Normal
cardiomediastinal contours.
IMPRESSION: Central pulmonary vascular congestion or early pulmonary edema
without focal consolidation.

## 2017-11-27 ENCOUNTER — Ambulatory Visit (HOSPITAL_COMMUNITY): Payer: Medicare Other

## 2017-12-13 IMAGING — NM NM MYOCAR MULTI W/SPECT W/WALL MOTION & EF
2 series · 12 of 12 positions shown · non-contrast
Comparison: none

[Series 1: rest · 8.28mm/px · 6 of 64 frames shown]
[frame 6/64]
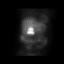
[frame 16/64]
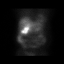
[frame 27/64]
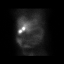
[frame 38/64]
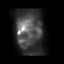
[frame 48/64]
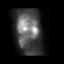
[frame 59/64]
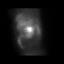

[Series 2: stress gated · 8.28mm/px · 6 of 64 frames shown]
[frame 6/64]
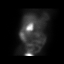
[frame 16/64]
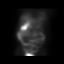
[frame 27/64]
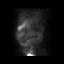
[frame 38/64]
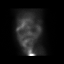
[frame 48/64]
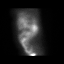
[frame 59/64]
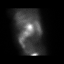

[12 of 12 positions shown; findings below may reference images not displayed]

Canned report from images found in remote index.

Refer to host system for actual result text.

## 2018-05-18 ENCOUNTER — Other Ambulatory Visit: Payer: Self-pay | Admitting: Gastroenterology

## 2018-05-31 ENCOUNTER — Other Ambulatory Visit (HOSPITAL_COMMUNITY): Payer: Self-pay | Admitting: Internal Medicine

## 2018-05-31 DIAGNOSIS — Z1231 Encounter for screening mammogram for malignant neoplasm of breast: Secondary | ICD-10-CM

## 2018-06-20 ENCOUNTER — Ambulatory Visit (HOSPITAL_COMMUNITY): Payer: Medicare Other

## 2018-06-20 ENCOUNTER — Encounter (HOSPITAL_COMMUNITY): Payer: Self-pay

## 2019-06-10 ENCOUNTER — Other Ambulatory Visit: Payer: Self-pay | Admitting: Gastroenterology

## 2019-06-10 NOTE — Telephone Encounter (Signed)
Will provide limited refills; needs office visit for further refills.

## 2019-08-08 ENCOUNTER — Other Ambulatory Visit: Payer: Self-pay | Admitting: Gastroenterology

## 2019-09-30 ENCOUNTER — Other Ambulatory Visit (HOSPITAL_COMMUNITY): Payer: Self-pay | Admitting: Internal Medicine

## 2019-09-30 DIAGNOSIS — Z13820 Encounter for screening for osteoporosis: Secondary | ICD-10-CM

## 2019-10-27 ENCOUNTER — Other Ambulatory Visit: Payer: Self-pay

## 2019-10-27 ENCOUNTER — Encounter (HOSPITAL_COMMUNITY): Payer: Self-pay | Admitting: Emergency Medicine

## 2019-10-27 ENCOUNTER — Emergency Department (HOSPITAL_COMMUNITY): Payer: Medicare Other

## 2019-10-27 ENCOUNTER — Emergency Department (HOSPITAL_COMMUNITY)
Admission: EM | Admit: 2019-10-27 | Discharge: 2019-10-28 | Disposition: A | Discharge status: Home / Self Care | Payer: Medicare Other | Attending: Emergency Medicine | Admitting: Emergency Medicine

## 2019-10-27 DIAGNOSIS — E119 Type 2 diabetes mellitus without complications: Secondary | ICD-10-CM | POA: Diagnosis not present

## 2019-10-27 DIAGNOSIS — R072 Precordial pain: Secondary | ICD-10-CM | POA: Insufficient documentation

## 2019-10-27 DIAGNOSIS — I110 Hypertensive heart disease with heart failure: Secondary | ICD-10-CM | POA: Diagnosis not present

## 2019-10-27 DIAGNOSIS — F17210 Nicotine dependence, cigarettes, uncomplicated: Secondary | ICD-10-CM | POA: Insufficient documentation

## 2019-10-27 DIAGNOSIS — Z85038 Personal history of other malignant neoplasm of large intestine: Secondary | ICD-10-CM | POA: Insufficient documentation

## 2019-10-27 DIAGNOSIS — Z794 Long term (current) use of insulin: Secondary | ICD-10-CM | POA: Diagnosis not present

## 2019-10-27 DIAGNOSIS — Y939 Activity, unspecified: Secondary | ICD-10-CM | POA: Insufficient documentation

## 2019-10-27 DIAGNOSIS — I160 Hypertensive urgency: Secondary | ICD-10-CM | POA: Diagnosis not present

## 2019-10-27 DIAGNOSIS — Z79899 Other long term (current) drug therapy: Secondary | ICD-10-CM | POA: Insufficient documentation

## 2019-10-27 DIAGNOSIS — I10 Essential (primary) hypertension: Secondary | ICD-10-CM | POA: Insufficient documentation

## 2019-10-27 DIAGNOSIS — I5032 Chronic diastolic (congestive) heart failure: Secondary | ICD-10-CM | POA: Diagnosis not present

## 2019-10-27 DIAGNOSIS — R0789 Other chest pain: Secondary | ICD-10-CM | POA: Diagnosis present

## 2019-10-27 DIAGNOSIS — S46812A Strain of other muscles, fascia and tendons at shoulder and upper arm level, left arm, initial encounter: Secondary | ICD-10-CM | POA: Insufficient documentation

## 2019-10-27 DIAGNOSIS — Y999 Unspecified external cause status: Secondary | ICD-10-CM | POA: Insufficient documentation

## 2019-10-27 DIAGNOSIS — Y929 Unspecified place or not applicable: Secondary | ICD-10-CM | POA: Insufficient documentation

## 2019-10-27 DIAGNOSIS — X58XXXA Exposure to other specified factors, initial encounter: Secondary | ICD-10-CM | POA: Diagnosis not present

## 2019-10-27 DIAGNOSIS — Z7902 Long term (current) use of antithrombotics/antiplatelets: Secondary | ICD-10-CM | POA: Insufficient documentation

## 2019-10-27 DIAGNOSIS — T148XXA Other injury of unspecified body region, initial encounter: Secondary | ICD-10-CM

## 2019-10-27 HISTORY — DX: Heart failure, unspecified: I50.9

## 2019-10-27 LAB — BASIC METABOLIC PANEL
Anion gap: 10 (ref 5–15)
BUN: 8 mg/dL (ref 8–23)
CO2: 29 mmol/L (ref 22–32)
Calcium: 8.7 mg/dL — ABNORMAL LOW (ref 8.9–10.3)
Chloride: 96 mmol/L — ABNORMAL LOW (ref 98–111)
Creatinine, Ser: 0.74 mg/dL (ref 0.44–1.00)
GFR calc Af Amer: >60 mL/min (ref >60)
GFR calc non Af Amer: >60 mL/min (ref >60)
Glucose, Bld: 196 mg/dL — ABNORMAL HIGH (ref 70–99)
Potassium: 3.5 mmol/L (ref 3.5–5.1)
Sodium: 135 mmol/L (ref 135–145)

## 2019-10-27 LAB — CBC
HCT: 44.9 % (ref 36.0–46.0)
Hemoglobin: 14 g/dL (ref 12.0–15.0)
MCH: 28.8 pg (ref 26.0–34.0)
MCHC: 31.2 g/dL (ref 30.0–36.0)
MCV: 92.4 fL (ref 80.0–100.0)
Platelets: 244 10*3/uL (ref 150–400)
RBC: 4.86 MIL/uL (ref 3.87–5.11)
RDW: 14.1 % (ref 11.5–15.5)
WBC: 13.1 10*3/uL — ABNORMAL HIGH (ref 4.0–10.5)
nRBC: 0 % (ref 0.0–0.2)

## 2019-10-27 LAB — TROPONIN I (HIGH SENSITIVITY)
Troponin I (High Sensitivity): 4 ng/L (ref <18)
Troponin I (High Sensitivity): 5 ng/L (ref <18)

## 2019-10-27 MED ORDER — SODIUM CHLORIDE 0.9% FLUSH: 3.0000 mL | Freq: Once | INTRAVENOUS | Status: DC

## 2019-10-27 MED ORDER — IBUPROFEN 400 MG PO TABS
400.0000 mg | ORAL_TABLET | Freq: Once | ORAL | Status: AC
  Administered 2019-10-27: 400 mg via ORAL
  Filled 2019-10-27: qty 1

## 2019-10-27 MED ORDER — METHOCARBAMOL 500 MG PO TABS
500.0000 mg | ORAL_TABLET | Freq: Once | ORAL | Status: AC
  Administered 2019-10-27: 500 mg via ORAL
  Filled 2019-10-27: qty 1

## 2019-10-27 NOTE — ED Triage Notes (Signed)
Patient c/o left chest pain that radiates into left shoulder and arm. Per patient pain started on Monday upon waking. Pain worse with movement of left arm. Denies any shortness of breath, nausea, vomiting, dizziness or weakness. Per patient hx of CHF.

## 2019-10-28 MED ORDER — METHOCARBAMOL 500 MG PO TABS: 500.0000 mg | ORAL_TABLET | Freq: Two times a day (BID) | ORAL | 0 refills | Status: DC | PRN

## 2019-10-28 NOTE — Discharge Instructions (Addendum)

## 2019-10-28 NOTE — ED Provider Notes (Signed)
[facility] EMERGENCY DEPARTMENT Provider Note   CSN: [id number] Arrival date & time: 10/27/19  1826     History Chief Complaint  Patient presents with  . Chest Pain    Rose Greer is a 64 y.o. female.  The history is provided by the patient.  Chest Pain Pain location:  L chest Pain quality: aching   Pain severity:  Moderate Onset quality:  Gradual Duration:  6 days Timing:  Constant Progression:  Unchanged Chronicity:  New Relieved by:  Rest Worsened by:  Coughing, deep breathing, movement and certain positions Associated symptoms: no abdominal pain, no back pain, no cough, no diaphoresis, no fever, no lower extremity edema, no numbness, no shortness of breath, no vomiting and no weakness   Patient with history of CVA, hypertension presents with left shoulder and left chest pain.  Patient reports for the past 6 days she has noted pain in her left shoulder region.  With any movement it radiates into the left chest.  Deep breathing and movement worsen the pain.  She has no other acute symptoms. No trauma is reported.  This started after waking up one morning.     Past Medical History:  Diagnosis Date  . Arthritis   . Brain aneurysm   . CHF (congestive heart failure) ([place])   . CVA (cerebral vascular accident) ([place]) 10/2016   started on Plavix ; right sided weakness  . Depression   . Essential hypertension    History of hypertensive urgency with pulmonary edema April 2018  . GERD (gastroesophageal reflux disease)   . Hyperlipidemia   . Type 2 diabetes mellitus [facility])     Patient Active Problem List   Diagnosis Date Noted  . Dysphagia 06/13/2017  . FH: colon cancer in first degree relative <51 years old 06/13/2017  . Hypertensive emergency 10/15/2016  . Right sided weakness 10/15/2016  . Brain aneurysm   . Chronic diastolic CHF (congestive heart failure) ([place]) 07/20/2016  . HTN (hypertension) 07/20/2016  . Chest pain 07/20/2016  . Family history of early CAD  07/20/2016  . Hyperlipidemia 07/20/2016  . Tobacco abuse 07/20/2016  . Acute systolic congestive heart failure ([place])   . Hypertensive urgency 07/07/2016  . Pulmonary edema 07/07/2016  . DM (diabetes mellitus) ([place]) 07/07/2016    Past Surgical History:  Procedure Laterality Date  . ABDOMINAL HYSTERECTOMY    . BACK SURGERY    . BIOPSY  07/13/2017   Procedure: BIOPSY;  Surgeon: Daneil Dolin, MD;  Location: [facility] ENDO SU[facility];  Service: Endoscopy;;  gastric biopsy   . COLONOSCOPY WITH PROPOFOL N/A 07/13/2017   Procedure: COLONOSCOPY WITH PROPOFOL;  Surgeon: Daneil Dolin, MD;  Location: [facility] ENDO [facility];  Service: Endoscopy;  Laterality: N/A;  1:15pm  . ESOPHAGOGASTRODUODENOSCOPY (EGD) WITH PROPOFOL N/A 07/13/2017   Procedure: ESOPHAGOGASTRODUODENOSCOPY (EGD) WITH PROPOFOL;  Surgeon: Daneil Dolin, MD;  Location: [facility] END[facility];  Service: Endoscopy;  Laterality: N/A;  . Venia Minks DILATION N/A 07/13/2017   Procedure: Venia Minks DILATION;  Surgeon: Daneil Dolin, MD;  Location: [facility] ENDO SUITE;  Service: Endoscopy;  Laterality: N/A;  . POLYPECTOMY  07/13/2017   Procedure: POLYPECTOMY;  Surgeon: Daneil Dolin, MD;  Location: [facility] ENDO SU[facility];  Service: Endoscopy;;   splenic flexure polyp , sigmoid colon polyp cs times 2     OB History    Gravida  3   Para  3   Term  3   Preterm      AB  Living  3     SAB      TAB      Ectopic      Multiple      Live Births              Family History  Problem Relation Age of Onset  . Diabetes Mother   . Hypertension Mother   . Colon cancer Father        diagnosed at age 54   . Lung cancer Father        thinks metastatic colon cancer  . Diabetes Sister        4 sisters ( hypertension )  . Hypertension Brother        4 brothers ( hypertension)     Social History   Tobacco Use  . Smoking status: Current Some Day Smoker    Packs/day: 0.25    Types: Cigarettes  . Smokeless tobacco: Never Used  Vaping Use  . Vaping Use: Never  used  Substance Use Topics  . Alcohol use: No  . Drug use: No    Home Medications Prior to Admission medications   Medication Sig Start Date End Date Taking? Authorizing Provider  carvedilol (COREG) 25 MG tablet Take 1 tablet (25 mg total) by mouth 2 (two) times daily. 08/01/16 07/03/17  Imogene Burn, PA-C  citalopram (CELEXA) 40 MG tablet Take 40 mg by mouth daily. 06/02/16   [provider]  clopidogrel (PLAVIX) 75 MG tablet Take 1 tablet (75 mg total) by mouth daily. 10/19/16   Annita Brod, MD  furosemide (LASIX) 40 MG tablet Take 1 tablet (40 mg total) by mouth daily. 07/09/16   Thurnell Lose, MD  hydrALAZINE (APRESOLINE) 50 MG tablet Take 1.5 tablets (75 mg total) by mouth 3 (three) times daily. 10/19/16   Annita Brod, MD  insulin detemir (LEVEMIR) 100 UNIT/ML injection Inject 0.2 mLs (20 Units total) into the skin daily. Patient taking differently: Inject 30 Units into the skin daily.  10/20/16   Annita Brod, MD  isosorbide mononitrate (IMDUR) 30 MG 24 hr tablet Take 1 tablet (30 mg total) by mouth daily. 07/09/16   Thurnell Lose, MD  lisinopril (PRINIVIL,ZESTRIL) 10 MG tablet Take 1 tablet (10 mg total) by mouth daily. 10/19/16   Annita Brod, MD  metFORMIN (GLUCOPHAGE) 500 MG tablet Take 500 mg by mouth 2 (two) times daily.     [provider]  mirtazapine (REMERON) 45 MG tablet Take 45 mg by mouth at bedtime. 06/02/16   [provider]  Na Sulfate-K Sulfate-Mg Sulf (SUPREP BOWEL PREP KIT) 17.5-3.13-1.6 GM/177ML SOLN Take 1 kit by mouth as directed. 06/13/17   Rourk, Cristopher Estimable, MD  pantoprazole (PROTONIX) 40 MG tablet TAKE 1 TABLET IN THE MORNING BEFORE BREAKFAST 08/08/19   Annitta Needs, NP  potassium chloride SA (K-DUR,KLOR-CON) 20 MEQ tablet Take 1 tablet (20 mEq total) by mouth daily. 07/20/16   Imogene Burn, PA-C  pravastatin (PRAVACHOL) 80 MG tablet Take 1 tablet (80 mg total) by mouth daily. 10/19/16   Annita Brod, MD    risperiDONE (RISPERDAL) 2 MG tablet Take 2 mg by mouth at bedtime. 06/02/16   [provider]    Allergies    Benazepril and Penicillins  Review of Systems   Review of Systems  Constitutional: Negative for diaphoresis and fever.  Respiratory: Negative for cough and shortness of breath.   Cardiovascular: Positive for chest pain.  Gastrointestinal: Negative  for abdominal pain and vomiting.  Musculoskeletal: Positive for arthralgias and myalgias. Negative for back pain.  Neurological: Negative for weakness and numbness.  All other systems reviewed and are negative.   Physical Exam Updated Vital Signs BP (!) 174/92 (BP Location: Right Arm)   Pulse 89   Temp 98.5 F (36.9 C) (Oral)   Resp 18   Ht 1.6 m (5' 3" )   Wt 64 kg   SpO2 95%   BMI 24.98 kg/m   Physical Exam CONSTITUTIONAL: Well developed/well nourished, resting comfortably when I enter the room HEAD: Normocephalic/atraumatic EYES: EOMI/PERRL ENMT: Mucous membranes moist, no facial edema noted NECK: supple no meningeal signs SPINE/BACK:entire spine nontender CV: S1/S2 noted, no murmurs/rubs/gallops noted LUNGS: Lungs are clear to auscultation bilaterally, no apparent distress ABDOMEN: soft, nontender, no rebound or guarding, bowel sounds noted throughout abdomen GU:no cva tenderness NEURO: Pt is awake/alert/appropriate, moves all extremitiesx4.  No facial droop.   EXTREMITIES: pulses normal/equalx4, full ROM, tenderness noted to left trapezius region.  Tenderness noted in the left chest/clavicle.  No crepitus.  No edema noted to either upper extremity.  No erythema noted extremities SKIN: warm, color normal PSYCH: no abnormalities of mood noted, alert and oriented to situation  ED Results / Procedures / Treatments   Labs (all labs ordered are listed, but only abnormal results are displayed) Labs Reviewed  BASIC METABOLIC PANEL - Abnormal; Notable for the following components:      Result Value   Chloride  96 (*)    Glucose, Bld 196 (*)    Calcium 8.7 (*)    All other components within normal limits  CBC - Abnormal; Notable for the following components:   WBC 13.1 (*)    All other components within normal limits  TROPONIN I (HIGH SENSITIVITY)  TROPONIN I (HIGH SENSITIVITY)    EKG EKG Interpretation  Date/Time:  Sunday October 27 2019 19:04:35 EDT Ventricular Rate:  87 PR Interval:  148 QRS Duration: 64 QT Interval:  396 QTC Calculation: 476 R Axis:   50 Text Interpretation: Normal sinus rhythm Normal ECG Interpretation limited secondary to artifact Confirmed by Ripley Fraise [phone]) on 10/27/2019 11:06:55 PM   Radiology DG Chest 2 View  Result Date: 10/27/2019 CLINICAL DATA:  Chest pain EXAM: CHEST - 2 VIEW COMPARISON:  08/04/2018 FINDINGS: Minimal left basilar atelectasis or scarring. The lungs are otherwise clear. No pneumothorax or pleural effusion. Cardiac size within normal limits. The pulmonary vascularity is normal. No acute bone abnormality. IMPRESSION: No active cardiopulmonary disease. Electronically Signed   By: Fidela Salisbury MD   On: 10/27/2019 20:02    Procedures Procedures   Medications Ordered in ED Medications  sodium chloride flush (NS) 0.9 % injection 3 mL (3 mLs Intravenous Not Given 10/27/19 2316)  methocarbamol (ROBAXIN) tablet 500 mg (500 mg Oral Given 10/27/19 2324)  ibuprofen (ADVIL) tablet 400 mg (400 mg Oral Given 10/27/19 2324)    ED Course  I have reviewed the triage vital signs and the nursing notes.  Pertinent labs & imaging results that were available during my care of the patient were reviewed by me and considered in my medical decision making (see chart for details).    MDM Rules/Calculators/A&P                          Patient is clearly having pain with any movement or palpation of the trapezius or chest/clavicle region Her EKG is unchanged.  Her labs and imaging  are unremarkable.  Strong suspicion for musculoskeletal cause. 12:38 AM Pt  feels improved after meds with improved ROM of left arm Will d/c home  Final Clinical Impression(s) / ED Diagnoses Final diagnoses:  Precordial pain  Muscle strain    Rx / DC Orders ED Discharge Orders         Ordered    methocarbamol (ROBAXIN) 500 MG tablet  2 times daily PRN     Discontinue  Reprint     10/28/19 0038           Ripley Fraise, MD 10/28/19 [phone]

## 2019-12-04 ENCOUNTER — Other Ambulatory Visit: Payer: Self-pay | Admitting: Gastroenterology

## 2019-12-05 ENCOUNTER — Encounter: Payer: Self-pay | Admitting: Internal Medicine

## 2019-12-05 NOTE — Telephone Encounter (Signed)
Sent patient a letter that she needed to call and make appointment

## 2019-12-05 NOTE — Telephone Encounter (Signed)
Last seen over two years ago. Needs ov.

## 2019-12-06 ENCOUNTER — Other Ambulatory Visit: Payer: Self-pay | Admitting: Gastroenterology

## 2019-12-11 ENCOUNTER — Telehealth: Payer: Self-pay

## 2019-12-11 DIAGNOSIS — R1310 Dysphagia, unspecified: Secondary | ICD-10-CM

## 2019-12-11 DIAGNOSIS — K219 Gastro-esophageal reflux disease without esophagitis: Secondary | ICD-10-CM

## 2019-12-11 NOTE — Telephone Encounter (Signed)
Refill request received from [facility]  for Pantoprazole 40 mg one capsule 30 mins before breakfast.

## 2019-12-14 ENCOUNTER — Other Ambulatory Visit: Payer: Self-pay

## 2019-12-14 ENCOUNTER — Emergency Department (HOSPITAL_COMMUNITY): Payer: Medicare Other

## 2019-12-14 ENCOUNTER — Encounter (HOSPITAL_COMMUNITY): Payer: Self-pay | Admitting: Emergency Medicine

## 2019-12-14 ENCOUNTER — Emergency Department (HOSPITAL_COMMUNITY)
Admission: EM | Admit: 2019-12-14 | Discharge: 2019-12-14 | Disposition: A | Discharge status: Home / Self Care | Payer: Medicare Other | Attending: Emergency Medicine | Admitting: Emergency Medicine

## 2019-12-14 DIAGNOSIS — I110 Hypertensive heart disease with heart failure: Secondary | ICD-10-CM | POA: Insufficient documentation

## 2019-12-14 DIAGNOSIS — E119 Type 2 diabetes mellitus without complications: Secondary | ICD-10-CM | POA: Diagnosis not present

## 2019-12-14 DIAGNOSIS — Z7984 Long term (current) use of oral hypoglycemic drugs: Secondary | ICD-10-CM | POA: Diagnosis not present

## 2019-12-14 DIAGNOSIS — Z79899 Other long term (current) drug therapy: Secondary | ICD-10-CM | POA: Insufficient documentation

## 2019-12-14 DIAGNOSIS — R630 Anorexia: Secondary | ICD-10-CM | POA: Diagnosis present

## 2019-12-14 DIAGNOSIS — I509 Heart failure, unspecified: Secondary | ICD-10-CM | POA: Diagnosis not present

## 2019-12-14 DIAGNOSIS — F17210 Nicotine dependence, cigarettes, uncomplicated: Secondary | ICD-10-CM | POA: Diagnosis not present

## 2019-12-14 LAB — COMPREHENSIVE METABOLIC PANEL
ALT: 10 U/L (ref 0–44)
AST: 13 U/L — ABNORMAL LOW (ref 15–41)
Albumin: 4.4 g/dL (ref 3.5–5.0)
Alkaline Phosphatase: 56 U/L (ref 38–126)
Anion gap: 13 (ref 5–15)
BUN: 10 mg/dL (ref 8–23)
CO2: 25 mmol/L (ref 22–32)
Calcium: 9.5 mg/dL (ref 8.9–10.3)
Chloride: 97 mmol/L — ABNORMAL LOW (ref 98–111)
Creatinine, Ser: 0.62 mg/dL (ref 0.44–1.00)
GFR calc Af Amer: >60 mL/min (ref >60)
GFR calc non Af Amer: >60 mL/min (ref >60)
Glucose, Bld: 192 mg/dL — ABNORMAL HIGH (ref 70–99)
Potassium: 4 mmol/L (ref 3.5–5.1)
Sodium: 135 mmol/L (ref 135–145)
Total Bilirubin: 0.4 mg/dL (ref 0.3–1.2)
Total Protein: 8.3 g/dL — ABNORMAL HIGH (ref 6.5–8.1)

## 2019-12-14 LAB — DIFFERENTIAL
Abs Immature Granulocytes: 0.03 10*3/uL (ref 0.00–0.07)
Basophils Absolute: 0.1 10*3/uL (ref 0.0–0.1)
Basophils Relative: 1 %
Eosinophils Absolute: 0.2 10*3/uL (ref 0.0–0.5)
Eosinophils Relative: 2 %
Immature Granulocytes: 0 %
Lymphocytes Relative: 47 %
Lymphs Abs: 5.4 10*3/uL — ABNORMAL HIGH (ref 0.7–4.0)
Monocytes Absolute: 0.8 10*3/uL (ref 0.1–1.0)
Monocytes Relative: 7 %
Neutro Abs: 4.9 10*3/uL (ref 1.7–7.7)
Neutrophils Relative %: 43 %

## 2019-12-14 LAB — CBC
HCT: 43.9 % (ref 36.0–46.0)
Hemoglobin: 14 g/dL (ref 12.0–15.0)
MCH: 28.6 pg (ref 26.0–34.0)
MCHC: 31.9 g/dL (ref 30.0–36.0)
MCV: 89.8 fL (ref 80.0–100.0)
Platelets: 218 10*3/uL (ref 150–400)
RBC: 4.89 MIL/uL (ref 3.87–5.11)
RDW: 13.3 % (ref 11.5–15.5)
WBC: 11.7 10*3/uL — ABNORMAL HIGH (ref 4.0–10.5)
nRBC: 0 % (ref 0.0–0.2)

## 2019-12-14 LAB — TSH: TSH: 1.512 u[IU]/mL (ref 0.350–4.500)

## 2019-12-14 MED ORDER — SODIUM CHLORIDE 0.9 % IV BOLUS
1000.0000 mL | Freq: Once | INTRAVENOUS | Status: AC
  Administered 2019-12-14: 1000 mL via INTRAVENOUS

## 2019-12-14 NOTE — ED Triage Notes (Signed)
Patient c/o loss of appetite x2 months. Patient denies any nausea, vomiting, or diarrhea but reports generalized weakness. Per patient no exposure to COVID that she is aware of. Per patient still has sense of smell and taste. Patient reports weight loss of 13lbs in past month.

## 2019-12-14 NOTE — Discharge Instructions (Addendum)
Make sure to adhere to a well balanced diet and make sure you are getting an adequate amount of protein in your diet as well.   Please follow up with your primary care provider within 5-7 days for re-evaluation of your symptoms. If you do not have a primary care provider, information for a healthcare clinic has been provided for you to make arrangements for follow up care. Please return to the emergency department for any new or worsening symptoms.

## 2019-12-14 NOTE — ED Provider Notes (Signed)
[facility] EMERGENCY DEPARTMENT Provider Note   CSN: [id number] Arrival date & time: 12/14/19  1118     History Chief Complaint  Patient presents with  . Anorexia    Rose Greer is a 64 y.o. female.  HPI   64 year old female with a history of brain aneurysm, CHF, CVA, depression, essential hypertension, GERD, hyperlipidemia, diabetes, who presents the emergency department today for evaluation of decreased appetite.  Patient states she has not had an appetite for at least the last month.  States she has not eaten anything in 2 days.  She denies any nausea, vomiting, diarrhea, constipation.  Last BM was yesterday.  Denies any fevers, chills, urinary symptoms.  Denies chest pain, shortness of breath, cough or hemoptysis.  States she has lost about 13 pounds in the last month.  Her family member is at bedside and states that ever since she started her new insulin she has been feeling poorly he thinks her insulin is too strong.  Patient denies feeling any symptoms of depression.  Patient notes that she has an appoint with hematology on 12/24/2019.  States she was referred there by her PCP because there was something abnormal with her "red blood cells ".  She does not have any further information about why she is being referred to hematology. I am unable to review labs from PCP in chart.   States she had a colonoscopy last year which she states was normal.   Past Medical History:  Diagnosis Date  . Arthritis   . Brain aneurysm   . CHF (congestive heart failure) ([place])   . CVA (cerebral vascular accident) ([place]) 10/2016   started on Plavix ; right sided weakness  . Depression   . Essential hypertension    History of hypertensive urgency with pulmonary edema April 2018  . GERD (gastroesophageal reflux disease)   . Hyperlipidemia   . Type 2 diabetes mellitus [facility])     Patient Active Problem List   Diagnosis Date Noted  . Dysphagia 06/13/2017  . FH: colon cancer in first degree  relative <33 years old 06/13/2017  . Hypertensive emergency 10/15/2016  . Right sided weakness 10/15/2016  . Brain aneurysm   . Chronic diastolic CHF (congestive heart failure) ([place]) 07/20/2016  . HTN (hypertension) 07/20/2016  . Chest pain 07/20/2016  . Family history of early CAD 07/20/2016  . Hyperlipidemia 07/20/2016  . Tobacco abuse 07/20/2016  . Acute systolic congestive heart failure ([place])   . Hypertensive urgency 07/07/2016  . Pulmonary edema 07/07/2016  . DM (diabetes mellitus) ([place]) 07/07/2016    Past Surgical History:  Procedure Laterality Date  . ABDOMINAL HYSTERECTOMY    . BACK SURGERY    . BIOPSY  07/13/2017   Procedure: BIOPSY;  Surgeon: Daneil Dolin, MD;  Location[facility];  Service: Endoscopy;;  gastric biopsy   . COLONOSCOPY WITH PROPOFOL N/A 07/13/2017   Procedure: COLONOSCOPY WITH PROPOFOL;  Surgeon: Daneil Dolin, MD;  Location: [facility];  Service: Endoscopy;  Laterality: N/A;  1:15pm  . ESOPHAGOGASTRODUODENOSCOPY (EGD) WITH PROPOFOL N/A 07/13/2017   Procedure: ESOPHAGOGASTRODUODENOSCOPY (EGD) WITH PROPOFOL;  Surgeon: Daneil Dolin, MD;  [facility];  Service: Endoscopy;  Laterality: N/A;  . Venia Minks DILATION N/A 07/13/2017   Procedure: Venia Minks DILATION;  Surgeon: Daneil Dolin, MD;  Location: [facility];  Service: Endoscopy;  Laterality: N/A;  . POLYPECTOMY  07/13/2017   Procedure: POLYPECTOMY;  Surgeon: Daneil Dolin, MD;  [facility];  Service: Endoscopy;;   splenic flexure polyp , sigmoid colon polyp cs times 2     OB History    Gravida  3   Para  3   Term  3   Preterm      AB      Living  3     SAB      TAB      Ectopic      Multiple      Live Births              Family History  Problem Relation Age of Onset  . Diabetes Mother   . Hypertension Mother   . Colon cancer Father        diagnosed at age 72   . Lung cancer Father        thinks metastatic colon cancer  . Diabetes Sister          4 sisters ( hypertension )  . Hypertension Brother        4 brothers ( hypertension)     Social History   Tobacco Use  . Smoking status: Current Some Day Smoker    Packs/day: 0.25    Types: Cigarettes  . Smokeless tobacco: Never Used  Vaping Use  . Vaping Use: Never used  Substance Use Topics  . Alcohol use: No  . Drug use: No    Home Medications Prior to Admission medications   Medication Sig Start Date End Date Taking? Authorizing Provider  carvedilol (COREG) 25 MG tablet Take 1 tablet (25 mg total) by mouth 2 (two) times daily. 08/01/16 07/03/17  Imogene Burn, PA-C  citalopram (CELEXA) 40 MG tablet Take 40 mg by mouth daily. 06/02/16   [provider]  clopidogrel (PLAVIX) 75 MG tablet Take 1 tablet (75 mg total) by mouth daily. 10/19/16   Annita Brod, MD  furosemide (LASIX) 40 MG tablet Take 1 tablet (40 mg total) by mouth daily. 07/09/16   Thurnell Lose, MD  hydrALAZINE (APRESOLINE) 50 MG tablet Take 1.5 tablets (75 mg total) by mouth 3 (three) times daily. 10/19/16   Annita Brod, MD  insulin detemir (LEVEMIR) 100 UNIT/ML injection Inject 0.2 mLs (20 Units total) into the skin daily. Patient taking differently: Inject 30 Units into the skin daily.  10/20/16   Annita Brod, MD  isosorbide mononitrate (IMDUR) 30 MG 24 hr tablet Take 1 tablet (30 mg total) by mouth daily. 07/09/16   Thurnell Lose, MD  lisinopril (PRINIVIL,ZESTRIL) 10 MG tablet Take 1 tablet (10 mg total) by mouth daily. 10/19/16   Annita Brod, MD  metFORMIN (GLUCOPHAGE) 500 MG tablet Take 500 mg by mouth 2 (two) times daily.     [provider]  methocarbamol (ROBAXIN) 500 MG tablet Take 1 tablet (500 mg total) by mouth 2 (two) times daily as needed for muscle spasms. 10/28/19   Ripley Fraise, MD  mirtazapine (REMERON) 45 MG tablet Take 45 mg by mouth at bedtime. 06/02/16   [provider]  Na Sulfate-K Sulfate-Mg Sulf (SUPREP BOWEL PREP KIT)  17.5-3.13-1.6 GM/177ML SOLN Take 1 kit by mouth as directed. 06/13/17   Rourk, Cristopher Estimable, MD  pantoprazole (PROTONIX) 40 MG tablet TAKE 1 TABLET IN THE MORNING BEFORE BREAKFAST 08/08/19   Annitta Needs, NP  potassium chloride SA (K-DUR,KLOR-CON) 20 MEQ tablet Take 1 tablet (20 mEq total) by mouth daily. 07/20/16   Imogene Burn, PA-C  pravastatin (PRAVACHOL) 80 MG tablet Take  1 tablet (80 mg total) by mouth daily. 10/19/16   Annita Brod, MD  risperiDONE (RISPERDAL) 2 MG tablet Take 2 mg by mouth at bedtime. 06/02/16   [provider]    Allergies    Benazepril and Penicillins  Review of Systems   Review of Systems  Constitutional: Positive for appetite change and unexpected weight change. Negative for chills and fever.  HENT: Negative for ear pain and sore throat.   Eyes: Negative for visual disturbance.  Respiratory: Negative for cough and shortness of breath.   Cardiovascular: Negative for chest pain.  Gastrointestinal: Negative for abdominal pain, constipation, diarrhea, nausea and vomiting.  Genitourinary: Negative for dysuria and hematuria.  Musculoskeletal: Negative for back pain.  Skin: Negative for rash.  Neurological: Negative for headaches.  All other systems reviewed and are negative.   Physical Exam Updated Vital Signs BP (!) 168/89 (BP Location: Right Arm)   Pulse 72   Temp 99 F (37.2 C) (Oral)   Resp 16   Ht 5' 3"  (1.6 m)   Wt 58.1 kg   SpO2 100%   BMI 22.67 kg/m   Physical Exam Vitals and nursing note reviewed.  Constitutional:      General: She is not in acute distress.    Appearance: She is well-developed.  HENT:     Head: Normocephalic and atraumatic.     Mouth/Throat:     Mouth: Mucous membranes are dry.  Eyes:     Conjunctiva/sclera: Conjunctivae normal.  Cardiovascular:     Rate and Rhythm: Normal rate and regular rhythm.     Heart sounds: Normal heart sounds. No murmur heard.   Pulmonary:     Effort: Pulmonary effort is normal.  No respiratory distress.     Breath sounds: Normal breath sounds. No wheezing, rhonchi or rales.  Abdominal:     General: Bowel sounds are normal.     Palpations: Abdomen is soft.     Tenderness: There is no abdominal tenderness. There is no guarding or rebound.  Musculoskeletal:     Cervical back: Neck supple.  Skin:    General: Skin is warm and dry.  Neurological:     Mental Status: She is alert.     Comments: Clear speech, no facial droop, moving all extremities purposefully     ED Results / Procedures / Treatments   Labs (all labs ordered are listed, but only abnormal results are displayed) Labs Reviewed  CBC - Abnormal; Notable for the following components:      Result Value   WBC 11.7 (*)    All other components within normal limits  COMPREHENSIVE METABOLIC PANEL - Abnormal; Notable for the following components:   Chloride 97 (*)    Glucose, Bld 192 (*)    Total Protein 8.3 (*)    AST 13 (*)    All other components within normal limits  DIFFERENTIAL - Abnormal; Notable for the following components:   Lymphs Abs 5.4 (*)    All other components within normal limits  TSH  URINALYSIS, ROUTINE W REFLEX MICROSCOPIC    EKG None  Radiology DG Chest 2 View  Result Date: 12/14/2019 CLINICAL DATA:  64 year old presenting with a two-month history of anorexia and generalized weakness. Current smoker with current history of hypertension, CHF and diabetes. EXAM: CHEST - 2 VIEW COMPARISON:  10/27/2019 and earlier, including CTA chest 08/04/2018. FINDINGS: Cardiac silhouette normal in size, unchanged. Thoracic aorta mildly tortuous and atherosclerotic, unchanged. Hilar and mediastinal contours otherwise unremarkable. Mild  central peribronchial thickening, unchanged. Lungs otherwise clear. Pulmonary vascularity normal. No visible pleural effusions. No pneumothorax. Emphysematous changes identified on the prior CT are less conspicuous on the chest x-ray. Mild degenerative changes  involving the thoracic spine. No significant interval change. IMPRESSION: 1. No acute cardiopulmonary disease. 2. Stable mild changes of chronic bronchitis and/or asthma. Electronically Signed   By: Evangeline Dakin M.D.   On: 12/14/2019 14:21    Procedures Procedures (including critical care time)  Medications Ordered in ED Medications  sodium chloride 0.9 % bolus 1,000 mL (1,000 mLs Intravenous New Bag/Given 12/14/19 1647)    ED Course  I have reviewed the triage vital signs and the nursing notes.  Pertinent labs & imaging results that were available during my care of the patient were reviewed by me and considered in my medical decision making (see chart for details).    MDM Rules/Calculators/A&P                          64 year old female presenting for evaluation of decreased appetite and weight loss of 13 pounds over the last month.  She denies any other associated systemic symptoms.  Reviewed/interpreted labs CBC is without cytosis or anemia CMP with normal electrolytes.  Normal kidney and liver function. TSH wnl UA w/o evidence of infection  CXR reviewed/interpreted - 1. No acute cardiopulmonary disease. 2. Stable mild changes of chronic bronchitis and/or asthma.  Pt was given IVF and was able to tolerate peanut butter crackers in the ED. Discussed w/u and plan for discharge with close pcp f/u. Advised on return precautions. She voices understanding of the plan and reasons to return. All questions answered, pt stable for discharge.   Pt seen in conjunction with Dr. Gilford Raid who personally evaluated the pt and is in agreement with plan.   Final Clinical Impression(s) / ED Diagnoses Final diagnoses:  Decreased appetite    Rx / DC Orders ED Discharge Orders    None       Bishop Dublin 12/14/19 1712    Isla Pence, MD 12/14/19 1727

## 2019-12-14 NOTE — ED Notes (Signed)
Labs obtained by phlebotomy prior to being moved to room.

## 2019-12-16 MED ORDER — PANTOPRAZOLE SODIUM 40 MG PO TBEC: 40.0000 mg | DELAYED_RELEASE_TABLET | Freq: Every day | ORAL | 5 refills | Status: DC

## 2019-12-16 NOTE — Addendum Note (Signed)
Addended by: Gordy Levan, Teriann Livingood A on: 12/16/2019 07:53 PM   Modules accepted: Orders

## 2019-12-24 ENCOUNTER — Inpatient Hospital Stay (HOSPITAL_COMMUNITY): Payer: Medicare Other | Admitting: Hematology

## 2019-12-25 ENCOUNTER — Other Ambulatory Visit (HOSPITAL_COMMUNITY): Payer: Self-pay | Admitting: Internal Medicine

## 2019-12-25 ENCOUNTER — Other Ambulatory Visit: Payer: Self-pay | Admitting: Internal Medicine

## 2019-12-25 DIAGNOSIS — R61 Generalized hyperhidrosis: Secondary | ICD-10-CM

## 2019-12-25 DIAGNOSIS — R630 Anorexia: Secondary | ICD-10-CM

## 2019-12-25 DIAGNOSIS — R634 Abnormal weight loss: Secondary | ICD-10-CM

## 2019-12-26 ENCOUNTER — Other Ambulatory Visit (HOSPITAL_COMMUNITY): Payer: Self-pay | Admitting: Internal Medicine

## 2019-12-26 ENCOUNTER — Other Ambulatory Visit: Payer: Self-pay | Admitting: Internal Medicine

## 2019-12-26 DIAGNOSIS — R61 Generalized hyperhidrosis: Secondary | ICD-10-CM

## 2019-12-26 DIAGNOSIS — R630 Anorexia: Secondary | ICD-10-CM

## 2019-12-26 DIAGNOSIS — R634 Abnormal weight loss: Secondary | ICD-10-CM

## 2019-12-31 ENCOUNTER — Other Ambulatory Visit: Payer: Self-pay | Admitting: Internal Medicine

## 2019-12-31 ENCOUNTER — Other Ambulatory Visit (HOSPITAL_COMMUNITY): Payer: Self-pay | Admitting: Internal Medicine

## 2019-12-31 DIAGNOSIS — E119 Type 2 diabetes mellitus without complications: Secondary | ICD-10-CM

## 2019-12-31 DIAGNOSIS — R61 Generalized hyperhidrosis: Secondary | ICD-10-CM

## 2019-12-31 DIAGNOSIS — R634 Abnormal weight loss: Secondary | ICD-10-CM

## 2019-12-31 DIAGNOSIS — Z8619 Personal history of other infectious and parasitic diseases: Secondary | ICD-10-CM

## 2019-12-31 DIAGNOSIS — F17200 Nicotine dependence, unspecified, uncomplicated: Secondary | ICD-10-CM

## 2019-12-31 DIAGNOSIS — R630 Anorexia: Secondary | ICD-10-CM

## 2020-02-03 ENCOUNTER — Encounter (HOSPITAL_COMMUNITY): Payer: Self-pay

## 2020-02-03 ENCOUNTER — Other Ambulatory Visit: Payer: Self-pay

## 2020-02-03 ENCOUNTER — Inpatient Hospital Stay (HOSPITAL_COMMUNITY): Payer: Medicare Other

## 2020-02-03 ENCOUNTER — Other Ambulatory Visit (HOSPITAL_COMMUNITY): Payer: Self-pay

## 2020-02-03 ENCOUNTER — Inpatient Hospital Stay (HOSPITAL_COMMUNITY): Payer: Medicare Other | Attending: Hematology | Admitting: Hematology

## 2020-02-03 VITALS — BP 148/92 | HR 84 | Temp 97.1°F | Resp 16 | Ht 63.0 in | Wt 118.3 lb

## 2020-02-03 DIAGNOSIS — D72829 Elevated white blood cell count, unspecified: Secondary | ICD-10-CM | POA: Diagnosis present

## 2020-02-03 DIAGNOSIS — J811 Chronic pulmonary edema: Secondary | ICD-10-CM | POA: Diagnosis not present

## 2020-02-03 DIAGNOSIS — E119 Type 2 diabetes mellitus without complications: Secondary | ICD-10-CM | POA: Diagnosis not present

## 2020-02-03 DIAGNOSIS — I509 Heart failure, unspecified: Secondary | ICD-10-CM | POA: Insufficient documentation

## 2020-02-03 DIAGNOSIS — Z801 Family history of malignant neoplasm of trachea, bronchus and lung: Secondary | ICD-10-CM | POA: Diagnosis not present

## 2020-02-03 DIAGNOSIS — R634 Abnormal weight loss: Secondary | ICD-10-CM | POA: Insufficient documentation

## 2020-02-03 DIAGNOSIS — I10 Essential (primary) hypertension: Secondary | ICD-10-CM | POA: Diagnosis not present

## 2020-02-03 DIAGNOSIS — E785 Hyperlipidemia, unspecified: Secondary | ICD-10-CM | POA: Diagnosis not present

## 2020-02-03 LAB — CBC WITH DIFFERENTIAL/PLATELET
Abs Immature Granulocytes: 0.02 10*3/uL (ref 0.00–0.07)
Basophils Absolute: 0 10*3/uL (ref 0.0–0.1)
Basophils Relative: 0 %
Eosinophils Absolute: 0.1 10*3/uL (ref 0.0–0.5)
Eosinophils Relative: 1 %
HCT: 43 % (ref 36.0–46.0)
Hemoglobin: 13.7 g/dL (ref 12.0–15.0)
Immature Granulocytes: 0 %
Lymphocytes Relative: 50 %
Lymphs Abs: 4.7 10*3/uL — ABNORMAL HIGH (ref 0.7–4.0)
MCH: 28.1 pg (ref 26.0–34.0)
MCHC: 31.9 g/dL (ref 30.0–36.0)
MCV: 88.1 fL (ref 80.0–100.0)
Monocytes Absolute: 0.5 10*3/uL (ref 0.1–1.0)
Monocytes Relative: 6 %
Neutro Abs: 4.1 10*3/uL (ref 1.7–7.7)
Neutrophils Relative %: 43 %
Platelets: 199 10*3/uL (ref 150–400)
RBC: 4.88 MIL/uL (ref 3.87–5.11)
RDW: 13.8 % (ref 11.5–15.5)
WBC: 9.5 10*3/uL (ref 4.0–10.5)
nRBC: 0 % (ref 0.0–0.2)

## 2020-02-03 LAB — COMPREHENSIVE METABOLIC PANEL
ALT: 19 U/L (ref 0–44)
AST: 19 U/L (ref 15–41)
Albumin: 4 g/dL (ref 3.5–5.0)
Alkaline Phosphatase: 52 U/L (ref 38–126)
Anion gap: 10 (ref 5–15)
BUN: 6 mg/dL — ABNORMAL LOW (ref 8–23)
CO2: 25 mmol/L (ref 22–32)
Calcium: 9.2 mg/dL (ref 8.9–10.3)
Chloride: 98 mmol/L (ref 98–111)
Creatinine, Ser: 0.59 mg/dL (ref 0.44–1.00)
GFR, Estimated: >60 mL/min (ref >60)
Glucose, Bld: 117 mg/dL — ABNORMAL HIGH (ref 70–99)
Potassium: 2.7 mmol/L — CL (ref 3.5–5.1)
Sodium: 133 mmol/L — ABNORMAL LOW (ref 135–145)
Total Bilirubin: 0.6 mg/dL (ref 0.3–1.2)
Total Protein: 7.6 g/dL (ref 6.5–8.1)

## 2020-02-03 LAB — LACTATE DEHYDROGENASE: LDH: 126 U/L (ref 98–192)

## 2020-02-03 MED ORDER — POTASSIUM CHLORIDE CRYS ER 20 MEQ PO TBCR: 20.0000 meq | EXTENDED_RELEASE_TABLET | Freq: Once | ORAL | 0 refills | Status: DC

## 2020-02-03 NOTE — Progress Notes (Signed)
CRITICAL VALUE ALERT  Critical Value:  K+ 2.7  Date & Time Notied:  02/03/2020 at [place]  Provider Notified: Dr. Delton Coombes  Orders Received/Actions taken:

## 2020-02-03 NOTE — Progress Notes (Signed)
CONSULT NOTE  Patient Care Team: Abran Richard, MD as PCP - General (Internal Medicine) Teena Irani, PTA as Physical Therapy Assistant (Physical Therapy) Leeroy Cha, PT as Physical Therapist (Physical Therapy) Gala Romney Cristopher Estimable, MD as Consulting Physician (Gastroenterology)  CHIEF COMPLAINTS/PURPOSE OF CONSULTATION: leukcytosis  HISTORY OF PRESENTING ILLNESS:  Rose Greer 64 y.o. female is here because of elevated white counts that have been present for at least 3 months.  Noted to have a normal white count 2 years ago.  Most recent white count was 11.3 with mostly lymphocytes.  She has a past medical history significant for hypertension, congestive heart failure, brain aneurysm, pulmonary edema, dysphagia, diabetes, hyperlipidemia and tobacco abuse.  She was recently evaluated at [facility] ED on 01/04/20 for decreased p.o. intake, unintentional weight loss for the past 2 months and weakness.  She had a CT chest abdomen pelvis to evaluate for malignancy which was negative however there was some enhancement of the liver.  Radiology recommended outpatient MRI.  She was given IV fluids and sent home.   Today she states she is feeling fair.  Recently had CT chest abdomen pelvis which revealed a normal spleen.  She denies any recent infection or inflammation.  She quit smoking about 3 months ago.  She is not obese.  Her BMI is around 21.  She admits to 30 lb weight loss over the past few months.  She denies any autoimmune disorders or any history of blood disorders.  Her father died of lung cancer and her brother has lupus.  Denies any joint pain or connective tissue disorder.  Has night sweats but denies fevers.  Has no appetite.  Admits to early satiety.  She had a stroke 2 years ago and has residual right-sided weakness.  Denies any pain.  Denies any nausea or vomiting.  Has normal bowel movements.  Denies any abdominal pain.  Denies any respiratory concerns.    MEDICAL HISTORY:  Past  Medical History:  Diagnosis Date  . Arthritis   . Brain aneurysm   . CHF (congestive heart failure) ([place])   . CVA (cerebral vascular accident) ([place]) 10/2016   started on Plavix ; right sided weakness  . Depression   . Essential hypertension    History of hypertensive urgency with pulmonary edema April 2018  . GERD (gastroesophageal reflux disease)   . Hyperlipidemia   . Type 2 diabetes mellitus ([place])     SURGICAL HISTORY: Past Surgical History:  Procedure Laterality Date  . ABDOMINAL HYSTERECTOMY    . BACK SURGERY    . BIOPSY  07/13/2017   Procedure: BIOPSY;  Surgeon: Daneil Dolin, MD;  [facility];  Service: Endoscopy;;  gastric biopsy   . COLONOSCOPY WITH PROPOFOL N/A 07/13/2017   Procedure: COLONOSCOPY WITH PROPOFOL;  Surgeon: Daneil Dolin, MD;  Location[facility];  Service: Endoscopy;  Laterality: N/A;  1:15pm  . ESOPHAGOGASTRODUODENOSCOPY (EGD) WITH PROPOFOL N/A 07/13/2017   Procedure: ESOPHAGOGASTRODUODENOSCOPY (EGD) WITH PROPOFOL;  Surgeon: Daneil Dolin, MD;  [facility];  Service: Endoscopy;  Laterality: N/A;  . Venia Minks DILATION N/A 07/13/2017   Procedure: Venia Minks DILATION;  Surgeon: Daneil Dolin, MD;  [facility];  Service: Endoscopy;  Laterality: N/A;  . POLYPECTOMY  07/13/2017   Procedure: POLYPECTOMY;  Surgeon: Daneil Dolin, MD;  [facility];  Service: Endoscopy;;   splenic flexure polyp , sigmoid colon polyp cs times 2    SOCIAL HISTORY: Social History  Socioeconomic History  . Marital status: Single    Spouse name: Not on file  . Number of children: Not on file  . Years of education: Not on file  . Highest education level: Not on file  Occupational History  . Not on file  Tobacco Use  . Smoking status: Current Some Day Smoker    Packs/day: 0.25    Types: Cigarettes  . Smokeless tobacco: Never Used  Vaping Use  . Vaping Use: Never used  Substance and Sexual Activity  . Alcohol use: No  .  Drug use: No  . Sexual activity: Yes    Birth control/protection: Surgical  Other Topics Concern  . Not on file  Social History Narrative  . Not on file   Social Determinants of Health   Financial Resource Strain:   . Difficulty of Paying Living Expenses: Not on file  Food Insecurity:   . Worried About Charity fundraiser in the Last Year: Not on file  . Ran Out of Food in the Last Year: Not on file  Transportation Needs:   . Lack of Transportation (Medical): Not on file  . Lack of Transportation (Non-Medical): Not on file  Physical Activity:   . Days of Exercise per Week: Not on file  . Minutes of Exercise per Session: Not on file  Stress:   . Feeling of Stress : Not on file  Social Connections:   . Frequency of Communication with Friends and Family: Not on file  . Frequency of Social Gatherings with Friends and Family: Not on file  . Attends Religious Services: Not on file  . Active Member of Clubs or Organizations: Not on file  . Attends Archivist Meetings: Not on file  . Marital Status: Not on file  Intimate Partner Violence:   . Fear of Current or Ex-Partner: Not on file  . Emotionally Abused: Not on file  . Physically Abused: Not on file  . Sexually Abused: Not on file    FAMILY HISTORY: Family History  Problem Relation Age of Onset  . Diabetes Mother   . Hypertension Mother   . Colon cancer Father        diagnosed at age 80   . Lung cancer Father        thinks metastatic colon cancer  . Diabetes Sister        4 sisters ( hypertension )  . Hypertension Brother        4 brothers ( hypertension)     ALLERGIES:  is allergic to benazepril and penicillins.  MEDICATIONS:  Current Outpatient Medications  Medication Sig Dispense Refill  . carvedilol (COREG) 6.25 MG tablet Take 6.25 mg by mouth 2 (two) times daily.    . citalopram (CELEXA) 40 MG tablet Take 40 mg by mouth daily.    . clopidogrel (PLAVIX) 75 MG tablet Take 1 tablet (75 mg total) by  mouth daily. 30 tablet 2  . ENTRESTO 24-26 MG Take 1 tablet by mouth 2 (two) times daily.    . furosemide (LASIX) 40 MG tablet Take 1 tablet (40 mg total) by mouth daily. 30 tablet 0  . hydrALAZINE (APRESOLINE) 50 MG tablet Take 1.5 tablets (75 mg total) by mouth 3 (three) times daily. 135 tablet 2  . insulin detemir (LEVEMIR) 100 UNIT/ML injection Inject 0.2 mLs (20 Units total) into the skin daily. (Patient taking differently: Inject 30 Units into the skin daily. ) 10 mL 11  . isosorbide dinitrate (ISORDIL) 10 MG  tablet Take 10 mg by mouth 3 (three) times daily.    . isosorbide mononitrate (IMDUR) 30 MG 24 hr tablet Take 1 tablet (30 mg total) by mouth daily. 30 tablet 0  . lisinopril (PRINIVIL,ZESTRIL) 10 MG tablet Take 1 tablet (10 mg total) by mouth daily. 30 tablet 2  . metFORMIN (GLUCOPHAGE) 500 MG tablet Take 500 mg by mouth 2 (two) times daily.     . methocarbamol (ROBAXIN) 500 MG tablet Take 1 tablet (500 mg total) by mouth 2 (two) times daily as needed for muscle spasms. 20 tablet 0  . mirtazapine (REMERON) 45 MG tablet Take 45 mg by mouth at bedtime.    . Na Sulfate-K Sulfate-Mg Sulf (SUPREP BOWEL PREP KIT) 17.5-3.13-1.6 GM/177ML SOLN Take 1 kit by mouth as directed. 1 Bottle 0  . pantoprazole (PROTONIX) 40 MG tablet Take 1 tablet (40 mg total) by mouth daily before breakfast. 28 tablet 5  . potassium chloride SA (K-DUR,KLOR-CON) 20 MEQ tablet Take 1 tablet (20 mEq total) by mouth daily. 90 tablet 3  . pravastatin (PRAVACHOL) 80 MG tablet Take 1 tablet (80 mg total) by mouth daily. 30 tablet 2  . risperiDONE (RISPERDAL) 2 MG tablet Take 2 mg by mouth at bedtime.    . potassium chloride SA (KLOR-CON) 20 MEQ tablet Take 1 tablet (20 mEq total) by mouth once for 1 dose. 6 tablet 0   No current facility-administered medications for this visit.    REVIEW OF SYSTEMS:   Review of Systems  Constitutional: Positive for malaise/fatigue and weight loss.    PHYSICAL EXAMINATION: ECOG  PERFORMANCE STATUS: 1 - Symptomatic but completely ambulatory  Vitals:   02/03/20 1255  BP: (!) 148/92  Pulse: 84  Resp: 16  Temp: (!) 97.1 F (36.2 C)  SpO2: 100%   Filed Weights   02/03/20 1255  Weight: 118 lb 4.8 oz (53.7 kg)    Physical Exam Constitutional:      Appearance: Normal appearance.  HENT:     Head: Normocephalic and atraumatic.  Eyes:     Pupils: Pupils are equal, round, and reactive to light.  Cardiovascular:     Rate and Rhythm: Normal rate and regular rhythm.     Heart sounds: Normal heart sounds. No murmur heard.   Pulmonary:     Effort: Pulmonary effort is normal.     Breath sounds: Normal breath sounds. No wheezing.  Abdominal:     General: Bowel sounds are normal. There is no distension.     Palpations: Abdomen is soft.     Tenderness: There is no abdominal tenderness.  Musculoskeletal:        General: Normal range of motion.     Cervical back: Normal range of motion.  Skin:    General: Skin is warm and dry.     Findings: No rash.  Neurological:     Mental Status: She is alert and oriented to person, place, and time.  Psychiatric:        Judgment: Judgment normal.     LABORATORY DATA:  I have reviewed the data as listed Recent Results (from the past 2160 hour(s))  CBC     Status: Abnormal   Collection Time: 12/14/19  3:02 PM  Result Value Ref Range   WBC 11.7 (H) 4.0 - 10.5 K/uL   RBC 4.89 3.87 - 5.11 MIL/uL   Hemoglobin 14.0 12.0 - 15.0 g/dL   HCT 43.9 36 - 46 %   MCV 89.8 80.0 - 100.0 fL  MCH 28.6 26.0 - 34.0 pg   MCHC 31.9 30.0 - 36.0 g/dL   RDW 13.3 11.5 - 15.5 %   Platelets 218 150 - 400 K/uL   nRBC 0.0 0.0 - 0.2 %    Comment: Performed at [facility], [address], [place], [place] [address]  Comprehensive metabolic panel     Status: Abnormal   Collection Time: 12/14/19  3:02 PM  Result Value Ref Range   Sodium 135 135 - 145 mmol/L   Potassium 4.0 3.5 - 5.1 mmol/L   Chloride 97 (L) 98 - 111 mmol/L   CO2 25 22 - 32  mmol/L   Glucose, Bld 192 (H) 70 - 99 mg/dL    Comment: Glucose reference range applies only to samples taken after fasting for at least 8 hours.   BUN 10 8 - 23 mg/dL   Creatinine, Ser 0.62 0.44 - 1.00 mg/dL   Calcium 9.5 8.9 - 10.3 mg/dL   Total Protein 8.3 (H) 6.5 - 8.1 g/dL   Albumin 4.4 3.5 - 5.0 g/dL   AST 13 (L) 15 - 41 U/L   ALT 10 0 - 44 U/L   Alkaline Phosphatase 56 38 - 126 U/L   Total Bilirubin 0.4 0.3 - 1.2 mg/dL   GFR calc non Af Amer >60 >60 mL/min   GFR calc Af Amer >60 >60 mL/min   Anion gap 13 5 - 15    Comment: Performed at [facility], [address], [place], [place] [address]  TSH     Status: None   Collection Time: 12/14/19  3:02 PM  Result Value Ref Range   TSH 1.512 0.350 - 4.500 uIU/mL    Comment: Performed by a 3rd Generation assay with a functional sensitivity of <=0.01 uIU/mL. Performed at [facility], [address], [place], [place] [address]   Differential     Status: Abnormal   Collection Time: 12/14/19  4:18 PM  Result Value Ref Range   Neutrophils Relative % 43 %   Neutro Abs 4.9 1.7 - 7.7 K/uL   Lymphocytes Relative 47 %   Lymphs Abs 5.4 (H) 0.7 - 4.0 K/uL   Monocytes Relative 7 %   Monocytes Absolute 0.8 0.1 - 1.0 K/uL   Eosinophils Relative 2 %   Eosinophils Absolute 0.2 0.0 - 0.5 K/uL   Basophils Relative 1 %   Basophils Absolute 0.1 0.0 - 0.1 K/uL   Immature Granulocytes 0 %   Abs Immature Granulocytes 0.03 0.00 - 0.07 K/uL    Comment: Performed at [facility], [address] [place], [place] [address]  CBC with Differential/Platelet     Status: Abnormal   Collection Time: 02/03/20  2:45 PM  Result Value Ref Range   WBC 9.5 4.0 - 10.5 K/uL   RBC 4.88 3.87 - 5.11 MIL/uL   Hemoglobin 13.7 12.0 - 15.0 g/dL   HCT 43.0 36 - 46 %   MCV 88.1 80.0 - 100.0 fL   MCH 28.1 26.0 - 34.0 pg   MCHC 31.9 30.0 - 36.0 g/dL   RDW 13.8 11.5 - 15.5 %   Platelets 199 150 - 400 K/uL   nRBC 0.0 0.0 - 0.2 %   Neutrophils Relative % 43 %   Neutro Abs  4.1 1.7 - 7.7 K/uL   Lymphocytes Relative 50 %   Lymphs Abs 4.7 (H) 0.7 - 4.0 K/uL   Monocytes Relative 6 %   Monocytes Absolute 0.5 0.1 - 1.0 K/uL   Eosinophils  Relative 1 %   Eosinophils Absolute 0.1 0.0 - 0.5 K/uL   Basophils Relative 0 %   Basophils Absolute 0.0 0.0 - 0.1 K/uL   Immature Granulocytes 0 %   Abs Immature Granulocytes 0.02 0.00 - 0.07 K/uL    Comment: Performed at [facility], [address], [place], [place] [address]  Comprehensive metabolic panel     Status: Abnormal   Collection Time: 02/03/20  2:45 PM  Result Value Ref Range   Sodium 133 (L) 135 - 145 mmol/L   Potassium 2.7 (LL) 3.5 - 5.1 mmol/L    Comment: CRITICAL RESULT CALLED TO, READ BACK BY AND VERIFIED WITH: ANDERSON,A ON 02/03/20 AT 1605 BY LOY,C    Chloride 98 98 - 111 mmol/L   CO2 25 22 - 32 mmol/L   Glucose, Bld 117 (H) 70 - 99 mg/dL    Comment: Glucose reference range applies only to samples taken after fasting for at least 8 hours.   BUN 6 (L) 8 - 23 mg/dL   Creatinine, Ser 0.59 0.44 - 1.00 mg/dL   Calcium 9.2 8.9 - 10.3 mg/dL   Total Protein 7.6 6.5 - 8.1 g/dL   Albumin 4.0 3.5 - 5.0 g/dL   AST 19 15 - 41 U/L   ALT 19 0 - 44 U/L   Alkaline Phosphatase 52 38 - 126 U/L   Total Bilirubin 0.6 0.3 - 1.2 mg/dL   GFR, Estimated >60 >60 mL/min    Comment: (NOTE) Calculated using the CKD-EPI Creatinine Equation (2021)    Anion gap 10 5 - 15    Comment: Performed at [facility], [address], [place], [place] [address]  Lactate dehydrogenase     Status: None   Collection Time: 02/03/20  2:45 PM  Result Value Ref Range   LDH 126 98 - 192 U/L    Comment: Performed at [facility], [address], [place], [place] [address]    RADIOGRAPHIC STUDIES: I have personally reviewed the radiological images as listed and agreed with the findings in the report. No results found.  ASSESSMENT & PLAN:  Leukocytosis: -Appears to be present for the past few months. -Most recent white count is 11.3; mainly  lymphocytes 5.2 -Had a normal white count 2 years ago. -Has drenching night sweats and unintentional weight loss of greater than 30 pounds in 3 months. -Denies any fevers or recent infections. -Admits to anorexia secondary to early satiety -Was evaluated at [facility] on 01/04/20 and had a CT chest abdomen pelvis which showed no suspicious pulmonary mass, subpleural reticular opacities in right lung are favored to be atelectasis.  Small area of enhancement within the right liver that measures 1.5 x 1.6 cm.  Additional area of hyper enhancement is observed adjacent to the gallbladder and the more inferior right liver.  These findings are favored to represent focal perfusional defects but could be better evaluated with abdominal MRI on a nonemergent basis if clinically indicated. -Former smoker; 1 pack of cigarettes per day for greater than 20 years.  Quit smoking about 3 months ago. -Has family history of lung cancer (father) and autoimmune disorder lupus (brother).  Plan:  Leukocytosis: -Likely secondary to tobacco abuse. -White count appears to be trending back down to normal since quitting. -We will recheck labs today: Including CBC with differential, peripheral smear, CMP, flow cytometry, LDH  Unintentional weight loss: -Has family history of malignancy. -Former smoker will get CT chest  Family history of autoimmune disorders: -Brother has lupus. -We will  get ANA and RF today as well.  Disposition: RTC in about 3 weeks to review results of lab work and imaging with Dr. Delton Coombes.  Addendum: I have independently interviewed and examined this patient.  I agree with HPI written by my nurse practitioner, Faythe Casa, NP.  This patient was referred to [place] for evaluation of leukocytosis.  We will repeat her CBC today and check her LDH.  We will also do flow cytometry as previous CBC showed increased lymphocytosis.  Smoking is definitely under consideration.  She also has weight loss for which we  will do CT scan of the chest with contrast given her extensive smoking history (half pack per day for 40 years).  Recent CT abdomen and pelvis at [facility] did not show any major findings.  We will follow up with patient in a couple of weeks.   No problem-specific Assessment & Plan notes found for this encounter.       Derek Jack, MD 02/03/20 6:24 PM

## 2020-02-03 NOTE — Progress Notes (Signed)
Critical Potassium of 2.7. Prescription for Potassium sent per Dr. Delton Coombes. Dr. Delton Coombes instructs that the patient should take 168mEq tonight at 19mEq tomorrow and have her potassium level rechecked. Patient aware and verbalized understanding.

## 2020-02-04 ENCOUNTER — Inpatient Hospital Stay (HOSPITAL_COMMUNITY): Payer: Medicare Other

## 2020-02-04 DIAGNOSIS — D72829 Elevated white blood cell count, unspecified: Secondary | ICD-10-CM | POA: Diagnosis not present

## 2020-02-04 LAB — PATHOLOGIST SMEAR REVIEW

## 2020-02-04 LAB — RHEUMATOID FACTOR: Rheumatoid fact SerPl-aCnc: 35.6 IU/mL — ABNORMAL HIGH (ref 0.0–13.9)

## 2020-02-04 LAB — ANTINUCLEAR ANTIBODIES, IFA: ANA Ab, IFA: NEGATIVE

## 2020-02-04 LAB — POTASSIUM: Potassium: 4.3 mmol/L (ref 3.5–5.1)

## 2020-02-04 MED ORDER — OCTREOTIDE ACETATE 30 MG IM KIT
PACK | INTRAMUSCULAR | Status: AC
  Filled 2020-02-04: qty 1

## 2020-02-05 MED ORDER — OCTREOTIDE ACETATE 30 MG IM KIT
PACK | INTRAMUSCULAR | Status: AC
  Filled 2020-02-05: qty 1

## 2020-02-06 LAB — CD19 AND CD20, FLOW CYTOMETRY

## 2020-02-24 ENCOUNTER — Ambulatory Visit (HOSPITAL_COMMUNITY): Payer: Medicare Other

## 2020-02-26 ENCOUNTER — Inpatient Hospital Stay (HOSPITAL_COMMUNITY): Payer: Medicare Other | Admitting: Hematology

## 2022-06-13 ENCOUNTER — Encounter: Payer: Self-pay | Admitting: *Deleted

## 2023-08-01 ENCOUNTER — Other Ambulatory Visit (HOSPITAL_COMMUNITY): Payer: Self-pay | Admitting: Family Medicine

## 2023-08-01 DIAGNOSIS — I671 Cerebral aneurysm, nonruptured: Secondary | ICD-10-CM

## 2024-02-19 ENCOUNTER — Inpatient Hospital Stay (HOSPITAL_COMMUNITY)
Admission: EM | Admit: 2024-02-19 | Discharge: 2024-02-23 | DRG: 291 | Disposition: A | Discharge status: Home Health | Attending: Internal Medicine | Admitting: Internal Medicine

## 2024-02-19 ENCOUNTER — Other Ambulatory Visit: Payer: Self-pay

## 2024-02-19 ENCOUNTER — Encounter (HOSPITAL_COMMUNITY): Payer: Self-pay

## 2024-02-19 ENCOUNTER — Emergency Department (HOSPITAL_COMMUNITY)

## 2024-02-19 DIAGNOSIS — Z880 Allergy status to penicillin: Secondary | ICD-10-CM | POA: Diagnosis not present

## 2024-02-19 DIAGNOSIS — R7401 Elevation of levels of liver transaminase levels: Secondary | ICD-10-CM | POA: Diagnosis not present

## 2024-02-19 DIAGNOSIS — E1165 Type 2 diabetes mellitus with hyperglycemia: Secondary | ICD-10-CM | POA: Diagnosis not present

## 2024-02-19 DIAGNOSIS — Z7984 Long term (current) use of oral hypoglycemic drugs: Secondary | ICD-10-CM

## 2024-02-19 DIAGNOSIS — Z888 Allergy status to other drugs, medicaments and biological substances status: Secondary | ICD-10-CM | POA: Diagnosis not present

## 2024-02-19 DIAGNOSIS — I509 Heart failure, unspecified: Principal | ICD-10-CM

## 2024-02-19 DIAGNOSIS — Z7902 Long term (current) use of antithrombotics/antiplatelets: Secondary | ICD-10-CM | POA: Diagnosis not present

## 2024-02-19 DIAGNOSIS — E785 Hyperlipidemia, unspecified: Secondary | ICD-10-CM | POA: Diagnosis not present

## 2024-02-19 DIAGNOSIS — I5021 Acute systolic (congestive) heart failure: Secondary | ICD-10-CM | POA: Diagnosis not present

## 2024-02-19 DIAGNOSIS — I2489 Other forms of acute ischemic heart disease: Secondary | ICD-10-CM | POA: Diagnosis present

## 2024-02-19 DIAGNOSIS — I5033 Acute on chronic diastolic (congestive) heart failure: Secondary | ICD-10-CM | POA: Diagnosis not present

## 2024-02-19 DIAGNOSIS — Z8673 Personal history of transient ischemic attack (TIA), and cerebral infarction without residual deficits: Secondary | ICD-10-CM | POA: Diagnosis not present

## 2024-02-19 DIAGNOSIS — I110 Hypertensive heart disease with heart failure: Principal | ICD-10-CM | POA: Diagnosis present

## 2024-02-19 DIAGNOSIS — Z8249 Family history of ischemic heart disease and other diseases of the circulatory system: Secondary | ICD-10-CM

## 2024-02-19 DIAGNOSIS — I671 Cerebral aneurysm, nonruptured: Secondary | ICD-10-CM | POA: Diagnosis present

## 2024-02-19 DIAGNOSIS — R9431 Abnormal electrocardiogram [ECG] [EKG]: Secondary | ICD-10-CM | POA: Diagnosis not present

## 2024-02-19 DIAGNOSIS — R112 Nausea with vomiting, unspecified: Secondary | ICD-10-CM | POA: Diagnosis present

## 2024-02-19 DIAGNOSIS — Z800 Family history of malignant neoplasm of digestive organs: Secondary | ICD-10-CM

## 2024-02-19 DIAGNOSIS — R0789 Other chest pain: Secondary | ICD-10-CM | POA: Diagnosis present

## 2024-02-19 DIAGNOSIS — Z79899 Other long term (current) drug therapy: Secondary | ICD-10-CM

## 2024-02-19 DIAGNOSIS — R079 Chest pain, unspecified: Secondary | ICD-10-CM | POA: Diagnosis not present

## 2024-02-19 DIAGNOSIS — I5043 Acute on chronic combined systolic (congestive) and diastolic (congestive) heart failure: Secondary | ICD-10-CM | POA: Diagnosis present

## 2024-02-19 DIAGNOSIS — Z90710 Acquired absence of both cervix and uterus: Secondary | ICD-10-CM | POA: Diagnosis not present

## 2024-02-19 DIAGNOSIS — I10 Essential (primary) hypertension: Secondary | ICD-10-CM | POA: Diagnosis not present

## 2024-02-19 DIAGNOSIS — Z801 Family history of malignant neoplasm of trachea, bronchus and lung: Secondary | ICD-10-CM

## 2024-02-19 DIAGNOSIS — J9601 Acute respiratory failure with hypoxia: Principal | ICD-10-CM | POA: Diagnosis present

## 2024-02-19 DIAGNOSIS — I700 Atherosclerosis of aorta: Secondary | ICD-10-CM | POA: Diagnosis present

## 2024-02-19 DIAGNOSIS — I5031 Acute diastolic (congestive) heart failure: Secondary | ICD-10-CM | POA: Diagnosis not present

## 2024-02-19 DIAGNOSIS — Z720 Tobacco use: Secondary | ICD-10-CM | POA: Diagnosis not present

## 2024-02-19 DIAGNOSIS — J439 Emphysema, unspecified: Secondary | ICD-10-CM | POA: Diagnosis present

## 2024-02-19 DIAGNOSIS — Z860100 Personal history of colon polyps, unspecified: Secondary | ICD-10-CM

## 2024-02-19 DIAGNOSIS — F17210 Nicotine dependence, cigarettes, uncomplicated: Secondary | ICD-10-CM | POA: Diagnosis present

## 2024-02-19 DIAGNOSIS — E782 Mixed hyperlipidemia: Secondary | ICD-10-CM | POA: Diagnosis present

## 2024-02-19 DIAGNOSIS — I4581 Long QT syndrome: Secondary | ICD-10-CM | POA: Diagnosis not present

## 2024-02-19 DIAGNOSIS — R7989 Other specified abnormal findings of blood chemistry: Secondary | ICD-10-CM

## 2024-02-19 DIAGNOSIS — Z794 Long term (current) use of insulin: Secondary | ICD-10-CM | POA: Diagnosis not present

## 2024-02-19 DIAGNOSIS — K219 Gastro-esophageal reflux disease without esophagitis: Secondary | ICD-10-CM | POA: Diagnosis present

## 2024-02-19 DIAGNOSIS — R1310 Dysphagia, unspecified: Secondary | ICD-10-CM

## 2024-02-19 DIAGNOSIS — Z833 Family history of diabetes mellitus: Secondary | ICD-10-CM | POA: Diagnosis not present

## 2024-02-19 LAB — CBC
HCT: 40.3 % (ref 36.0–46.0)
Hemoglobin: 12.6 g/dL (ref 12.0–15.0)
MCH: 26.8 pg (ref 26.0–34.0)
MCHC: 31.3 g/dL (ref 30.0–36.0)
MCV: 85.6 fL (ref 80.0–100.0)
Platelets: 188 K/uL (ref 150–400)
RBC: 4.71 MIL/uL (ref 3.87–5.11)
RDW: 15.7 % — ABNORMAL HIGH (ref 11.5–15.5)
WBC: 10.4 K/uL (ref 4.0–10.5)
nRBC: 0 % (ref 0.0–0.2)

## 2024-02-19 LAB — COMPREHENSIVE METABOLIC PANEL WITH GFR
ALT: 47 U/L — ABNORMAL HIGH (ref 0–44)
AST: 59 U/L — ABNORMAL HIGH (ref 15–41)
Albumin: 4.3 g/dL (ref 3.5–5.0)
Alkaline Phosphatase: 105 U/L (ref 38–126)
Anion gap: 14 (ref 5–15)
BUN: 13 mg/dL (ref 8–23)
CO2: 20 mmol/L — ABNORMAL LOW (ref 22–32)
Calcium: 8.7 mg/dL — ABNORMAL LOW (ref 8.9–10.3)
Chloride: 103 mmol/L (ref 98–111)
Creatinine, Ser: 0.61 mg/dL (ref 0.44–1.00)
GFR, Estimated: >60 mL/min (ref >60)
Glucose, Bld: 178 mg/dL — ABNORMAL HIGH (ref 70–99)
Potassium: 4 mmol/L (ref 3.5–5.1)
Sodium: 137 mmol/L (ref 135–145)
Total Bilirubin: 0.7 mg/dL (ref 0.0–1.2)
Total Protein: 7.4 g/dL (ref 6.5–8.1)

## 2024-02-19 LAB — TROPONIN T, HIGH SENSITIVITY
Troponin T High Sensitivity: 22 ng/L — ABNORMAL HIGH (ref 0–19)
Troponin T High Sensitivity: 36 ng/L — ABNORMAL HIGH (ref 0–19)

## 2024-02-19 LAB — MAGNESIUM: Magnesium: 1.8 mg/dL (ref 1.7–2.4)

## 2024-02-19 LAB — PRO BRAIN NATRIURETIC PEPTIDE: Pro Brain Natriuretic Peptide: 6565 pg/mL — ABNORMAL HIGH (ref <300.0)

## 2024-02-19 MED ORDER — FUROSEMIDE 10 MG/ML IJ SOLN
80.0000 mg | Freq: Once | INTRAMUSCULAR | Status: AC
  Administered 2024-02-19: 80 mg via INTRAVENOUS
  Filled 2024-02-19: qty 8

## 2024-02-19 NOTE — ED Triage Notes (Signed)
 Pt bib EMS from home with c/o sob that started this morning, pt went to [facility] ED from PCP office this morning and left AMA- per EMS pt is noncompliant with medications. Pt was given one nitroglycerin  0.4 SL as BP was 220/140 initially. Pt 87% on room air. Pt placed on O2 at 3L

## 2024-02-19 NOTE — H&P (Incomplete)
 History and Physical    Patient: Rose Greer FMW:[id number] DOB: 1955/06/08 DOA: 02/19/2024 DOS: the patient was seen and examined on 02/19/2024 PCP: Katrinka Aquas, MD  Patient coming from: {Point_of_Origin:26777}  Chief Complaint:  Chief Complaint  Patient presents with   Shortness of Breath   HPI: Rose Greer is a 68 y.o. female with medical history significant of ***  Review of Systems: {ROS_Text:26778} Past Medical History:  Diagnosis Date   Arthritis    Brain aneurysm    CHF (congestive heart failure) ([facility])    CVA (cerebral vascular accident) ([facility]) 10/2016   started on Plavix  ; right sided weakness   Depression    Essential hypertension    History of hypertensive urgency with pulmonary edema April 2018   GERD (gastroesophageal reflux disease)    Hyperlipidemia    Type 2 diabetes mellitus ([facility])    Past Surgical History:  Procedure Laterality Date   ABDOMINAL HYSTERECTOMY     BACK SURGERY     BIOPSY  07/13/2017   Procedure: BIOPSY;  Surgeon: Shaaron Lamar HERO, MD;  [facility];  Service: Endoscopy;;  gastric biopsy    COLONOSCOPY WITH PROPOFOL  N/A 07/13/2017   Procedure: COLONOSCOPY WITH PROPOFOL ;  Surgeon: Shaaron Lamar HERO, MD;  [facility];  Service: Endoscopy;  Laterality: N/A;  1:15pm   ESOPHAGOGASTRODUODENOSCOPY (EGD) WITH PROPOFOL  N/A 07/13/2017   Procedure: ESOPHAGOGASTRODUODENOSCOPY (EGD) WITH PROPOFOL ;  Surgeon: Shaaron Lamar HERO, MD;  [facility];  Service: Endoscopy;  Laterality: N/A;   MALONEY DILATION N/A 07/13/2017   Procedure: AGAPITO DILATION;  Surgeon: Shaaron Lamar HERO, MD;  [facility];  Service: Endoscopy;  Laterality: N/A;   POLYPECTOMY  07/13/2017   Procedure: POLYPECTOMY;  Surgeon: Shaaron Lamar HERO, MD;  [facility];  Service: Endoscopy;;   splenic flexure polyp , sigmoid colon polyp cs times 2   Social History:  reports that she has been smoking cigarettes. She has never used smokeless tobacco.  She reports that she does not drink alcohol and does not use drugs.  Allergies  Allergen Reactions   Benazepril Other (See Comments)    confusion   Penicillins Rash    Has patient had a PCN reaction causing immediate rash, facial/tongue/throat swelling, SOB or lightheadedness with hypotension: Yes Has patient had a PCN reaction causing severe rash involving mucus membranes or skin necrosis: No Has patient had a PCN reaction that required hospitalization No Has patient had a PCN reaction occurring within the last 10 years: No If all of the above answers are NO, then may proceed with Cephalosporin use.     Family History  Problem Relation Age of Onset   Diabetes Mother    Hypertension Mother    Colon cancer Father        diagnosed at age 39    Lung cancer Father        thinks metastatic colon cancer   Diabetes Sister        4 sisters ( hypertension )   Hypertension Brother        4 brothers ( hypertension)     Prior to Admission medications   Medication Sig Start Date End Date Taking? Authorizing Provider  carvedilol  (COREG ) 6.25 MG tablet Take 6.25 mg by mouth 2 (two) times daily. 12/31/19   [provider]  citalopram  (CELEXA ) 40 MG tablet Take 40 mg by mouth daily. 06/02/16   [provider]  clopidogrel  (PLAVIX ) 75 MG tablet Take 1 tablet (75  mg total) by mouth daily. 10/19/16   Krishnan, Sendil K, MD  ENTRESTO 24-26 MG Take 1 tablet by mouth 2 (two) times daily. 12/31/19   [provider]  furosemide  (LASIX ) 40 MG tablet Take 1 tablet (40 mg total) by mouth daily. 07/09/16   Singh, Prashant K, MD  hydrALAZINE  (APRESOLINE ) 50 MG tablet Take 1.5 tablets (75 mg total) by mouth 3 (three) times daily. 10/19/16   Krishnan, Sendil K, MD  insulin  detemir (LEVEMIR ) 100 UNIT/ML injection Inject 0.2 mLs (20 Units total) into the skin daily. Patient taking differently: Inject 30 Units into the skin daily.  10/20/16   Krishnan, Sendil K, MD  isosorbide  dinitrate  (ISORDIL ) 10 MG tablet Take 10 mg by mouth 3 (three) times daily. 12/31/19   [provider]  isosorbide  mononitrate (IMDUR ) 30 MG 24 hr tablet Take 1 tablet (30 mg total) by mouth daily. 07/09/16   Singh, Prashant K, MD  lisinopril  (PRINIVIL ,ZESTRIL ) 10 MG tablet Take 1 tablet (10 mg total) by mouth daily. 10/19/16   Krishnan, Sendil K, MD  metFORMIN (GLUCOPHAGE) 500 MG tablet Take 500 mg by mouth 2 (two) times daily.     [provider]  methocarbamol  (ROBAXIN ) 500 MG tablet Take 1 tablet (500 mg total) by mouth 2 (two) times daily as needed for muscle spasms. 10/28/19   Midge Golas, MD  mirtazapine  (REMERON ) 45 MG tablet Take 45 mg by mouth at bedtime. 06/02/16   [provider]  Na Sulfate-K Sulfate-Mg Sulf (SUPREP BOWEL PREP KIT) 17.5-3.13-1.6 GM/177ML SOLN Take 1 kit by mouth as directed. 06/13/17   Rourk, Lamar HERO, MD  pantoprazole  (PROTONIX ) 40 MG tablet Take 1 tablet (40 mg total) by mouth daily before breakfast. 12/16/19   Marvis Camellia LABOR, NP  potassium chloride  SA (K-DUR,KLOR-CON ) 20 MEQ tablet Take 1 tablet (20 mEq total) by mouth daily. 07/20/16   Parthenia Olivia HERO, PA-C  potassium chloride  SA (KLOR-CON ) 20 MEQ tablet Take 1 tablet (20 mEq total) by mouth once for 1 dose. 02/03/20 02/03/20  Rogers Hai, MD  pravastatin  (PRAVACHOL ) 80 MG tablet Take 1 tablet (80 mg total) by mouth daily. 10/19/16   Krishnan, Sendil K, MD  risperiDONE  (RISPERDAL ) 2 MG tablet Take 2 mg by mouth at bedtime. 06/02/16   [provider]    Physical Exam: Vitals:   02/19/24 2006 02/19/24 2007 02/19/24 2015 02/19/24 2100  BP:   (!) 194/101 (!) 173/97  Pulse:   95 83  Resp:   (!) 31 (!) 24  Temp:   98 F (36.7 C)   TempSrc:   Oral   SpO2: (!) 87%  92% 94%  Weight:  53.7 kg    Height:  5' 3 (1.6 m)     *** Data Reviewed: {Tip this will not be part of the note when signed- Document your independent interpretation of telemetry tracing, EKG, lab, Radiology test or any other  diagnostic tests. Add any new diagnostic test ordered today. (Optional):26781} {Results:26384}  Assessment and Plan: No notes have been filed under this hospital service. Service: Hospitalist     Advance Care Planning:   Code Status: Prior ***  Consults: ***  Family Communication: ***  Severity of Illness: {Observation/Inpatient:21159}  Author: Posey Maier, DO 02/19/2024 10:10 PM  For on call review [email or web address].

## 2024-02-19 NOTE — ED Provider Notes (Signed)
 [place] [facility] DEPARTMENT AT [facility] Provider Note   CSN: [id number] Arrival date & time: 02/19/24  8044     Patient presents with: Shortness of Breath   Rose Greer is a 68 y.o. female.  She is brought in by ambulance.  Short of breath since this morning.  Some vague lower chest upper abdominal pain and right sided neck pain.  Cough productive of some clear sputum.  No known fever but feels hot and cold.  1 episode of vomiting.  No diarrhea no urinary symptoms.  She said she went to her doctor's office today and they sent her to [facility] but the wait was too long so she left.  Symptoms worsened.  She has a history of congestive heart failure, diabetes, hypertension, cerebral aneurysm.  She is on diuretics and Plavix .  {Add pertinent medical, surgical, social history, OB history to YEP:67052} The history is provided by the patient and the EMS personnel.  Shortness of Breath Severity:  Severe Onset quality:  Gradual Duration:  1 day Timing:  Constant Progression:  Unchanged Chronicity:  Recurrent Relieved by:  Nothing Worsened by:  Activity and coughing Ineffective treatments:  None tried Associated symptoms: abdominal pain, chest pain, cough, sputum production and vomiting   Associated symptoms: no fever, no hemoptysis and no wheezing   Risk factors: tobacco use        Prior to Admission medications   Medication Sig Start Date End Date Taking? Authorizing Provider  carvedilol  (COREG ) 6.25 MG tablet Take 6.25 mg by mouth 2 (two) times daily. 12/31/19   [provider]  citalopram  (CELEXA ) 40 MG tablet Take 40 mg by mouth daily. 06/02/16   [provider]  clopidogrel  (PLAVIX ) 75 MG tablet Take 1 tablet (75 mg total) by mouth daily. 10/19/16   Krishnan, Sendil K, MD  ENTRESTO 24-26 MG Take 1 tablet by mouth 2 (two) times daily. 12/31/19   [provider]  furosemide  (LASIX ) 40 MG tablet Take 1 tablet (40 mg total) by mouth  daily. 07/09/16   Singh, Prashant K, MD  hydrALAZINE  (APRESOLINE ) 50 MG tablet Take 1.5 tablets (75 mg total) by mouth 3 (three) times daily. 10/19/16   Krishnan, Sendil K, MD  insulin  detemir (LEVEMIR ) 100 UNIT/ML injection Inject 0.2 mLs (20 Units total) into the skin daily. Patient taking differently: Inject 30 Units into the skin daily.  10/20/16   Krishnan, Sendil K, MD  isosorbide  dinitrate (ISORDIL ) 10 MG tablet Take 10 mg by mouth 3 (three) times daily. 12/31/19   [provider]  isosorbide  mononitrate (IMDUR ) 30 MG 24 hr tablet Take 1 tablet (30 mg total) by mouth daily. 07/09/16   Dennise Lavada POUR, MD  lisinopril  (PRINIVIL ,ZESTRIL ) 10 MG tablet Take 1 tablet (10 mg total) by mouth daily. 10/19/16   Krishnan, Sendil K, MD  metFORMIN (GLUCOPHAGE) 500 MG tablet Take 500 mg by mouth 2 (two) times daily.     [provider]  methocarbamol  (ROBAXIN ) 500 MG tablet Take 1 tablet (500 mg total) by mouth 2 (two) times daily as needed for muscle spasms. 10/28/19   Midge Golas, MD  mirtazapine  (REMERON ) 45 MG tablet Take 45 mg by mouth at bedtime. 06/02/16   [provider]  Na Sulfate-K Sulfate-Mg Sulf (SUPREP BOWEL PREP KIT) 17.5-3.13-1.6 GM/177ML SOLN Take 1 kit by mouth as directed. 06/13/17   Shaaron Lamar HERO, MD  pantoprazole  (PROTONIX ) 40 MG tablet Take 1 tablet (40 mg total) by mouth daily before breakfast. 12/16/19  Marvis Camellia LABOR, NP  potassium chloride  SA (K-DUR,KLOR-CON ) 20 MEQ tablet Take 1 tablet (20 mEq total) by mouth daily. 07/20/16   Parthenia Olivia HERO, PA-C  potassium chloride  SA (KLOR-CON ) 20 MEQ tablet Take 1 tablet (20 mEq total) by mouth once for 1 dose. 02/03/20 02/03/20  Rogers Hai, MD  pravastatin  (PRAVACHOL ) 80 MG tablet Take 1 tablet (80 mg total) by mouth daily. 10/19/16   Krishnan, Sendil K, MD  risperiDONE  (RISPERDAL ) 2 MG tablet Take 2 mg by mouth at bedtime. 06/02/16   [provider]    Allergies: Benazepril and Penicillins    Review  of Systems  Constitutional:  Negative for fever.  Respiratory:  Positive for cough, sputum production and shortness of breath. Negative for hemoptysis and wheezing.   Cardiovascular:  Positive for chest pain.  Gastrointestinal:  Positive for abdominal pain and vomiting.    Updated Vital Signs Ht 5' 3 (1.6 m)   Wt 53.7 kg   SpO2 (!) 87% Comment: rooom air  BMI 20.97 kg/m   Physical Exam Vitals and nursing note reviewed.  Constitutional:      General: She is in acute distress.     Appearance: She is well-developed.  HENT:     Head: Normocephalic and atraumatic.  Eyes:     Conjunctiva/sclera: Conjunctivae normal.  Cardiovascular:     Rate and Rhythm: Normal rate and regular rhythm.     Heart sounds: No murmur heard. Pulmonary:     Effort: Tachypnea, accessory muscle usage and respiratory distress present.     Breath sounds: Normal breath sounds.  Abdominal:     Palpations: Abdomen is soft.     Tenderness: There is no abdominal tenderness.  Musculoskeletal:        General: No swelling.     Cervical back: Neck supple.     Right lower leg: No tenderness. No edema.     Left lower leg: No tenderness. No edema.  Skin:    General: Skin is warm and dry.     Capillary Refill: Capillary refill takes less than 2 seconds.  Neurological:     General: No focal deficit present.     Mental Status: She is alert.     (all labs ordered are listed, but only abnormal results are displayed) Labs Reviewed  MAGNESIUM  PRO BRAIN NATRIURETIC PEPTIDE  CBC  COMPREHENSIVE METABOLIC PANEL WITH GFR  TROPONIN T, HIGH SENSITIVITY    EKG: None  Radiology: No results found.  {Document cardiac monitor, telemetry assessment procedure when appropriate:32947} Procedures   Medications Ordered in the ED - No data to display  Clinical Course as of 02/19/24 2341  Mon Feb 19, 2024  2050 X-ray interpreted by me as increased interstitial edema.  Possible right lower lobe infiltrate.  Awaiting  radiology reading. [MB]  2209 Discussed with [facility] hospitalist Dr. Adefeso who will evaluate for admission. [MB]    Clinical Course User Index [MB] Towana Ozell BROCKS, MD   {Click here for ABCD2, HEART and other calculators REFRESH Note before signing:1}                              Medical Decision Making Amount and/or Complexity of Data Reviewed Labs: ordered. Radiology: ordered.  Risk Prescription drug management. Decision regarding hospitalization.   This patient complains of ***; this involves an extensive number of treatment Options and is a complaint that carries with it a high risk of complications and  morbidity. The differential includes ***  I ordered, reviewed and interpreted labs, which included *** I ordered medication *** and reviewed PMP when indicated. I ordered imaging studies which included *** and I independently    visualized and interpreted imaging which showed *** Additional history obtained from *** Previous records obtained and reviewed *** I consulted *** and discussed lab and imaging findings and discussed disposition.  Cardiac monitoring reviewed, *** Social determinants considered, *** Critical Interventions: ***  After the interventions stated above, I reevaluated the patient and found *** Admission and further testing considered, ***   {Document critical care time when appropriate  Document review of labs and clinical decision tools ie CHADS2VASC2, etc  Document your independent review of radiology images and any outside records  Document your discussion with family members, caretakers and with consultants  Document social determinants of health affecting pt's care  Document your decision making why or why not admission, treatments were needed:32947:::1}   Final diagnoses:  None    ED Discharge Orders     None

## 2024-02-19 NOTE — ED Notes (Signed)
 Patient transported to X-ray

## 2024-02-19 NOTE — ED Notes (Signed)
 ED Provider at bedside.

## 2024-02-20 ENCOUNTER — Inpatient Hospital Stay (HOSPITAL_COMMUNITY)

## 2024-02-20 ENCOUNTER — Encounter (HOSPITAL_COMMUNITY): Payer: Self-pay | Admitting: Internal Medicine

## 2024-02-20 DIAGNOSIS — R079 Chest pain, unspecified: Secondary | ICD-10-CM | POA: Diagnosis not present

## 2024-02-20 DIAGNOSIS — E785 Hyperlipidemia, unspecified: Secondary | ICD-10-CM

## 2024-02-20 DIAGNOSIS — I5021 Acute systolic (congestive) heart failure: Secondary | ICD-10-CM | POA: Diagnosis not present

## 2024-02-20 DIAGNOSIS — I5031 Acute diastolic (congestive) heart failure: Secondary | ICD-10-CM

## 2024-02-20 DIAGNOSIS — I4581 Long QT syndrome: Secondary | ICD-10-CM

## 2024-02-20 DIAGNOSIS — J9601 Acute respiratory failure with hypoxia: Secondary | ICD-10-CM | POA: Diagnosis not present

## 2024-02-20 DIAGNOSIS — I5033 Acute on chronic diastolic (congestive) heart failure: Secondary | ICD-10-CM | POA: Diagnosis not present

## 2024-02-20 LAB — GLUCOSE, CAPILLARY
Glucose-Capillary: 99 mg/dL (ref 70–99)
Glucose-Capillary: 107 mg/dL — ABNORMAL HIGH (ref 70–99)
Glucose-Capillary: 107 mg/dL — ABNORMAL HIGH (ref 70–99)
Glucose-Capillary: 110 mg/dL — ABNORMAL HIGH (ref 70–99)

## 2024-02-20 LAB — CBC
HCT: 38.9 % (ref 36.0–46.0)
Hemoglobin: 12.2 g/dL (ref 12.0–15.0)
MCH: 26.5 pg (ref 26.0–34.0)
MCHC: 31.4 g/dL (ref 30.0–36.0)
MCV: 84.6 fL (ref 80.0–100.0)
Platelets: 183 K/uL (ref 150–400)
RBC: 4.6 MIL/uL (ref 3.87–5.11)
RDW: 15.6 % — ABNORMAL HIGH (ref 11.5–15.5)
WBC: 8.4 K/uL (ref 4.0–10.5)
nRBC: 0 % (ref 0.0–0.2)

## 2024-02-20 LAB — ECHOCARDIOGRAM COMPLETE
AR max vel: 2 cm2
AV Area VTI: 1.83 cm2
AV Area mean vel: 1.85 cm2
AV Mean grad: 2.5 mmHg
AV Peak grad: 4.8 mmHg
Ao pk vel: 1.09 m/s
Area-P 1/2: 5.34 cm2
Calc EF: 41.8 %
Est EF: 40
Height: 63 in
S' Lateral: 3.7 cm
Single Plane A2C EF: 43 %
Single Plane A4C EF: 41.1 %
Weight: 1887.14 [oz_av]

## 2024-02-20 LAB — COMPREHENSIVE METABOLIC PANEL WITH GFR
ALT: 44 U/L (ref 0–44)
AST: 51 U/L — ABNORMAL HIGH (ref 15–41)
Albumin: 4.3 g/dL (ref 3.5–5.0)
Alkaline Phosphatase: 98 U/L (ref 38–126)
Anion gap: 13 (ref 5–15)
BUN: 14 mg/dL (ref 8–23)
CO2: 25 mmol/L (ref 22–32)
Calcium: 8.9 mg/dL (ref 8.9–10.3)
Chloride: 101 mmol/L (ref 98–111)
Creatinine, Ser: 0.8 mg/dL (ref 0.44–1.00)
GFR, Estimated: >60 mL/min (ref >60)
Glucose, Bld: 98 mg/dL (ref 70–99)
Potassium: 3.9 mmol/L (ref 3.5–5.1)
Sodium: 139 mmol/L (ref 135–145)
Total Bilirubin: 1 mg/dL (ref 0.0–1.2)
Total Protein: 7.2 g/dL (ref 6.5–8.1)

## 2024-02-20 LAB — MAGNESIUM: Magnesium: 2 mg/dL (ref 1.7–2.4)

## 2024-02-20 LAB — TROPONIN T, HIGH SENSITIVITY
Troponin T High Sensitivity: 30 ng/L — ABNORMAL HIGH (ref 0–19)
Troponin T High Sensitivity: 24 ng/L — ABNORMAL HIGH (ref 0–19)
Troponin T High Sensitivity: 21 ng/L — ABNORMAL HIGH (ref 0–19)

## 2024-02-20 LAB — HEMOGLOBIN A1C
Hgb A1c MFr Bld: 5.9 % — ABNORMAL HIGH (ref 4.8–5.6)
Mean Plasma Glucose: 122.63 mg/dL

## 2024-02-20 LAB — PHOSPHORUS: Phosphorus: 3.7 mg/dL (ref 2.5–4.6)

## 2024-02-20 MED ORDER — MORPHINE SULFATE (PF) 2 MG/ML IV SOLN
2.0000 mg | Freq: Once | INTRAVENOUS | Status: AC | PRN
  Administered 2024-02-20: 2 mg via INTRAVENOUS
  Filled 2024-02-20: qty 1

## 2024-02-20 MED ORDER — MAGNESIUM SULFATE 2 GM/50ML IV SOLN
2.0000 g | Freq: Once | INTRAVENOUS | Status: AC
  Administered 2024-02-20: 2 g via INTRAVENOUS
  Filled 2024-02-20: qty 50

## 2024-02-20 MED ORDER — PROCHLORPERAZINE EDISYLATE 10 MG/2ML IJ SOLN
10.0000 mg | Freq: Four times a day (QID) | INTRAMUSCULAR | Status: DC | PRN
  Administered 2024-02-20: 10 mg via INTRAVENOUS
  Filled 2024-02-20: qty 2

## 2024-02-20 MED ORDER — INSULIN ASPART 100 UNIT/ML IJ SOLN
0.0000 [IU] | Freq: Three times a day (TID) | INTRAMUSCULAR | Status: DC
  Administered 2024-02-22: 3 [IU] via SUBCUTANEOUS
  Administered 2024-02-22 – 2024-02-23 (×2): 1 [IU] via SUBCUTANEOUS
  Filled 2024-02-20 (×3): qty 1

## 2024-02-20 MED ORDER — CLOPIDOGREL BISULFATE 75 MG PO TABS: 75.0000 mg | ORAL_TABLET | Freq: Every day | ORAL | Status: DC

## 2024-02-20 MED ORDER — FUROSEMIDE 10 MG/ML IJ SOLN
40.0000 mg | Freq: Two times a day (BID) | INTRAMUSCULAR | Status: DC
  Administered 2024-02-20 – 2024-02-21 (×3): 40 mg via INTRAVENOUS
  Filled 2024-02-20 (×3): qty 4

## 2024-02-20 MED ORDER — ACETAMINOPHEN 325 MG PO TABS
650.0000 mg | ORAL_TABLET | Freq: Four times a day (QID) | ORAL | Status: DC | PRN
  Administered 2024-02-23: 650 mg via ORAL
  Filled 2024-02-20: qty 2

## 2024-02-20 MED ORDER — SACUBITRIL-VALSARTAN 24-26 MG PO TABS: 1.0000 | ORAL_TABLET | Freq: Two times a day (BID) | ORAL | Status: DC

## 2024-02-20 MED ORDER — INSULIN GLARGINE-YFGN 100 UNIT/ML ~~LOC~~ SOLN
10.0000 [IU] | Freq: Every day | SUBCUTANEOUS | Status: DC
  Administered 2024-02-20 – 2024-02-22 (×2): 10 [IU] via SUBCUTANEOUS
  Filled 2024-02-20 (×4): qty 0.1

## 2024-02-20 MED ORDER — CALCIUM CARBONATE 1250 (500 CA) MG PO TABS
1.0000 | ORAL_TABLET | Freq: Every day | ORAL | Status: DC
  Administered 2024-02-20 – 2024-02-23 (×4): 1250 mg via ORAL
  Filled 2024-02-20 (×4): qty 1

## 2024-02-20 MED ORDER — METOPROLOL SUCCINATE ER 25 MG PO TB24
25.0000 mg | ORAL_TABLET | Freq: Every day | ORAL | Status: DC
  Administered 2024-02-20 – 2024-02-23 (×3): 25 mg via ORAL
  Filled 2024-02-20 (×4): qty 1

## 2024-02-20 MED ORDER — PANTOPRAZOLE SODIUM 40 MG PO TBEC
40.0000 mg | DELAYED_RELEASE_TABLET | Freq: Every day | ORAL | Status: DC
  Administered 2024-02-20 – 2024-02-23 (×4): 40 mg via ORAL
  Filled 2024-02-20 (×4): qty 1

## 2024-02-20 MED ORDER — PRAVASTATIN SODIUM 40 MG PO TABS: 80.0000 mg | ORAL_TABLET | Freq: Every day | ORAL | Status: DC

## 2024-02-20 MED ORDER — INSULIN ASPART 100 UNIT/ML IJ SOLN: 0.0000 [IU] | Freq: Every day | INTRAMUSCULAR | Status: DC

## 2024-02-20 MED ORDER — CARVEDILOL 3.125 MG PO TABS: 6.2500 mg | ORAL_TABLET | Freq: Two times a day (BID) | ORAL | Status: DC

## 2024-02-20 MED ORDER — ORAL CARE MOUTH RINSE: 15.0000 mL | OROMUCOSAL | Status: DC | PRN

## 2024-02-20 MED ORDER — IRBESARTAN 150 MG PO TABS
150.0000 mg | ORAL_TABLET | Freq: Every day | ORAL | Status: DC
  Administered 2024-02-20 – 2024-02-21 (×2): 150 mg via ORAL
  Filled 2024-02-20 (×2): qty 1

## 2024-02-20 MED ORDER — ENOXAPARIN SODIUM 40 MG/0.4ML IJ SOSY
40.0000 mg | PREFILLED_SYRINGE | INTRAMUSCULAR | Status: DC
  Administered 2024-02-20 – 2024-02-23 (×4): 40 mg via SUBCUTANEOUS
  Filled 2024-02-20 (×4): qty 0.4

## 2024-02-20 MED ORDER — ACETAMINOPHEN 650 MG RE SUPP
650.0000 mg | Freq: Four times a day (QID) | RECTAL | Status: DC | PRN
Start: 2024-02-20 — End: 2024-02-23

## 2024-02-20 NOTE — Plan of Care (Signed)

## 2024-02-20 NOTE — Consult Note (Addendum)
 Cardiology Consultation   Patient ID: Rose Greer MRN: [id number]; DOB: 1956-02-17  Admit date: 02/19/2024 Date of Consult: 02/20/2024  PCP:  Katrinka Aquas, MD   [facility] Providers Cardiologist:  None     Patient Profile: Rose Greer is a 68 y.o. female with a hx of HTN, HLD, DM2, brain aneurysm, CVA on Plavix , tobacco use who is being seen 02/20/2024 for the evaluation of HF at the request of Dr. Maree.  History of Present Illness: [name] was last seen in 2018 by Dr. Debera for follow-up on cardiac testing.  Echo showed EF 50%, diffuse hypokinesis, G1 DD, mild LVH, mildly dilated LA.  Exercise Myoview  converted to Lexiscan  due to significant hypertensive response with overall low risk study.  Chest CT showed cardiomegaly with left atrial enlargement, mild LV enlargement, LVH, mild aortic arch atherosclerosis, COPD/emphysema.  No med changes.  Continued on ASA 81 mg daily, Coreg  25 mg twice daily, Lasix  40 mg daily, Imdur  30 mg daily, KCl 20 mEq daily, pravastatin  40 mg daily. She has been lost to care since then and has not followed with any other cardiologist.   Presented to [facility] ED 11/18 for worsening SOB. TN 22 >36>30>24, proBNP 6,565, K4 >3.9, Mg 2, CR 0.61 > 0.8, CBC WNL EKG: NSR, HR 81, prolonged QT 552 ms, new LVH, new upsloping ST segments in V2 V3, new prominent T wave inversions in V3-V6 CXR showed moderate severe interstitial edema and mild left basilar atelectasis and/or infiltrate.  Treated with IV Lasix  80 mg x1 then Lasix  40 mg x1, morphine 2 mg  On interview, patient reports worsening SOB X 3 days with very minimal exertion, edema in feet, dizziness, and palpitations described as racing heart lasting for a few hours.  Notes that she sleeps with 4 pillows for the past couple of years.  Currently, denies changes with SOB and notes SOB with movement in bed. Also report chest pressure in midsternum, constant 8/10 associated with sweating and N/V. Currently,  chest pressure still present but only 4/10. Denies any numbness/tingling, syncope.  Patient is able to to household duties without chest pain but reports SOB. Denies any tobacco use in the past month but previously smoked < 1/2 PPD for 20 years.  Denies any EtOH or drug use.  She was not on home O2 prior to hospitalization. Prior to hospitalization, patient was only on albuterol, valsartan and 1 other medication.  Family history is significant for MIs including mother age 42 and brother age 37 as well as HTN and diabetes.  Past Medical History:  Diagnosis Date   Arthritis    Brain aneurysm    CHF (congestive heart failure) (HCC)    CVA (cerebral vascular accident) (HCC) 10/2016   started on Plavix  ; right sided weakness   Depression    Essential hypertension    History of hypertensive urgency with pulmonary edema April 2018   GERD (gastroesophageal reflux disease)    Hyperlipidemia    Type 2 diabetes mellitus (HCC)     Past Surgical History:  Procedure Laterality Date   ABDOMINAL HYSTERECTOMY     BACK SURGERY     BIOPSY  07/13/2017   Procedure: BIOPSY;  Surgeon: Shaaron Lamar HERO, MD;  Location: [facility];  Service: Endoscopy;;  gastric biopsy    COLONOSCOPY WITH PROPOFOL  N/A 07/13/2017   Procedure: COLONOSCOPY WITH PROPOFOL ;  Surgeon: Shaaron Lamar HERO, MD;  Location: [facility];  Service: Endoscopy;  Laterality: N/A;  1:15pm   ESOPHAGOGASTRODUODENOSCOPY (  EGD) WITH PROPOFOL  N/A 07/13/2017   Procedure: ESOPHAGOGASTRODUODENOSCOPY (EGD) WITH PROPOFOL ;  Surgeon: Shaaron Lamar HERO, MD;  Location: [facility];  Service: Endoscopy;  Laterality: N/A;   MALONEY DILATION N/A 07/13/2017   Procedure: AGAPITO DILATION;  Surgeon: Shaaron Lamar HERO, MD;  Location: [facility];  Service: Endoscopy;  Laterality: N/A;   POLYPECTOMY  07/13/2017   Procedure: POLYPECTOMY;  Surgeon: Shaaron Lamar HERO, MD;  Location: [facility];  Service: Endoscopy;;   splenic flexure polyp , sigmoid colon polyp cs times 2      Home Medications:  Prior to Admission medications   Medication Sig Start Date End Date Taking? Authorizing Provider  carvedilol  (COREG ) 6.25 MG tablet Take 6.25 mg by mouth 2 (two) times daily. 12/31/19   [provider]  citalopram  (CELEXA ) 40 MG tablet Take 40 mg by mouth daily. 06/02/16   [provider]  clopidogrel  (PLAVIX ) 75 MG tablet Take 1 tablet (75 mg total) by mouth daily. 10/19/16   Krishnan, Sendil K, MD  ENTRESTO 24-26 MG Take 1 tablet by mouth 2 (two) times daily. 12/31/19   [provider]  furosemide  (LASIX ) 40 MG tablet Take 1 tablet (40 mg total) by mouth daily. 07/09/16   Singh, Prashant K, MD  hydrALAZINE  (APRESOLINE ) 50 MG tablet Take 1.5 tablets (75 mg total) by mouth 3 (three) times daily. 10/19/16   Krishnan, Sendil K, MD  insulin  detemir (LEVEMIR ) 100 UNIT/ML injection Inject 0.2 mLs (20 Units total) into the skin daily. Patient taking differently: Inject 30 Units into the skin daily.  10/20/16   Krishnan, Sendil K, MD  isosorbide  dinitrate (ISORDIL ) 10 MG tablet Take 10 mg by mouth 3 (three) times daily. 12/31/19   [provider]  isosorbide  mononitrate (IMDUR ) 30 MG 24 hr tablet Take 1 tablet (30 mg total) by mouth daily. 07/09/16   Dennise Lavada POUR, MD  lisinopril  (PRINIVIL ,ZESTRIL ) 10 MG tablet Take 1 tablet (10 mg total) by mouth daily. 10/19/16   Krishnan, Sendil K, MD  metFORMIN (GLUCOPHAGE) 500 MG tablet Take 500 mg by mouth 2 (two) times daily.     [provider]  methocarbamol  (ROBAXIN ) 500 MG tablet Take 1 tablet (500 mg total) by mouth 2 (two) times daily as needed for muscle spasms. 10/28/19   Midge Golas, MD  mirtazapine  (REMERON ) 45 MG tablet Take 45 mg by mouth at bedtime. 06/02/16   [provider]  Na Sulfate-K Sulfate-Mg Sulf (SUPREP BOWEL PREP KIT) 17.5-3.13-1.6 GM/177ML SOLN Take 1 kit by mouth as directed. 06/13/17   Rourk, Lamar HERO, MD  pantoprazole  (PROTONIX ) 40 MG tablet Take 1 tablet (40 mg total)  by mouth daily before breakfast. 12/16/19   Marvis Camellia LABOR, NP  potassium chloride  SA (K-DUR,KLOR-CON ) 20 MEQ tablet Take 1 tablet (20 mEq total) by mouth daily. 07/20/16   Parthenia Olivia HERO, PA-C  potassium chloride  SA (KLOR-CON ) 20 MEQ tablet Take 1 tablet (20 mEq total) by mouth once for 1 dose. 02/03/20 02/03/20  Rogers Hai, MD  pravastatin  (PRAVACHOL ) 80 MG tablet Take 1 tablet (80 mg total) by mouth daily. 10/19/16   Krishnan, Sendil K, MD  risperiDONE  (RISPERDAL ) 2 MG tablet Take 2 mg by mouth at bedtime. 06/02/16   [provider]    Scheduled Meds:  calcium  carbonate  1 tablet Oral Q breakfast   enoxaparin  (LOVENOX ) injection  40 mg Subcutaneous Q24H   furosemide   40 mg Intravenous Q12H   insulin  aspart  0-5 Units Subcutaneous QHS  insulin  aspart  0-9 Units Subcutaneous TID WC   insulin  glargine-yfgn  10 Units Subcutaneous QHS   irbesartan  150 mg Oral Daily   metoprolol  succinate  25 mg Oral Daily   pantoprazole   40 mg Oral QAC breakfast   Continuous Infusions:  PRN Meds: acetaminophen  **OR** acetaminophen , mouth rinse, prochlorperazine  Allergies:    Allergies  Allergen Reactions   Benazepril Other (See Comments)    confusion   Penicillins Rash    Has patient had a PCN reaction causing immediate rash, facial/tongue/throat swelling, SOB or lightheadedness with hypotension: Yes Has patient had a PCN reaction causing severe rash involving mucus membranes or skin necrosis: No Has patient had a PCN reaction that required hospitalization No Has patient had a PCN reaction occurring within the last 10 years: No If all of the above answers are NO, then may proceed with Cephalosporin use.     Social History:   Social History   Socioeconomic History   Marital status: Single    Spouse name: Not on file   Number of children: Not on file   Years of education: Not on file   Highest education level: Not on file  Occupational History   Not on file  Tobacco Use    Smoking status: Some Days    Current packs/day: 0.25    Types: Cigarettes   Smokeless tobacco: Never  Vaping Use   Vaping status: Never Used  Substance and Sexual Activity   Alcohol use: No   Drug use: No   Sexual activity: Yes    Birth control/protection: Surgical  Other Topics Concern   Not on file  Social History Narrative   Not on file    Family History:    Family History  Problem Relation Age of Onset   Diabetes Mother    Hypertension Mother    Colon cancer Father        diagnosed at age 57    Lung cancer Father        thinks metastatic colon cancer   Diabetes Sister        4 sisters ( hypertension )   Hypertension Brother        4 brothers ( hypertension)      ROS:  Please see the history of present illness.   All other ROS reviewed and negative.     Physical Exam/Data: Vitals:   02/20/24 0028 02/20/24 0436 02/20/24 0500 02/20/24 0544  BP: (!) 179/102   (!) 146/71  Pulse: 86   73  Resp: 18   18  Temp: 98.3 F (36.8 C)   97.9 F (36.6 C)  TempSrc: Oral   Oral  SpO2: 99% 99%  99%  Weight:   53.5 kg   Height:        Intake/Output Summary (Last 24 hours) at 02/20/2024 1021 Last data filed at 02/20/2024 0655 Gross per 24 hour  Intake 120 ml  Output 150 ml  Net -30 ml      02/20/2024    5:00 AM 02/19/2024    8:07 PM 02/03/2020   12:55 PM  Last 3 Weights  Weight (lbs) 117 lb 15.1 oz 118 lb 6.2 oz 118 lb 4.8 oz  Weight (kg) 53.5 kg 53.7 kg 53.661 kg     Body mass index is 20.89 kg/m.  General:  Well nourished, well developed, in no acute distress; Wearing [place] O2 HEENT: normal Neck: no JVD Vascular: No carotid bruits; Distal pulses 2+ bilaterally Cardiac:  normal S1,  S2; RRR; no murmur  Lungs:  clear to auscultation bilaterally, no wheezing, rhonchi or rales  Abd: soft, nontender, no hepatomegaly  Ext: no edema; SCD present.  Musculoskeletal:  No deformities, BUE and BLE strength normal and equal Skin: warm and dry  Neuro:  CNs 2-12 intact,  no focal abnormalities noted Psych:  Normal affect   EKG:  The EKG was personally reviewed and demonstrates:  NSR, HR 81, new LVH, new upsloping ST segments in V2 V3, new prominent T wave inversions in V3-V6 Telemetry:  Telemetry was personally reviewed and demonstrates:  NSR, HR 70-90's  Relevant CV Studies: ECHO pending  Lexiscan  in 2018 Narrative & Impression  No diagnostic ST segment changes to indicate ischemia. Exercise testing converted to Lexiscan  due to significant hypertensive response. Blood pressure demonstrated a hypertensive response to exercise. Small, mild intensity, partially reversible anteroapical defect that most likely represents variable soft tissue attenuation, less likely small ischemic zone. This is a low risk study. Nuclear stress EF: 51%.    ECHO 2018 Conclusions  - Left ventricle: The cavity size was normal. There was mild focal    basal hypertrophy of the septum. Systolic function was mildly    reduced. The estimated ejection fraction was 50%. Mild diffuse    hypokinesis. Doppler parameters are consistent with abnormal left    ventricular relaxation (grade 1 diastolic dysfunction). Doppler    parameters are consistent with high ventricular filling pressure.  - Aortic valve: Transvalvular velocity was within the normal range.    There was no stenosis. There was no regurgitation.  - Mitral valve: Transvalvular velocity was within the normal range.    There was no evidence for stenosis. There was trivial    regurgitation.  - Left atrium: The atrium was mildly dilated.  - Right ventricle: The cavity size was normal. Wall thickness was    normal. Systolic function was normal.  - Atrial septum: No defect or patent foramen ovale was identified    by color flow Doppler.  - Tricuspid valve: There was trivial regurgitation.  - Pulmonary arteries: Systolic pressure was within the normal    range. PA peak pressure: 30 mm Hg (S).   Carotid US  summary: 2018 -  The vertebral arteries appear patent with antegrade flow.  - Findings consistent with a 1- 39 percent stenosis involving the    right internal carotid artery and the left internal carotid    artery. Low level echoes seen distal left common carotid artery,    can not rule out soft plaqe vs thrombus.Correlate with Ct angio    if clinically necessary   Laboratory Data: High Sensitivity Troponin:  No results for input(s): TROPONINIHS in the last 720 hours.   Chemistry Recent Labs  Lab 02/19/24 2015 02/20/24 0453  NA 137 139  K 4.0 3.9  CL 103 101  CO2 20* 25  GLUCOSE 178* 98  BUN 13 14  CREATININE 0.61 0.80  CALCIUM  8.7* 8.9  MG 1.8 2.0  GFRNONAA >60 >60  ANIONGAP 14 13    Recent Labs  Lab 02/19/24 2015 02/20/24 0453  PROT 7.4 7.2  ALBUMIN 4.3 4.3  AST 59* 51*  ALT 47* 44  ALKPHOS 105 98  BILITOT 0.7 1.0   Lipids No results for input(s): CHOL, TRIG, HDL, LABVLDL, LDLCALC, CHOLHDL in the last 168 hours.  Hematology Recent Labs  Lab 02/19/24 2015 02/20/24 0453  WBC 10.4 8.4  RBC 4.71 4.60  HGB 12.6 12.2  HCT 40.3 38.9  MCV  85.6 84.6  MCH 26.8 26.5  MCHC 31.3 31.4  RDW 15.7* 15.6*  PLT 188 183   Thyroid  No results for input(s): TSH, FREET4 in the last 168 hours.  BNP Recent Labs  Lab 02/19/24 2015  PROBNP 6,565.0*    DDimer No results for input(s): DDIMER in the last 168 hours.  Radiology/Studies:  DG Chest Portable 1 View Result Date: 02/19/2024 CLINICAL DATA:  Shortness of breath. EXAM: PORTABLE CHEST 1 VIEW COMPARISON:  December 14, 2019 FINDINGS: The heart size and mediastinal contours are within normal limits. There is moderate severity calcification of the aortic arch. Moderate severity diffusely increased interstitial lung markings are seen. Mild atelectasis and/or infiltrate is also noted within the retrocardiac region of the left lung base. No pleural effusion or pneumothorax is identified. The visualized skeletal structures are  unremarkable. IMPRESSION: 1. Moderate severity interstitial edema. 2. Mild left basilar atelectasis and/or infiltrate. Electronically Signed   By: Suzen Dials M.D.   On: 02/19/2024 21:22     Assessment and Plan: Acute on chronic HFpEF Echo showed EF 50%, diffuse hypokinesis, G1 DD, mild LVH, mildly dilated LA. ECHO pending.  Presents with worsening SOB X 3 days with very minimal exertion, edema in feet and chronic unchanged PND. Currently, patient notes ongoing SOB with minimal movement in bed.  proBNP 6,565  CXR showed moderate severe interstitial edema and mild left basilar atelectasis and/or infiltrate.  Treated with IV Lasix  80 mg x1 then Lasix  40 mg x1. Reports good urine output but very little urine output documented. Wearing SCD. Appears Euvolemic on exam. Would continue Lasix  for today then consider switching to PO tomorrow.  Net I/O: -30 ml, Wt 117 lbs, Cr 0.61 > 0.8, Order Red Vest.  Suspect I/O inaccurately documented. Continue with strict I/O, daily standing weights if possible, and monitoring renal function.   Encouraged low sodium diet, fluid restriction <2L, and daily weights.  Educated to contact our office for weight gain of 2 lbs overnight or 5 lbs in one week. ED precautions discussed.    Acute hypoxemic respiratory failure  Patient does not wear O2 at baseline  Currently on [place] 2L/min  Elevated troponin Chest discomfort  Echo 2018 as above. ECHO pending.  2018:Myoview  converted to Lexiscan  due to significant hypertensive response with overall low risk study. Presented with chest pressure in midsternum, constant 8/10 associated with sweating and N/V. Currently, chest pressure still present but only 4/10.  TN 22 >36>30>24 EKG: NSR, HR 81, new LVH, new upsloping ST segments in V2 V3, new prominent T wave inversions in V3-V6. Order Repeat EKG.  Less concern for ACS with flat troponin, however EKG changes are concerning. Can reassessed based on ECHO.   Prolonged QT  interval QTc 552 MS. Order Repeat EKG.  MG 2.  Avoid QT prolonging meds.   HLD LDL 126 in 2018. Order FLP in am.  Continue pravastatin  40 mg daily  History of CVA Continue on Plavix  75 mg and pravastatin  80 mg  Risk Assessment/Risk Scores:    TIMI Risk Score for Unstable Angina or Non-ST Elevation MI:   The patient's TIMI risk score is 4, which indicates a 20% risk of all cause mortality, new or recurrent myocardial infarction or need for urgent revascularization in the next 14 days.{   [place]  Heart Association (NYHA) Functional Class NYHA Class III    For questions or updates, please contact [facility] Please consult [email or web address] for contact info under      Signed, Lorette  CINDERELLA Kapur, PA-C  02/20/2024 10:21 AM   Attending Note   Patient seen and discussed with PA Kapur, I agree with her documentation. 68 yo female history of HTN with prior admissions for HTN urgency with pulmonary edema in 2018, chronic HFpEF, HLD, DM2, presents with SOB. Reports some LE edema, feeling of abdominal distension.    ER vitals: P 95 bp 194/101 87% RA Mg 1.8 proBNP 6565 WBC 10.4 Hgb 12.6 Plt 188 K 4 Cr 0.61 BUN 13  Trop 22-->36-->30-->24-->21 EKG: SR, LVH with strain pattern CXR mod edema  07/2016 lexiscan : no clear ishcemia.  07/2016 echo: LVEF 50%, mild LVH, grade I dd, mild LAE    1.Acute on chronic HFpEF - 07/2016 echo: LVEF 50%, mild LVH, grade I dd, mild LAE - CXR mod edema, proBNP 6565 - bp's 190s/110s on presentation, likely exacerbating factor - echo pending to reassess for any new cardiac dysfunction   -received IV lasix  80mg  x 1 yesterday, scheduled for 40mg  bid today. I/Os incomplete. Reported bed weights 118-->117 lbs, will ask for standing weights. Slight uptrend in Cr but still WNL. - continue IV diuresis today - f/u echocardiogram. Off bp meds x 3 years, risk for progression of HTN heart disease/cardiomyopathy.    2.Elevated troponin - mild and flat  in the setting of severe HTN, hypoxia, HF - at this time do not suspect ACS - TWIs on EKG are new since 2021 but in the setting of severe LVH. Given her bp on presentation would suspect poor compliance with bp meds, LVH likely has progressed since 2021 - she does confirm had not been on bp meds for about 3 years, just recently restarted per her report  3. HTN - reports had been off home bp meds for about 3 years, was just restarting prior to admission - sbps 190s on presentation, down to 140s today. Follow trend on home regimen of toprol  and irbesartan (in place of home valsartan due not being on formulary).   Dorn Ross MD

## 2024-02-20 NOTE — Progress Notes (Incomplete)
 Attending Note   Patient seen and discussed with PA Sheron, I agree with her documentation. 68 yo female history of HTN with prior admissions for HTN urgency with pulmonary edema in 2018, chronic HFpEF, HLD, DM2, presents with SOB. Reports some LE edema, feeling of abdominal distension.    ER vitals: P 95 bp 194/101 87% RA Mg 1.8 proBNP 6565 WBC 10.4 Hgb 12.6 Plt 188 K 4 Cr 0.61 BUN 13  Trop 22-->36-->30-->24-->21 EKG: SR, LVH with strain pattern CXR mod edema     07/2016 lexiscan : no clear ishcemia.  07/2016 echo: LVEF 50%, mild LVH, grade I dd, mild LAE    1.Acute on chronic HFpEF - 07/2016 echo: LVEF 50%, mild LVH, grade I dd, mild LAE - CXR mod edema, proBNP 6565 - bp's 190s/110s on presentation, likely exacerbating factor - echo pending to reassess for any new cardiac dysfunction   -received IV lasix  80mg  x 1 yesterday, scheduled for 40mg  bid today. I/Os incomplete. Reported bed weights 118-->117 lbs, will ask for standing weights. Slight uptrend in Cr but still WNL. - continue IV diuresis today - f/u echocardiogram. Off bp meds x 3 years, risk for progression of HTN heart disease/cardiomyopathy.    2.Elevated troponin - mild and flat in the setting of severe HTN, hypoxia, HF - at this time do not suspect ACS - TWIs on EKG are new since 2021 but in the setting of severe LVH. Given her bp on presentation would suspect poor compliance with bp meds, LVH likely has progressed since 2021 - she does confirm had not been on bp meds for about 3 years, just recently restarted per her report  3. HTN - reports had been off home bp meds for about 3 years, was just restarting prior to admission - sbps 190s on presentation, down to 140s today. Follow trend on home regimen of toprol  and irbesartan (in place of home valsartan due not being on formulary).   Dorn Ross MD

## 2024-02-20 NOTE — Progress Notes (Signed)
  Echocardiogram 2D Echocardiogram has been performed.  Tinnie FORBES Gosling RDCS 02/20/2024, 10:33 AM

## 2024-02-20 NOTE — Progress Notes (Signed)
   02/20/24 1242  TOC Brief Assessment  Insurance and Status Reviewed  Patient has primary care physician Yes  Home environment has been reviewed Home  Prior level of function: Independent  Prior/Current Home Services No current home services  Social Drivers of Health Review SDOH reviewed no interventions necessary  Readmission risk has been reviewed Yes  Transition of care needs no transition of care needs at this time   Inpatient Care Manager (ICM) has reviewed patient and no ICM needs have been identified at this time. We will continue to monitor patient advancement through interdisciplinary progression rounds. If new patient transition needs arise, please place a ICM consult.

## 2024-02-20 NOTE — Progress Notes (Signed)
 PROGRESS NOTE    Rose Greer  FMW:[id number] DOB: 01-09-1956 DOA: 02/19/2024 PCP: Katrinka Aquas, MD   Brief Narrative:    Rose Greer is a 68 y.o. female with medical history significant of hypertension, hyperlipidemia, type 2 diabetes mellitus, brain aneurysm, tobacco abuse who presents to the emergency department due to 1 week onset of shortness of breath that rapidly progressed about 3 days ago and worsened on the morning of admission.  Patient was admitted for acute hypoxemic respiratory failure in the setting of acute on chronic diastolic CHF exacerbation and has been started on IV Lasix  with Cardiology following and echo pending.  Assessment & Plan:   Principal Problem:   Acute exacerbation of CHF (congestive heart failure) (HCC)  Assessment and Plan:   Acute hypoxemic respiratory failure secondary to acute on chronic diastolic CHF exacerbation Patient does not wear oxygen at baseline proBNP 6,565 Continue total input/output, daily weights and fluid restriction She was treated with IV Lasix  80 mg x 1.  Continue IV Lasix  40 mg twice daily Continue Coreg , Entresto Continue heart healthy and carb modified diet           Echocardiogram done in July 2018 showed LVEF of 50% G1 DD.  Echocardiogram pending Appreciate Cardiology evaluation   Transaminasemia-resolved This is possibly due to hepatic congestion   Type 2 diabetes mellitus with hyperglycemia-improved Continue Semglee 10 units nightly and adjust dose accordingly Continue ISS and hypoglycemia protocol Metformin will be held at this time   Elevated troponin possibly secondary to type II demand ischemia Troponin 22 > 36, patient denies chest pain.  Continue to trend troponin   Prolonged QT interval QTc 523 ms Avoid QT prolonging drugs Magnesium level will be checked Repeat EKG in the morning   GERD Continue Protonix    Mixed hyperlipidemia Continue pravastatin    Tobacco abuse Patient was counseled on  tobacco abuse cessation   History of CVA Continue Plavix , pravastatin     DVT prophylaxis:Lovenox  Code Status: Full Family Communication: None at bedside Disposition Plan:  Status is: Inpatient Remains inpatient appropriate because: Need for IV medications.   Consultants:  Cardiology  Procedures:  None  Antimicrobials:  None   Subjective: Patient seen and evaluated today with ongoing dyspnea noted. No acute concerns or events noted overnight.  Objective: Vitals:   02/20/24 0028 02/20/24 0436 02/20/24 0500 02/20/24 0544  BP: (!) 179/102   (!) 146/71  Pulse: 86   73  Resp: 18   18  Temp: 98.3 F (36.8 C)   97.9 F (36.6 C)  TempSrc: Oral   Oral  SpO2: 99% 99%  99%  Weight:   53.5 kg   Height:        Intake/Output Summary (Last 24 hours) at 02/20/2024 1317 Last data filed at 02/20/2024 0655 Gross per 24 hour  Intake 120 ml  Output 150 ml  Net -30 ml   Filed Weights   02/19/24 2007 02/20/24 0500  Weight: 53.7 kg 53.5 kg    Examination:  General exam: Appears calm and comfortable  Respiratory system: Clear to auscultation. Respiratory effort normal.  2 L nasal cannula Cardiovascular system: S1 & S2 heard, RRR.  Gastrointestinal system: Abdomen is soft Central nervous system: Alert and awake Extremities: No edema Skin: No significant lesions noted Psychiatry: Flat affect.    Data Reviewed: I have personally reviewed following labs and imaging studies  CBC: Recent Labs  Lab 02/19/24 2015 02/20/24 0453  WBC 10.4 8.4  HGB 12.6 12.2  HCT 40.3 38.9  MCV 85.6 84.6  PLT 188 183   Basic Metabolic Panel: Recent Labs  Lab 02/19/24 2015 02/20/24 0453  NA 137 139  K 4.0 3.9  CL 103 101  CO2 20* 25  GLUCOSE 178* 98  BUN 13 14  CREATININE 0.61 0.80  CALCIUM  8.7* 8.9  MG 1.8 2.0  PHOS  --  3.7   GFR: Estimated Creatinine Clearance: 54.9 mL/min (by C-G formula based on SCr of 0.8 mg/dL). Liver Function Tests: Recent Labs  Lab 02/19/24 2015  02/20/24 0453  AST 59* 51*  ALT 47* 44  ALKPHOS 105 98  BILITOT 0.7 1.0  PROT 7.4 7.2  ALBUMIN 4.3 4.3   No results for input(s): LIPASE, AMYLASE in the last 168 hours. No results for input(s): AMMONIA in the last 168 hours. Coagulation Profile: No results for input(s): INR, PROTIME in the last 168 hours. Cardiac Enzymes: No results for input(s): CKTOTAL, CKMB, CKMBINDEX, TROPONINI in the last 168 hours. BNP (last 3 results) Recent Labs    02/19/24 2015  PROBNP 6,565.0*   HbA1C: Recent Labs    02/19/24 2015  HGBA1C 5.9*   CBG: Recent Labs  Lab 02/20/24 0759 02/20/24 1206  GLUCAP 99 107*   Lipid Profile: No results for input(s): CHOL, HDL, LDLCALC, TRIG, CHOLHDL, LDLDIRECT in the last 72 hours. Thyroid  Function Tests: No results for input(s): TSH, T4TOTAL, FREET4, T3FREE, THYROIDAB in the last 72 hours. Anemia Panel: No results for input(s): VITAMINB12, FOLATE, FERRITIN, TIBC, IRON, RETICCTPCT in the last 72 hours. Sepsis Labs: No results for input(s): PROCALCITON, LATICACIDVEN in the last 168 hours.  No results found for this or any previous visit (from the past 240 hours).       Radiology Studies: ECHOCARDIOGRAM COMPLETE Result Date: 02/20/2024    ECHOCARDIOGRAM REPORT   Patient Name:   Rose Greer Date of Exam: 02/20/2024 Medical Rec #:  [id number]    Height:       63.0 in Accession #:    [id number]   Weight:       117.9 lb Date of Birth:  [birth year]   BSA:          1.545 m Patient Age:    57 years     BP:           146/71 mmHg Patient Gender: F            HR:           62 bpm. Exam Location:  [facility] Procedure: 2D Echo, Color Doppler and Cardiac Doppler (Both Spectral and Color            Flow Doppler were utilized during procedure). Indications:    CHF I50.31  History:        Patient has no prior history of Echocardiogram examinations.                 CHF; Risk Factors:Hypertension, Dyslipidemia and  Diabetes.  Sonographer:    Tinnie Gosling RDCS Referring Phys: [id number] OLADAPO ADEFESO IMPRESSIONS  1. Left ventricular ejection fraction, by estimation, is 40%. The left ventricle has mildly decreased function. The left ventricle demonstrates global hypokinesis. There is mild left ventricular hypertrophy. Left ventricular diastolic parameters are indeterminate.  2. Right ventricular systolic function is normal. The right ventricular size is normal. There is mildly elevated pulmonary artery systolic pressure.  3. The mitral valve is abnormal. Mild mitral valve regurgitation. No evidence of mitral stenosis.  4. The tricuspid valve is abnormal.  5. The  aortic valve is tricuspid. Aortic valve regurgitation is not visualized. No aortic stenosis is present.  6. The inferior vena cava is normal in size with greater than 50% respiratory variability, suggesting right atrial pressure of 3 mmHg. FINDINGS  Left Ventricle: Left ventricular ejection fraction, by estimation, is 40%. The left ventricle has mildly decreased function. The left ventricle demonstrates global hypokinesis. The left ventricular internal cavity size was normal in size. There is mild left ventricular hypertrophy. Left ventricular diastolic parameters are indeterminate. Right Ventricle: The right ventricular size is normal. Right vetricular wall thickness was not well visualized. Right ventricular systolic function is normal. There is mildly elevated pulmonary artery systolic pressure. The tricuspid regurgitant velocity  is 2.95 m/s, and with an assumed right atrial pressure of 3 mmHg, the estimated right ventricular systolic pressure is 37.8 mmHg. Left Atrium: Left atrial size was normal in size. Right Atrium: Right atrial size was normal in size. Pericardium: There is no evidence of pericardial effusion. Mitral Valve: The mitral valve is abnormal. Mild mitral valve regurgitation. No evidence of mitral valve stenosis. Tricuspid Valve: The tricuspid valve is  abnormal. Tricuspid valve regurgitation is mild . No evidence of tricuspid stenosis. Aortic Valve: The aortic valve is tricuspid. Aortic valve regurgitation is not visualized. No aortic stenosis is present. Aortic valve mean gradient measures 2.5 mmHg. Aortic valve peak gradient measures 4.8 mmHg. Aortic valve area, by VTI measures 1.83 cm. Pulmonic Valve: The pulmonic valve was not well visualized. Pulmonic valve regurgitation is not visualized. No evidence of pulmonic stenosis. Aorta: The aortic root and ascending aorta are structurally normal, with no evidence of dilitation. Venous: The inferior vena cava is normal in size with greater than 50% respiratory variability, suggesting right atrial pressure of 3 mmHg. IAS/Shunts: No atrial level shunt detected by color flow Doppler.  LEFT VENTRICLE PLAX 2D LVIDd:         4.60 cm      Diastology LVIDs:         3.70 cm      LV e' medial:    4.57 cm/s LV PW:         1.10 cm      LV E/e' medial:  17.9 LV IVS:        1.10 cm      LV e' lateral:   9.36 cm/s LVOT diam:     2.00 cm      LV E/e' lateral: 8.7 LV SV:         38 LV SV Index:   25 LVOT Area:     3.14 cm  LV Volumes (MOD) LV vol d, MOD A2C: 115.0 ml LV vol d, MOD A4C: 82.2 ml LV vol s, MOD A2C: 65.5 ml LV vol s, MOD A4C: 48.4 ml LV SV MOD A2C:     49.5 ml LV SV MOD A4C:     82.2 ml LV SV MOD BP:      42.7 ml RIGHT VENTRICLE            IVC RV S prime:     9.14 cm/s  IVC diam: 1.20 cm TAPSE (M-mode): 1.3 cm                            PULMONARY VEINS                            Diastolic Velocity: 35.70 cm/s  S/D Velocity:       0.80                            Systolic Velocity:  29.70 cm/s LEFT ATRIUM             Index        RIGHT ATRIUM           Index LA diam:        3.60 cm 2.33 cm/m   RA Area:     11.80 cm LA Vol (A2C):   50.7 ml 32.82 ml/m  RA Volume:   25.60 ml  16.57 ml/m LA Vol (A4C):   46.8 ml 30.29 ml/m LA Biplane Vol: 49.8 ml 32.23 ml/m  AORTIC VALVE AV Area (Vmax):    2.00  cm AV Area (Vmean):   1.85 cm AV Area (VTI):     1.83 cm AV Vmax:           109.08 cm/s AV Vmean:          75.781 cm/s AV VTI:            0.208 m AV Peak Grad:      4.8 mmHg AV Mean Grad:      2.5 mmHg LVOT Vmax:         69.50 cm/s LVOT Vmean:        44.700 cm/s LVOT VTI:          0.121 m LVOT/AV VTI ratio: 0.58  AORTA Ao Root diam: 2.60 cm Ao Asc diam:  3.20 cm MITRAL VALVE               TRICUSPID VALVE MV Area (PHT): 5.34 cm    TR Peak grad:   34.8 mmHg MV Decel Time: 142 msec    TR Vmax:        295.00 cm/s MV E velocity: 81.80 cm/s MV A velocity: 27.30 cm/s  SHUNTS MV E/A ratio:  3.00        Systemic VTI:  0.12 m                            Systemic Diam: 2.00 cm Dorn Ross MD Electronically signed by Dorn Ross MD Signature Date/Time: 02/20/2024/12:02:17 PM    Final    DG Chest Portable 1 View Result Date: 02/19/2024 CLINICAL DATA:  Shortness of breath. EXAM: PORTABLE CHEST 1 VIEW COMPARISON:  December 14, 2019 FINDINGS: The heart size and mediastinal contours are within normal limits. There is moderate severity calcification of the aortic arch. Moderate severity diffusely increased interstitial lung markings are seen. Mild atelectasis and/or infiltrate is also noted within the retrocardiac region of the left lung base. No pleural effusion or pneumothorax is identified. The visualized skeletal structures are unremarkable. IMPRESSION: 1. Moderate severity interstitial edema. 2. Mild left basilar atelectasis and/or infiltrate. Electronically Signed   By: Suzen Dials M.D.   On: 02/19/2024 21:22        Scheduled Meds:  calcium  carbonate  1 tablet Oral Q breakfast   enoxaparin  (LOVENOX ) injection  40 mg Subcutaneous Q24H   furosemide   40 mg Intravenous Q12H   insulin  aspart  0-5 Units Subcutaneous QHS   insulin  aspart  0-9 Units Subcutaneous TID WC   insulin  glargine-yfgn  10 Units Subcutaneous QHS   irbesartan  150 mg Oral Daily   metoprolol  succinate  25 mg Oral Daily  pantoprazole   40 mg Oral QAC breakfast      LOS: 1 day    Time spent: 55 minutes    Vinette Crites JONETTA Fairly, DO [facility]  If 7PM-7AM, please contact night-coverage [email or web address] 02/20/2024, 1:17 PM

## 2024-02-20 NOTE — Plan of Care (Signed)
  Problem: Education: Goal: Knowledge of General Education information will improve Description: Including pain rating scale, medication(s)/side effects and non-pharmacologic comfort measures Outcome: Progressing   Problem: Health Behavior/Discharge Planning: Goal: Ability to manage health-related needs will improve Outcome: Progressing   Problem: Clinical Measurements: Goal: Ability to maintain clinical measurements within normal limits will improve Outcome: Progressing Goal: Will remain free from infection Outcome: Progressing Goal: Diagnostic test results will improve Outcome: Progressing Goal: Respiratory complications will improve Outcome: Progressing Goal: Cardiovascular complication will be avoided Outcome: Progressing   Problem: Activity: Goal: Risk for activity intolerance will decrease Outcome: Progressing   Problem: Nutrition: Goal: Adequate nutrition will be maintained Outcome: Progressing   Problem: Coping: Goal: Level of anxiety will decrease Outcome: Progressing   Problem: Elimination: Goal: Will not experience complications related to bowel motility Outcome: Progressing Goal: Will not experience complications related to urinary retention Outcome: Progressing   Problem: Pain Managment: Goal: General experience of comfort will improve and/or be controlled Outcome: Progressing   Problem: Safety: Goal: Ability to remain free from injury will improve Outcome: Progressing   Problem: Skin Integrity: Goal: Risk for impaired skin integrity will decrease Outcome: Progressing   Problem: Education: Goal: Ability to describe self-care measures that may prevent or decrease complications (Diabetes Survival Skills Education) will improve Outcome: Progressing Goal: Individualized Educational Video(s) Outcome: Progressing   Problem: Coping: Goal: Ability to adjust to condition or change in health will improve Outcome: Progressing   Problem: Fluid  Volume: Goal: Ability to maintain a balanced intake and output will improve Outcome: Progressing   Problem: Health Behavior/Discharge Planning: Goal: Ability to identify and utilize available resources and services will improve Outcome: Progressing Goal: Ability to manage health-related needs will improve Outcome: Progressing   Problem: Metabolic: Goal: Ability to maintain appropriate glucose levels will improve Outcome: Progressing   Problem: Skin Integrity: Goal: Risk for impaired skin integrity will decrease Outcome: Progressing   Problem: Tissue Perfusion: Goal: Adequacy of tissue perfusion will improve Outcome: Progressing   Problem: Education: Goal: Ability to demonstrate management of disease process will improve Outcome: Progressing Goal: Ability to verbalize understanding of medication therapies will improve Outcome: Progressing Goal: Individualized Educational Video(s) Outcome: Progressing   Problem: Activity: Goal: Capacity to carry out activities will improve Outcome: Progressing   Problem: Cardiac: Goal: Ability to achieve and maintain adequate cardiopulmonary perfusion will improve Outcome: Progressing

## 2024-02-21 ENCOUNTER — Telehealth (HOSPITAL_COMMUNITY): Payer: Self-pay | Admitting: Pharmacy Technician

## 2024-02-21 ENCOUNTER — Other Ambulatory Visit (HOSPITAL_COMMUNITY): Payer: Self-pay

## 2024-02-21 DIAGNOSIS — I10 Essential (primary) hypertension: Secondary | ICD-10-CM

## 2024-02-21 DIAGNOSIS — I5033 Acute on chronic diastolic (congestive) heart failure: Secondary | ICD-10-CM | POA: Diagnosis not present

## 2024-02-21 LAB — MISC LABCORP TEST (SEND OUT): Labcorp test code: 83935

## 2024-02-21 LAB — GLUCOSE, CAPILLARY
Glucose-Capillary: 98 mg/dL (ref 70–99)
Glucose-Capillary: 119 mg/dL — ABNORMAL HIGH (ref 70–99)
Glucose-Capillary: 112 mg/dL — ABNORMAL HIGH (ref 70–99)
Glucose-Capillary: 85 mg/dL (ref 70–99)

## 2024-02-21 LAB — BASIC METABOLIC PANEL WITH GFR
Anion gap: 13 (ref 5–15)
BUN: 20 mg/dL (ref 8–23)
CO2: 26 mmol/L (ref 22–32)
Calcium: 8.8 mg/dL — ABNORMAL LOW (ref 8.9–10.3)
Chloride: 98 mmol/L (ref 98–111)
Creatinine, Ser: 0.88 mg/dL (ref 0.44–1.00)
GFR, Estimated: >60 mL/min (ref >60)
Glucose, Bld: 77 mg/dL (ref 70–99)
Potassium: 3.7 mmol/L (ref 3.5–5.1)
Sodium: 137 mmol/L (ref 135–145)

## 2024-02-21 LAB — LIPID PANEL
Cholesterol: 221 mg/dL — ABNORMAL HIGH (ref 0–200)
HDL: 40 mg/dL — ABNORMAL LOW (ref >40)
LDL Cholesterol: 158 mg/dL — ABNORMAL HIGH (ref 0–99)
Total CHOL/HDL Ratio: 5.6 ratio
Triglycerides: 117 mg/dL (ref <150)
VLDL: 23 mg/dL (ref 0–40)

## 2024-02-21 LAB — CBC
HCT: 37.9 % (ref 36.0–46.0)
Hemoglobin: 12.1 g/dL (ref 12.0–15.0)
MCH: 27.1 pg (ref 26.0–34.0)
MCHC: 31.9 g/dL (ref 30.0–36.0)
MCV: 84.8 fL (ref 80.0–100.0)
Platelets: 183 K/uL (ref 150–400)
RBC: 4.47 MIL/uL (ref 3.87–5.11)
RDW: 15.3 % (ref 11.5–15.5)
WBC: 7.7 K/uL (ref 4.0–10.5)
nRBC: 0 % (ref 0.0–0.2)

## 2024-02-21 LAB — MAGNESIUM: Magnesium: 2.4 mg/dL (ref 1.7–2.4)

## 2024-02-21 MED ORDER — SPIRONOLACTONE 12.5 MG HALF TABLET
12.5000 mg | ORAL_TABLET | Freq: Every day | ORAL | Status: DC
  Administered 2024-02-21 – 2024-02-23 (×3): 12.5 mg via ORAL
  Filled 2024-02-21 (×3): qty 1

## 2024-02-21 MED ORDER — SACUBITRIL-VALSARTAN 24-26 MG PO TABS
1.0000 | ORAL_TABLET | Freq: Two times a day (BID) | ORAL | Status: DC
  Administered 2024-02-22 – 2024-02-23 (×2): 1 via ORAL
  Filled 2024-02-21 (×2): qty 1

## 2024-02-21 MED ORDER — POLYETHYLENE GLYCOL 3350 17 G PO PACK
17.0000 g | PACK | Freq: Every day | ORAL | Status: DC
  Administered 2024-02-21 – 2024-02-22 (×2): 17 g via ORAL
  Filled 2024-02-21 (×3): qty 1

## 2024-02-21 NOTE — Progress Notes (Signed)
 Rounding Note   Patient Name: Rose Greer Date of Encounter: 02/21/2024  [facility] Cardiologist: New  Subjective SOB improving.   Scheduled Meds:  calcium  carbonate  1 tablet Oral Q breakfast   enoxaparin  (LOVENOX ) injection  40 mg Subcutaneous Q24H   furosemide   40 mg Intravenous Q12H   insulin  aspart  0-5 Units Subcutaneous QHS   insulin  aspart  0-9 Units Subcutaneous TID WC   insulin  glargine-yfgn  10 Units Subcutaneous QHS   irbesartan  150 mg Oral Daily   metoprolol  succinate  25 mg Oral Daily   pantoprazole   40 mg Oral QAC breakfast   Continuous Infusions:  PRN Meds: acetaminophen  **OR** acetaminophen , mouth rinse, prochlorperazine   Vital Signs  Vitals:   02/20/24 1441 02/20/24 1900 02/21/24 0500 02/21/24 1000  BP: 114/68 (!) 115/59  (!) 115/56  Pulse: 67 87  66  Resp: 18 16  18   Temp: 99.1 F (37.3 C) 98.2 F (36.8 C)  98.5 F (36.9 C)  TempSrc: Oral Oral  Oral  SpO2: 100% 100%  100%  Weight:   44.7 kg   Height:        Intake/Output Summary (Last 24 hours) at 02/21/2024 1054 Last data filed at 02/21/2024 0500 Gross per 24 hour  Intake 0 ml  Output 1400 ml  Net -1400 ml      02/21/2024    5:00 AM 02/20/2024    1:34 PM 02/20/2024    5:00 AM  Last 3 Weights  Weight (lbs) 98 lb 8.7 oz 101 lb 12.8 oz 117 lb 15.1 oz  Weight (kg) 44.7 kg 46.176 kg 53.5 kg      Telemetry NSR - Personally Reviewed  ECG  N/a - Personally Reviewed  Physical Exam  GEN: No acute distress.   Neck: No JVD Cardiac: RRR, no murmurs, rubs, or gallops.  Respiratory: Clear to auscultation bilaterally. GI: Soft, nontender, non-distended  MS: No edema; No deformity. Neuro:  Nonfocal  Psych: Normal affect   Labs High Sensitivity Troponin:  No results for input(s): TROPONINIHS in the last 720 hours.   Chemistry Recent Labs  Lab 02/19/24 2015 02/20/24 0453 02/21/24 0423  NA 137 139 137  K 4.0 3.9 3.7  CL 103 101 98  CO2 20* 25 26  GLUCOSE 178*  98 77  BUN 13 14 20   CREATININE 0.61 0.80 0.88  CALCIUM  8.7* 8.9 8.8*  MG 1.8 2.0 2.4  PROT 7.4 7.2  --   ALBUMIN 4.3 4.3  --   AST 59* 51*  --   ALT 47* 44  --   ALKPHOS 105 98  --   BILITOT 0.7 1.0  --   GFRNONAA >60 >60 >60  ANIONGAP 14 13 13     Lipids  Recent Labs  Lab 02/21/24 0423  CHOL 221*  TRIG 117  HDL 40*  LDLCALC 158*  CHOLHDL 5.6    Hematology Recent Labs  Lab 02/19/24 2015 02/20/24 0453 02/21/24 0423  WBC 10.4 8.4 7.7  RBC 4.71 4.60 4.47  HGB 12.6 12.2 12.1  HCT 40.3 38.9 37.9  MCV 85.6 84.6 84.8  MCH 26.8 26.5 27.1  MCHC 31.3 31.4 31.9  RDW 15.7* 15.6* 15.3  PLT 188 183 183   Thyroid  No results for input(s): TSH, FREET4 in the last 168 hours.  BNP Recent Labs  Lab 02/19/24 2015  PROBNP 6,565.0*    DDimer No results for input(s): DDIMER in the last 168 hours.   Radiology  ECHOCARDIOGRAM COMPLETE Result Date:  02/20/2024    ECHOCARDIOGRAM REPORT   Patient Name:   Rose Greer Date of Exam: 02/20/2024 Medical Rec #:  [id number]    Height:       63.0 in Accession #:    [id number]   Weight:       117.9 lb Date of Birth:  1956/02/28   BSA:          1.545 m Patient Age:    68 years     BP:           146/71 mmHg Patient Gender: F            HR:           62 bpm. Exam Location:  [facility] Procedure: 2D Echo, Color Doppler and Cardiac Doppler (Both Spectral and Color            Flow Doppler were utilized during procedure). Indications:    CHF I50.31  History:        Patient has no prior history of Echocardiogram examinations.                 CHF; Risk Factors:Hypertension, Dyslipidemia and Diabetes.  Sonographer:    Tinnie Gosling RDCS Referring Phys: [id number] OLADAPO ADEFESO IMPRESSIONS  1. Left ventricular ejection fraction, by estimation, is 40%. The left ventricle has mildly decreased function. The left ventricle demonstrates global hypokinesis. There is mild left ventricular hypertrophy. Left ventricular diastolic parameters are indeterminate.  2.  Right ventricular systolic function is normal. The right ventricular size is normal. There is mildly elevated pulmonary artery systolic pressure.  3. The mitral valve is abnormal. Mild mitral valve regurgitation. No evidence of mitral stenosis.  4. The tricuspid valve is abnormal.  5. The aortic valve is tricuspid. Aortic valve regurgitation is not visualized. No aortic stenosis is present.  6. The inferior vena cava is normal in size with greater than 50% respiratory variability, suggesting right atrial pressure of 3 mmHg. FINDINGS  Left Ventricle: Left ventricular ejection fraction, by estimation, is 40%. The left ventricle has mildly decreased function. The left ventricle demonstrates global hypokinesis. The left ventricular internal cavity size was normal in size. There is mild left ventricular hypertrophy. Left ventricular diastolic parameters are indeterminate. Right Ventricle: The right ventricular size is normal. Right vetricular wall thickness was not well visualized. Right ventricular systolic function is normal. There is mildly elevated pulmonary artery systolic pressure. The tricuspid regurgitant velocity  is 2.95 m/s, and with an assumed right atrial pressure of 3 mmHg, the estimated right ventricular systolic pressure is 37.8 mmHg. Left Atrium: Left atrial size was normal in size. Right Atrium: Right atrial size was normal in size. Pericardium: There is no evidence of pericardial effusion. Mitral Valve: The mitral valve is abnormal. Mild mitral valve regurgitation. No evidence of mitral valve stenosis. Tricuspid Valve: The tricuspid valve is abnormal. Tricuspid valve regurgitation is mild . No evidence of tricuspid stenosis. Aortic Valve: The aortic valve is tricuspid. Aortic valve regurgitation is not visualized. No aortic stenosis is present. Aortic valve mean gradient measures 2.5 mmHg. Aortic valve peak gradient measures 4.8 mmHg. Aortic valve area, by VTI measures 1.83 cm. Pulmonic Valve: The  pulmonic valve was not well visualized. Pulmonic valve regurgitation is not visualized. No evidence of pulmonic stenosis. Aorta: The aortic root and ascending aorta are structurally normal, with no evidence of dilitation. Venous: The inferior vena cava is normal in size with greater than 50% respiratory variability, suggesting right atrial pressure of 3 mmHg.  IAS/Shunts: No atrial level shunt detected by color flow Doppler.  LEFT VENTRICLE PLAX 2D LVIDd:         4.60 cm      Diastology LVIDs:         3.70 cm      LV e' medial:    4.57 cm/s LV PW:         1.10 cm      LV E/e' medial:  17.9 LV IVS:        1.10 cm      LV e' lateral:   9.36 cm/s LVOT diam:     2.00 cm      LV E/e' lateral: 8.7 LV SV:         38 LV SV Index:   25 LVOT Area:     3.14 cm  LV Volumes (MOD) LV vol d, MOD A2C: 115.0 ml LV vol d, MOD A4C: 82.2 ml LV vol s, MOD A2C: 65.5 ml LV vol s, MOD A4C: 48.4 ml LV SV MOD A2C:     49.5 ml LV SV MOD A4C:     82.2 ml LV SV MOD BP:      42.7 ml RIGHT VENTRICLE            IVC RV S prime:     9.14 cm/s  IVC diam: 1.20 cm TAPSE (M-mode): 1.3 cm                            PULMONARY VEINS                            Diastolic Velocity: 35.70 cm/s                            S/D Velocity:       0.80                            Systolic Velocity:  29.70 cm/s LEFT ATRIUM             Index        RIGHT ATRIUM           Index LA diam:        3.60 cm 2.33 cm/m   RA Area:     11.80 cm LA Vol (A2C):   50.7 ml 32.82 ml/m  RA Volume:   25.60 ml  16.57 ml/m LA Vol (A4C):   46.8 ml 30.29 ml/m LA Biplane Vol: 49.8 ml 32.23 ml/m  AORTIC VALVE AV Area (Vmax):    2.00 cm AV Area (Vmean):   1.85 cm AV Area (VTI):     1.83 cm AV Vmax:           109.08 cm/s AV Vmean:          75.781 cm/s AV VTI:            0.208 m AV Peak Grad:      4.8 mmHg AV Mean Grad:      2.5 mmHg LVOT Vmax:         69.50 cm/s LVOT Vmean:        44.700 cm/s LVOT VTI:          0.121 m LVOT/AV VTI ratio: 0.58  AORTA Ao Root diam: 2.60 cm Ao Asc diam:  3.20 cm MITRAL VALVE               TRICUSPID VALVE MV Area (PHT): 5.34 cm    TR Peak grad:   34.8 mmHg MV Decel Time: 142 msec    TR Vmax:        295.00 cm/s MV E velocity: 81.80 cm/s MV A velocity: 27.30 cm/s  SHUNTS MV E/A ratio:  3.00        Systemic VTI:  0.12 m                            Systemic Diam: 2.00 cm Dorn Ross MD Electronically signed by Dorn Ross MD Signature Date/Time: 02/20/2024/12:02:17 PM    Final    DG Chest Portable 1 View Result Date: 02/19/2024 CLINICAL DATA:  Shortness of breath. EXAM: PORTABLE CHEST 1 VIEW COMPARISON:  December 14, 2019 FINDINGS: The heart size and mediastinal contours are within normal limits. There is moderate severity calcification of the aortic arch. Moderate severity diffusely increased interstitial lung markings are seen. Mild atelectasis and/or infiltrate is also noted within the retrocardiac region of the left lung base. No pleural effusion or pneumothorax is identified. The visualized skeletal structures are unremarkable. IMPRESSION: 1. Moderate severity interstitial edema. 2. Mild left basilar atelectasis and/or infiltrate. Electronically Signed   By: Suzen Dials M.D.   On: 02/19/2024 21:22    Patient Profile   Rose Greer is a 68 y.o. female with a hx of HTN, HLD, DM2, brain aneurysm, CVA on Plavix , tobacco use who is being seen 02/20/2024 for the evaluation of HF at the request of Dr. Maree.    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 Incomplete I/Os. Initial bed weights appear inaccurate, by bed weights 102-->98 lbs over 24 hrs. Mild uptrend in Cr but WNL. Looks to be nearing euvolemia, hold lasix  after AM dose.   - continue toprol   25mg  daily. Change irbesartan  to entresto  24/26mg  bid starting tomorrow. Listed allergy to benazepril of confusion appears nonspecific, given LVEF 40% would see if tolerates entresto . Add aldactone  12.5mg  daily, likely SGLT2i closer to discharge.   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 Off home bp meds x 3 years, presented with SBP in the 190s. I think ischemic testing at this time lower yield. Would treat medically, repeat LVE 3-6 months, if ongoing dysfunction consider ischemic testing at that time.    2.Elevated troponin - mild and flat in the setting of severe HTN, hypoxia, HF - at this time do not suspect ACS - TWIs on EKG are new since 2021 but in the setting of severe LVH. Given her bp on presentation would suspect poor compliance with bp meds, LVH likely has progressed since 2021 - she does confirm had not been on bp meds for about 3 years, just recently restarted per her report   3. HTN - reports had been off home bp meds for about 3 years, was just restarting prior to admission - bps at goal on toprol , irbesartan .    Prolonged QTc likely due to LVH. Avoid any prolonging agents.   For questions or updates, please  contact [facility] Please consult [email or web address] for contact info under       Signed, Alvan Carrier, MD  02/21/2024, 10:54 AM

## 2024-02-21 NOTE — Progress Notes (Signed)
 Pt with a BMI of 17.46. Did not complete Reds due to BMI outside of parameter guidelines

## 2024-02-21 NOTE — Telephone Encounter (Signed)
 Patient Product/process Development Scientist completed.    The patient is insured through [employer]. Patient has Medicare and is not eligible for a copay card, but may be able to apply for patient assistance or Medicare RX Payment Plan (Patient Must reach out to their plan, if eligible for payment plan), if available.    Ran test claim for sacubitril -valsartan  24-26 mg and the current 30 day co-pay is $0.00.   This test claim was processed through [facility]- copay amounts may vary at other pharmacies due to pharmacy/plan contracts, or as the patient moves through the different stages of their insurance plan.     Reyes Sharps, CPHT Pharmacy Technician Patient Advocate Specialist Lead [facility] Patient Advocate Team Direct Number: [phone]  Fax: [phone]

## 2024-02-21 NOTE — Progress Notes (Signed)
 PROGRESS NOTE    Rose Greer  FMW:[id number] DOB: May 26, 1955 DOA: 02/19/2024 PCP: Katrinka Aquas, MD   Brief Narrative:    Rose Greer is a 68 y.o. female with medical history significant of hypertension, hyperlipidemia, type 2 diabetes mellitus, brain aneurysm, tobacco abuse who presents to the emergency department due to 1 week onset of shortness of breath that rapidly progressed about 3 days ago and worsened on the morning of admission.  Patient was admitted for acute hypoxemic respiratory failure in the setting of acute on chronic diastolic CHF exacerbation and has been diuresed with IV Lasix  and 2D echocardiogram shows LVEF 40%.  She has been noted to have significant weight loss over the course of the year and TSH has been ordered and pending.  Assessment & Plan:   Principal Problem:   Acute exacerbation of CHF (congestive heart failure) (HCC)  Assessment and Plan:   Acute hypoxemic respiratory failure secondary to acute on chronic diastolic CHF exacerbation Patient does not wear oxygen at baseline proBNP 6,565 Continue total input/output, daily weights and fluid restriction She was diuresed with IV Lasix  which has now been held Continue Coreg , Entresto started per cardiology 11/19 Continue heart healthy and carb modified diet           Echocardiogram done in July 2018 showed LVEF of 50% G1 DD.  Echocardiogram with LVEF 40%   Transaminasemia-resolved This is possibly due to hepatic congestion   Type 2 diabetes mellitus with hyperglycemia-improved Continue Semglee 10 units nightly and adjust dose accordingly Continue ISS and hypoglycemia protocol Metformin will be held at this time   Elevated troponin possibly secondary to type II demand ischemia Troponin 22 > 36, patient denies chest pain.  Continue to trend troponin   Prolonged QT interval QTc 523 ms Avoid QT prolonging drugs Magnesium level will be checked Repeat EKG in the morning Compazine discontinued    GERD Continue Protonix    Mixed hyperlipidemia Continue pravastatin    Tobacco abuse Patient was counseled on tobacco abuse cessation   History of CVA Continue Plavix , pravastatin   Significant weight loss Noted to have lost approximately 50 pounds since 05/2023 TSH ordered for further evaluation    DVT prophylaxis:Lovenox  Code Status: Full Family Communication: None at bedside Disposition Plan:  Status is: Inpatient Remains inpatient appropriate because: Need for IV medications.   Consultants:  Cardiology  Procedures:  None  Antimicrobials:  None   Subjective: Patient seen and evaluated today with minimal dyspnea that overall appears improved.  She appears depressed and has discussing how she has had significant weight loss since earlier this year of approximately 50 pounds.  Objective: Vitals:   02/20/24 1441 02/20/24 1900 02/21/24 0500 02/21/24 1000  BP: 114/68 (!) 115/59  (!) 115/56  Pulse: 67 87  66  Resp: 18 16  18   Temp: 99.1 F (37.3 C) 98.2 F (36.8 C)  98.5 F (36.9 C)  TempSrc: Oral Oral  Oral  SpO2: 100% 100%  100%  Weight:   44.7 kg   Height:        Intake/Output Summary (Last 24 hours) at 02/21/2024 1138 Last data filed at 02/21/2024 0500 Gross per 24 hour  Intake 0 ml  Output 1400 ml  Net -1400 ml   Filed Weights   02/20/24 0500 02/20/24 1334 02/21/24 0500  Weight: 53.5 kg 46.2 kg 44.7 kg    Examination:  General exam: Appears calm and comfortable  Respiratory system: Clear to auscultation. Respiratory effort normal.  2 L nasal cannula Cardiovascular system:  S1 & S2 heard, RRR.  Gastrointestinal system: Abdomen is soft Central nervous system: Alert and awake Extremities: No edema Skin: No significant lesions noted Psychiatry: Flat affect.    Data Reviewed: I have personally reviewed following labs and imaging studies  CBC: Recent Labs  Lab 02/19/24 2015 02/20/24 0453 02/21/24 0423  WBC 10.4 8.4 7.7  HGB 12.6 12.2  12.1  HCT 40.3 38.9 37.9  MCV 85.6 84.6 84.8  PLT 188 183 183   Basic Metabolic Panel: Recent Labs  Lab 02/19/24 2015 02/20/24 0453 02/21/24 0423  NA 137 139 137  K 4.0 3.9 3.7  CL 103 101 98  CO2 20* 25 26  GLUCOSE 178* 98 77  BUN 13 14 20   CREATININE 0.61 0.80 0.88  CALCIUM  8.7* 8.9 8.8*  MG 1.8 2.0 2.4  PHOS  --  3.7  --    GFR: Estimated Creatinine Clearance: 42.6 mL/min (by C-G formula based on SCr of 0.88 mg/dL). Liver Function Tests: Recent Labs  Lab 02/19/24 2015 02/20/24 0453  AST 59* 51*  ALT 47* 44  ALKPHOS 105 98  BILITOT 0.7 1.0  PROT 7.4 7.2  ALBUMIN 4.3 4.3   No results for input(s): LIPASE, AMYLASE in the last 168 hours. No results for input(s): AMMONIA in the last 168 hours. Coagulation Profile: No results for input(s): INR, PROTIME in the last 168 hours. Cardiac Enzymes: No results for input(s): CKTOTAL, CKMB, CKMBINDEX, TROPONINI in the last 168 hours. BNP (last 3 results) Recent Labs    02/19/24 2015  PROBNP 6,565.0*   HbA1C: Recent Labs    02/19/24 2015  HGBA1C 5.9*   CBG: Recent Labs  Lab 02/20/24 1206 02/20/24 1644 02/20/24 2043 02/21/24 0740 02/21/24 1103  GLUCAP 107* 107* 110* 98 119*   Lipid Profile: Recent Labs    02/21/24 0423  CHOL 221*  HDL 40*  LDLCALC 158*  TRIG 117  CHOLHDL 5.6   Thyroid  Function Tests: No results for input(s): TSH, T4TOTAL, FREET4, T3FREE, THYROIDAB in the last 72 hours. Anemia Panel: No results for input(s): VITAMINB12, FOLATE, FERRITIN, TIBC, IRON, RETICCTPCT in the last 72 hours. Sepsis Labs: No results for input(s): PROCALCITON, LATICACIDVEN in the last 168 hours.  No results found for this or any previous visit (from the past 240 hours).       Radiology Studies: ECHOCARDIOGRAM COMPLETE Result Date: 02/20/2024    ECHOCARDIOGRAM REPORT   Patient Name:   Rose Greer Date of Exam: 02/20/2024 Medical Rec #:  [id number]    Height:        63.0 in Accession #:    [id number]   Weight:       117.9 lb Date of Birth:  1955/04/19   BSA:          1.545 m Patient Age:    59 years     BP:           146/71 mmHg Patient Gender: F            HR:           62 bpm. Exam Location:  [facility] Procedure: 2D Echo, Color Doppler and Cardiac Doppler (Both Spectral and Color            Flow Doppler were utilized during procedure). Indications:    CHF I50.31  History:        Patient has no prior history of Echocardiogram examinations.  CHF; Risk Factors:Hypertension, Dyslipidemia and Diabetes.  Sonographer:    Tinnie Gosling RDCS Referring Phys: [id number] OLADAPO ADEFESO IMPRESSIONS  1. Left ventricular ejection fraction, by estimation, is 40%. The left ventricle has mildly decreased function. The left ventricle demonstrates global hypokinesis. There is mild left ventricular hypertrophy. Left ventricular diastolic parameters are indeterminate.  2. Right ventricular systolic function is normal. The right ventricular size is normal. There is mildly elevated pulmonary artery systolic pressure.  3. The mitral valve is abnormal. Mild mitral valve regurgitation. No evidence of mitral stenosis.  4. The tricuspid valve is abnormal.  5. The aortic valve is tricuspid. Aortic valve regurgitation is not visualized. No aortic stenosis is present.  6. The inferior vena cava is normal in size with greater than 50% respiratory variability, suggesting right atrial pressure of 3 mmHg. FINDINGS  Left Ventricle: Left ventricular ejection fraction, by estimation, is 40%. The left ventricle has mildly decreased function. The left ventricle demonstrates global hypokinesis. The left ventricular internal cavity size was normal in size. There is mild left ventricular hypertrophy. Left ventricular diastolic parameters are indeterminate. Right Ventricle: The right ventricular size is normal. Right vetricular wall thickness was not well visualized. Right ventricular systolic function  is normal. There is mildly elevated pulmonary artery systolic pressure. The tricuspid regurgitant velocity  is 2.95 m/s, and with an assumed right atrial pressure of 3 mmHg, the estimated right ventricular systolic pressure is 37.8 mmHg. Left Atrium: Left atrial size was normal in size. Right Atrium: Right atrial size was normal in size. Pericardium: There is no evidence of pericardial effusion. Mitral Valve: The mitral valve is abnormal. Mild mitral valve regurgitation. No evidence of mitral valve stenosis. Tricuspid Valve: The tricuspid valve is abnormal. Tricuspid valve regurgitation is mild . No evidence of tricuspid stenosis. Aortic Valve: The aortic valve is tricuspid. Aortic valve regurgitation is not visualized. No aortic stenosis is present. Aortic valve mean gradient measures 2.5 mmHg. Aortic valve peak gradient measures 4.8 mmHg. Aortic valve area, by VTI measures 1.83 cm. Pulmonic Valve: The pulmonic valve was not well visualized. Pulmonic valve regurgitation is not visualized. No evidence of pulmonic stenosis. Aorta: The aortic root and ascending aorta are structurally normal, with no evidence of dilitation. Venous: The inferior vena cava is normal in size with greater than 50% respiratory variability, suggesting right atrial pressure of 3 mmHg. IAS/Shunts: No atrial level shunt detected by color flow Doppler.  LEFT VENTRICLE PLAX 2D LVIDd:         4.60 cm      Diastology LVIDs:         3.70 cm      LV e' medial:    4.57 cm/s LV PW:         1.10 cm      LV E/e' medial:  17.9 LV IVS:        1.10 cm      LV e' lateral:   9.36 cm/s LVOT diam:     2.00 cm      LV E/e' lateral: 8.7 LV SV:         38 LV SV Index:   25 LVOT Area:     3.14 cm  LV Volumes (MOD) LV vol d, MOD A2C: 115.0 ml LV vol d, MOD A4C: 82.2 ml LV vol s, MOD A2C: 65.5 ml LV vol s, MOD A4C: 48.4 ml LV SV MOD A2C:     49.5 ml LV SV MOD A4C:     82.2 ml LV  SV MOD BP:      42.7 ml RIGHT VENTRICLE            IVC RV S prime:     9.14 cm/s  IVC  diam: 1.20 cm TAPSE (M-mode): 1.3 cm                            PULMONARY VEINS                            Diastolic Velocity: 35.70 cm/s                            S/D Velocity:       0.80                            Systolic Velocity:  29.70 cm/s LEFT ATRIUM             Index        RIGHT ATRIUM           Index LA diam:        3.60 cm 2.33 cm/m   RA Area:     11.80 cm LA Vol (A2C):   50.7 ml 32.82 ml/m  RA Volume:   25.60 ml  16.57 ml/m LA Vol (A4C):   46.8 ml 30.29 ml/m LA Biplane Vol: 49.8 ml 32.23 ml/m  AORTIC VALVE AV Area (Vmax):    2.00 cm AV Area (Vmean):   1.85 cm AV Area (VTI):     1.83 cm AV Vmax:           109.08 cm/s AV Vmean:          75.781 cm/s AV VTI:            0.208 m AV Peak Grad:      4.8 mmHg AV Mean Grad:      2.5 mmHg LVOT Vmax:         69.50 cm/s LVOT Vmean:        44.700 cm/s LVOT VTI:          0.121 m LVOT/AV VTI ratio: 0.58  AORTA Ao Root diam: 2.60 cm Ao Asc diam:  3.20 cm MITRAL VALVE               TRICUSPID VALVE MV Area (PHT): 5.34 cm    TR Peak grad:   34.8 mmHg MV Decel Time: 142 msec    TR Vmax:        295.00 cm/s MV E velocity: 81.80 cm/s MV A velocity: 27.30 cm/s  SHUNTS MV E/A ratio:  3.00        Systemic VTI:  0.12 m                            Systemic Diam: 2.00 cm Dorn Ross MD Electronically signed by Dorn Ross MD Signature Date/Time: 02/20/2024/12:02:17 PM    Final    DG Chest Portable 1 View Result Date: 02/19/2024 CLINICAL DATA:  Shortness of breath. EXAM: PORTABLE CHEST 1 VIEW COMPARISON:  December 14, 2019 FINDINGS: The heart size and mediastinal contours are within normal limits. There is moderate severity calcification of the aortic arch. Moderate severity diffusely increased interstitial lung markings are seen. Mild atelectasis and/or infiltrate is also noted within the  retrocardiac region of the left lung base. No pleural effusion or pneumothorax is identified. The visualized skeletal structures are unremarkable. IMPRESSION: 1. Moderate  severity interstitial edema. 2. Mild left basilar atelectasis and/or infiltrate. Electronically Signed   By: Suzen Dials M.D.   On: 02/19/2024 21:22        Scheduled Meds:  calcium  carbonate  1 tablet Oral Q breakfast   enoxaparin  (LOVENOX ) injection  40 mg Subcutaneous Q24H   insulin  aspart  0-5 Units Subcutaneous QHS   insulin  aspart  0-9 Units Subcutaneous TID WC   insulin  glargine-yfgn  10 Units Subcutaneous QHS   metoprolol  succinate  25 mg Oral Daily   pantoprazole   40 mg Oral QAC breakfast   [START ON 02/22/2024] sacubitril-valsartan  1 tablet Oral BID   spironolactone  12.5 mg Oral Daily      LOS: 2 days    Time spent: 55 minutes    Izola Teague JONETTA Fairly, DO [facility]  If 7PM-7AM, please contact night-coverage [email or web address] 02/21/2024, 11:38 AM

## 2024-02-21 NOTE — Plan of Care (Signed)

## 2024-02-21 NOTE — Plan of Care (Signed)
   Problem: Health Behavior/Discharge Planning: Goal: Ability to manage health-related needs will improve Outcome: Progressing

## 2024-02-22 DIAGNOSIS — I5021 Acute systolic (congestive) heart failure: Secondary | ICD-10-CM | POA: Diagnosis not present

## 2024-02-22 DIAGNOSIS — I5033 Acute on chronic diastolic (congestive) heart failure: Secondary | ICD-10-CM | POA: Diagnosis not present

## 2024-02-22 LAB — BASIC METABOLIC PANEL WITH GFR
Anion gap: 10 (ref 5–15)
BUN: 26 mg/dL — ABNORMAL HIGH (ref 8–23)
CO2: 29 mmol/L (ref 22–32)
Calcium: 9 mg/dL (ref 8.9–10.3)
Chloride: 100 mmol/L (ref 98–111)
Creatinine, Ser: 0.88 mg/dL (ref 0.44–1.00)
GFR, Estimated: >60 mL/min (ref >60)
Glucose, Bld: 112 mg/dL — ABNORMAL HIGH (ref 70–99)
Potassium: 3.6 mmol/L (ref 3.5–5.1)
Sodium: 139 mmol/L (ref 135–145)

## 2024-02-22 LAB — GLUCOSE, CAPILLARY
Glucose-Capillary: 114 mg/dL — ABNORMAL HIGH (ref 70–99)
Glucose-Capillary: 230 mg/dL — ABNORMAL HIGH (ref 70–99)
Glucose-Capillary: 128 mg/dL — ABNORMAL HIGH (ref 70–99)
Glucose-Capillary: 191 mg/dL — ABNORMAL HIGH (ref 70–99)

## 2024-02-22 LAB — T4, FREE: Free T4: 0.94 ng/dL (ref 0.61–1.12)

## 2024-02-22 LAB — MAGNESIUM: Magnesium: 2.3 mg/dL (ref 1.7–2.4)

## 2024-02-22 LAB — TSH: TSH: 0.391 u[IU]/mL (ref 0.350–4.500)

## 2024-02-22 MED ORDER — FUROSEMIDE 10 MG/ML IJ SOLN
60.0000 mg | Freq: Once | INTRAMUSCULAR | Status: AC
  Administered 2024-02-22: 60 mg via INTRAVENOUS
  Filled 2024-02-22: qty 6

## 2024-02-22 MED ORDER — EMPAGLIFLOZIN 10 MG PO TABS
10.0000 mg | ORAL_TABLET | Freq: Every day | ORAL | Status: DC
  Administered 2024-02-22 – 2024-02-23 (×2): 10 mg via ORAL
  Filled 2024-02-22 (×2): qty 1

## 2024-02-22 MED ORDER — GLUCERNA SHAKE PO LIQD
237.0000 mL | Freq: Three times a day (TID) | ORAL | Status: DC
  Administered 2024-02-22 – 2024-02-23 (×3): 237 mL via ORAL

## 2024-02-22 MED ORDER — BISACODYL 10 MG RE SUPP
10.0000 mg | Freq: Once | RECTAL | Status: AC
  Administered 2024-02-22: 10 mg via RECTAL
  Filled 2024-02-22: qty 1

## 2024-02-22 NOTE — Progress Notes (Signed)
 Rounding Note   Patient Name: Rose Greer Date of Encounter: 02/22/2024  [facility] Cardiologist: Debera  Subjective Breathing about 80% better, did have some orthopnea last night  Scheduled Meds:  calcium  carbonate  1 tablet Oral Q breakfast   enoxaparin  (LOVENOX ) injection  40 mg Subcutaneous Q24H   insulin  aspart  0-5 Units Subcutaneous QHS   insulin  aspart  0-9 Units Subcutaneous TID WC   insulin  glargine-yfgn  10 Units Subcutaneous QHS   metoprolol  succinate  25 mg Oral Daily   pantoprazole   40 mg Oral QAC breakfast   polyethylene glycol  17 g Oral Daily   sacubitril-valsartan  1 tablet Oral BID   spironolactone  12.5 mg Oral Daily   Continuous Infusions:  PRN Meds: acetaminophen  **OR** acetaminophen , mouth rinse   Vital Signs  Vitals:   02/21/24 1326 02/21/24 1957 02/22/24 0432 02/22/24 0436  BP: 112/65 117/68 112/64   Pulse: 66 63 70   Resp:  16 16   Temp: 98.4 F (36.9 C) 99.3 F (37.4 C) 98.2 F (36.8 C)   TempSrc: Oral Oral Oral   SpO2: 100% 99% 96%   Weight:    44.8 kg  Height:        Intake/Output Summary (Last 24 hours) at 02/22/2024 0840 Last data filed at 02/22/2024 0457 Gross per 24 hour  Intake 720 ml  Output 500 ml  Net 220 ml      02/22/2024    4:36 AM 02/21/2024    5:00 AM 02/20/2024    1:34 PM  Last 3 Weights  Weight (lbs) 98 lb 12.3 oz 98 lb 8.7 oz 101 lb 12.8 oz  Weight (kg) 44.8 kg 44.7 kg 46.176 kg      Telemetry NSR - Personally Reviewed  ECG  N/a - Personally Reviewed  Physical Exam  GEN: No acute distress.   Neck: No JVD Cardiac: RRR, no murmurs, rubs, or gallops.  Respiratory: Clear to auscultation bilaterally. GI: Soft, nontender, non-distended  MS: No edema; No deformity. Neuro:  Nonfocal  Psych: Normal affect   Labs High Sensitivity Troponin:  No results for input(s): TROPONINIHS in the last 720 hours.   Chemistry Recent Labs  Lab 02/19/24 2015 02/20/24 0453 02/21/24 0423  02/22/24 0428  NA 137 139 137 139  K 4.0 3.9 3.7 3.6  CL 103 101 98 100  CO2 20* 25 26 29   GLUCOSE 178* 98 77 112*  BUN 13 14 20  26*  CREATININE 0.61 0.80 0.88 0.88  CALCIUM  8.7* 8.9 8.8* 9.0  MG 1.8 2.0 2.4 2.3  PROT 7.4 7.2  --   --   ALBUMIN 4.3 4.3  --   --   AST 59* 51*  --   --   ALT 47* 44  --   --   ALKPHOS 105 98  --   --   BILITOT 0.7 1.0  --   --   GFRNONAA >60 >60 >60 >60  ANIONGAP 14 13 13 10     Lipids  Recent Labs  Lab 02/21/24 0423  CHOL 221*  TRIG 117  HDL 40*  LDLCALC 158*  CHOLHDL 5.6    Hematology Recent Labs  Lab 02/19/24 2015 02/20/24 0453 02/21/24 0423  WBC 10.4 8.4 7.7  RBC 4.71 4.60 4.47  HGB 12.6 12.2 12.1  HCT 40.3 38.9 37.9  MCV 85.6 84.6 84.8  MCH 26.8 26.5 27.1  MCHC 31.3 31.4 31.9  RDW 15.7* 15.6* 15.3  PLT 188 183 183   Thyroid   Recent Labs  Lab 02/22/24 0428  TSH 0.391    BNP Recent Labs  Lab 02/19/24 2015  PROBNP 6,565.0*    DDimer No results for input(s): DDIMER in the last 168 hours.   Radiology  ECHOCARDIOGRAM COMPLETE Result Date: 02/20/2024    ECHOCARDIOGRAM REPORT   Patient Name:   Rose Greer Date of Exam: 02/20/2024 Medical Rec #:  [id number]    Height:       63.0 in Accession #:    [id number]   Weight:       117.9 lb Date of Birth:  [birth year]   BSA:          1.545 m Patient Age:    68 years     BP:           146/71 mmHg Patient Gender: F            HR:           62 bpm. Exam Location:  [facility] Procedure: 2D Echo, Color Doppler and Cardiac Doppler (Both Spectral and Color            Flow Doppler were utilized during procedure). Indications:    CHF I50.31  History:        Patient has no prior history of Echocardiogram examinations.                 CHF; Risk Factors:Hypertension, Dyslipidemia and Diabetes.  Sonographer:    Tinnie Gosling RDCS Referring Phys: [id number] OLADAPO ADEFESO IMPRESSIONS  1. Left ventricular ejection fraction, by estimation, is 40%. The left ventricle has mildly decreased function. The  left ventricle demonstrates global hypokinesis. There is mild left ventricular hypertrophy. Left ventricular diastolic parameters are indeterminate.  2. Right ventricular systolic function is normal. The right ventricular size is normal. There is mildly elevated pulmonary artery systolic pressure.  3. The mitral valve is abnormal. Mild mitral valve regurgitation. No evidence of mitral stenosis.  4. The tricuspid valve is abnormal.  5. The aortic valve is tricuspid. Aortic valve regurgitation is not visualized. No aortic stenosis is present.  6. The inferior vena cava is normal in size with greater than 50% respiratory variability, suggesting right atrial pressure of 3 mmHg. FINDINGS  Left Ventricle: Left ventricular ejection fraction, by estimation, is 40%. The left ventricle has mildly decreased function. The left ventricle demonstrates global hypokinesis. The left ventricular internal cavity size was normal in size. There is mild left ventricular hypertrophy. Left ventricular diastolic parameters are indeterminate. Right Ventricle: The right ventricular size is normal. Right vetricular wall thickness was not well visualized. Right ventricular systolic function is normal. There is mildly elevated pulmonary artery systolic pressure. The tricuspid regurgitant velocity  is 2.95 m/s, and with an assumed right atrial pressure of 3 mmHg, the estimated right ventricular systolic pressure is 37.8 mmHg. Left Atrium: Left atrial size was normal in size. Right Atrium: Right atrial size was normal in size. Pericardium: There is no evidence of pericardial effusion. Mitral Valve: The mitral valve is abnormal. Mild mitral valve regurgitation. No evidence of mitral valve stenosis. Tricuspid Valve: The tricuspid valve is abnormal. Tricuspid valve regurgitation is mild . No evidence of tricuspid stenosis. Aortic Valve: The aortic valve is tricuspid. Aortic valve regurgitation is not visualized. No aortic stenosis is present. Aortic  valve mean gradient measures 2.5 mmHg. Aortic valve peak gradient measures 4.8 mmHg. Aortic valve area, by VTI measures 1.83 cm. Pulmonic Valve: The pulmonic valve was not well visualized. Pulmonic valve regurgitation is  not visualized. No evidence of pulmonic stenosis. Aorta: The aortic root and ascending aorta are structurally normal, with no evidence of dilitation. Venous: The inferior vena cava is normal in size with greater than 50% respiratory variability, suggesting right atrial pressure of 3 mmHg. IAS/Shunts: No atrial level shunt detected by color flow Doppler.  LEFT VENTRICLE PLAX 2D LVIDd:         4.60 cm      Diastology LVIDs:         3.70 cm      LV e' medial:    4.57 cm/s LV PW:         1.10 cm      LV E/e' medial:  17.9 LV IVS:        1.10 cm      LV e' lateral:   9.36 cm/s LVOT diam:     2.00 cm      LV E/e' lateral: 8.7 LV SV:         38 LV SV Index:   25 LVOT Area:     3.14 cm  LV Volumes (MOD) LV vol d, MOD A2C: 115.0 ml LV vol d, MOD A4C: 82.2 ml LV vol s, MOD A2C: 65.5 ml LV vol s, MOD A4C: 48.4 ml LV SV MOD A2C:     49.5 ml LV SV MOD A4C:     82.2 ml LV SV MOD BP:      42.7 ml RIGHT VENTRICLE            IVC RV S prime:     9.14 cm/s  IVC diam: 1.20 cm TAPSE (M-mode): 1.3 cm                            PULMONARY VEINS                            Diastolic Velocity: 35.70 cm/s                            S/D Velocity:       0.80                            Systolic Velocity:  29.70 cm/s LEFT ATRIUM             Index        RIGHT ATRIUM           Index LA diam:        3.60 cm 2.33 cm/m   RA Area:     11.80 cm LA Vol (A2C):   50.7 ml 32.82 ml/m  RA Volume:   25.60 ml  16.57 ml/m LA Vol (A4C):   46.8 ml 30.29 ml/m LA Biplane Vol: 49.8 ml 32.23 ml/m  AORTIC VALVE AV Area (Vmax):    2.00 cm AV Area (Vmean):   1.85 cm AV Area (VTI):     1.83 cm AV Vmax:           109.08 cm/s AV Vmean:          75.781 cm/s AV VTI:            0.208 m AV Peak Grad:      4.8 mmHg AV Mean Grad:      2.5 mmHg LVOT  Vmax:  69.50 cm/s LVOT Vmean:        44.700 cm/s LVOT VTI:          0.121 m LVOT/AV VTI ratio: 0.58  AORTA Ao Root diam: 2.60 cm Ao Asc diam:  3.20 cm MITRAL VALVE               TRICUSPID VALVE MV Area (PHT): 5.34 cm    TR Peak grad:   34.8 mmHg MV Decel Time: 142 msec    TR Vmax:        295.00 cm/s MV E velocity: 81.80 cm/s MV A velocity: 27.30 cm/s  SHUNTS MV E/A ratio:  3.00        Systemic VTI:  0.12 m                            Systemic Diam: 2.00 cm Dorn Ross MD Electronically signed by Dorn Ross MD Signature Date/Time: 02/20/2024/12:02:17 PM    Final     Cardiac Studies   Patient Profile   Rose Greer is a 68 y.o. female with a hx of HTN, HLD, DM2, brain aneurysm, CVA on Plavix , tobacco use who is being seen 02/20/2024 for the evaluation of HF at the request of Dr. Maree.   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  Incomplete I/Os. weights 102-->98 lbs over 24 hrs. Mild uptrend but still WNL limits yesterday, we did changerher lasix  from bid to just one dose yesterday -SOB 80% better since her report, did have some orthopnea last night. Dose IV lasix  60mg  x 1, reassess tomorrow AM. Likely can transition to oral diuretic tomorrow.     - continue toprol  25mg  daily. Changed irbesartan  to entresto  24/26mg  bid starting tomorrow. Listed allergy to benazepril of confusion appears nonspecific, given LVEF 40% would see if tolerates entresto . Added aldactone  12.5mg  daily - add jardiance  10mg  daily.    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 Off home bp meds x 3 years, presented with SBP in the 190s. I think ischemic testing at this time  lower yield. Would treat medically, repeat LVE 3-6 months, if ongoing dysfunction consider ischemic testing at that time.      2.Elevated troponin - mild and flat in the setting of severe HTN, hypoxia, HF - at this time do not suspect ACS - TWIs on EKG are new since 2021 but in the setting of severe LVH. Given her bp on presentation would suspect poor compliance with bp meds, LVH likely has progressed since 2021 - she does confirm had not been on bp meds for about 3 years, just recently restarted per her report   3. HTN - reports had been off home bp meds for about 3 years, was just restarting prior to admission - bps at goal on toprol , irbesartan .      Prolonged QTc likely due to LVH. Avoid any prolonging agents.    For questions or updates, please contact [facility] Please consult [email or web address] for contact info under       Signed, Ross Dorn, MD  02/22/2024, 8:40 AM

## 2024-02-22 NOTE — Progress Notes (Signed)
 PROGRESS NOTE    Safiatou Islam  FMW:[id number] DOB: 05-01-1955 DOA: 02/19/2024 PCP: Katrinka Aquas, MD   Brief Narrative:    Belia Febo is a 68 y.o. female with medical history significant of hypertension, hyperlipidemia, type 2 diabetes mellitus, brain aneurysm, tobacco abuse who presents to the emergency department due to 1 week onset of shortness of breath that rapidly progressed about 3 days ago and worsened on the morning of admission.  Patient was admitted for acute hypoxemic respiratory failure in the setting of acute on chronic diastolic CHF exacerbation and has been diuresed with IV Lasix  and 2D echocardiogram shows LVEF 40%.  She has been noted to have significant weight loss over the course of the year and TSH noted to be low and further workup pending. She is still volume overloaded.  Assessment & Plan:   Principal Problem:   Acute exacerbation of CHF (congestive heart failure) (HCC)  Assessment and Plan:   Acute hypoxemic respiratory failure secondary to acute on chronic diastolic CHF exacerbation Patient does not wear oxygen at baseline, continue to wean to room air as tolerated proBNP 6,565 Continue total input/output, daily weights and fluid restriction She was diuresed with IV Lasix  which will be continued for one more dose per Cardiology Continue Coreg , Entresto started per cardiology 11/19 Continue heart healthy and carb modified diet           Echocardiogram done in July 2018 showed LVEF of 50% G1 DD.  Echocardiogram with LVEF 40%   Transaminasemia-resolved This is possibly due to hepatic congestion   Type 2 diabetes mellitus with hyperglycemia-improved Continue Semglee 10 units nightly and adjust dose accordingly Continue ISS and hypoglycemia protocol Metformin will be held at this time   Elevated troponin possibly secondary to type II demand ischemia Troponin 22 > 36, patient denies chest pain.  Continue to trend troponin   Prolonged QT interval QTc  523 ms Avoid QT prolonging drugs Magnesium level will be checked Repeat EKG in the morning Compazine discontinued   GERD Continue Protonix    Mixed hyperlipidemia Continue pravastatin    Tobacco abuse Patient was counseled on tobacco abuse cessation   History of CVA Continue Plavix , pravastatin   Significant weight loss Noted to have lost approximately 50 pounds since 05/2023 TSH low and Free T4/T3 ordered    DVT prophylaxis:Lovenox  Code Status: Full Family Communication: None at bedside Disposition Plan:  Status is: Inpatient Remains inpatient appropriate because: Need for IV medications.   Consultants:  Cardiology  Procedures:  None  Antimicrobials:  None   Subjective: Patient seen and evaluated today with minimal dyspnea, but this is ongoing.  No other acute overnight events noted.  Objective: Vitals:   02/22/24 0432 02/22/24 0436 02/22/24 1010 02/22/24 1121  BP: 112/64     Pulse: 70     Resp: 16     Temp: 98.2 F (36.8 C)     TempSrc: Oral     SpO2: 96%  100% 98%  Weight:  44.8 kg    Height:        Intake/Output Summary (Last 24 hours) at 02/22/2024 1123 Last data filed at 02/22/2024 0924 Gross per 24 hour  Intake 960 ml  Output 500 ml  Net 460 ml   Filed Weights   02/20/24 1334 02/21/24 0500 02/22/24 0436  Weight: 46.2 kg 44.7 kg 44.8 kg    Examination:  General exam: Appears calm and comfortable  Respiratory system: Clear to auscultation. Respiratory effort normal.  2 L nasal cannula Cardiovascular system: S1 &  S2 heard, RRR.  Gastrointestinal system: Abdomen is soft Central nervous system: Alert and awake Extremities: No edema Skin: No significant lesions noted Psychiatry: Flat affect.    Data Reviewed: I have personally reviewed following labs and imaging studies  CBC: Recent Labs  Lab 02/19/24 2015 02/20/24 0453 02/21/24 0423  WBC 10.4 8.4 7.7  HGB 12.6 12.2 12.1  HCT 40.3 38.9 37.9  MCV 85.6 84.6 84.8  PLT 188 183  183   Basic Metabolic Panel: Recent Labs  Lab 02/19/24 2015 02/20/24 0453 02/21/24 0423 02/22/24 0428  NA 137 139 137 139  K 4.0 3.9 3.7 3.6  CL 103 101 98 100  CO2 20* 25 26 29   GLUCOSE 178* 98 77 112*  BUN 13 14 20  26*  CREATININE 0.61 0.80 0.88 0.88  CALCIUM  8.7* 8.9 8.8* 9.0  MG 1.8 2.0 2.4 2.3  PHOS  --  3.7  --   --    GFR: Estimated Creatinine Clearance: 42.7 mL/min (by C-G formula based on SCr of 0.88 mg/dL). Liver Function Tests: Recent Labs  Lab 02/19/24 2015 02/20/24 0453  AST 59* 51*  ALT 47* 44  ALKPHOS 105 98  BILITOT 0.7 1.0  PROT 7.4 7.2  ALBUMIN 4.3 4.3   No results for input(s): LIPASE, AMYLASE in the last 168 hours. No results for input(s): AMMONIA in the last 168 hours. Coagulation Profile: No results for input(s): INR, PROTIME in the last 168 hours. Cardiac Enzymes: No results for input(s): CKTOTAL, CKMB, CKMBINDEX, TROPONINI in the last 168 hours. BNP (last 3 results) Recent Labs    02/19/24 2015  PROBNP 6,565.0*   HbA1C: Recent Labs    02/19/24 2015  HGBA1C 5.9*   CBG: Recent Labs  Lab 02/21/24 0740 02/21/24 1103 02/21/24 1614 02/21/24 1956 02/22/24 0726  GLUCAP 98 119* 112* 85 114*   Lipid Profile: Recent Labs    02/21/24 0423  CHOL 221*  HDL 40*  LDLCALC 158*  TRIG 117  CHOLHDL 5.6   Thyroid  Function Tests: Recent Labs    02/22/24 0428  TSH 0.391   Anemia Panel: No results for input(s): VITAMINB12, FOLATE, FERRITIN, TIBC, IRON, RETICCTPCT in the last 72 hours. Sepsis Labs: No results for input(s): PROCALCITON, LATICACIDVEN in the last 168 hours.  No results found for this or any previous visit (from the past 240 hours).       Radiology Studies: No results found.       Scheduled Meds:  bisacodyl   10 mg Rectal Once   calcium  carbonate  1 tablet Oral Q breakfast   empagliflozin   10 mg Oral Daily   enoxaparin  (LOVENOX ) injection  40 mg Subcutaneous Q24H    insulin  aspart  0-5 Units Subcutaneous QHS   insulin  aspart  0-9 Units Subcutaneous TID WC   insulin  glargine-yfgn  10 Units Subcutaneous QHS   metoprolol  succinate  25 mg Oral Daily   pantoprazole   40 mg Oral QAC breakfast   polyethylene glycol  17 g Oral Daily   sacubitril -valsartan   1 tablet Oral BID   spironolactone   12.5 mg Oral Daily      LOS: 3 days    Time spent: 55 minutes    Jammi Morrissette JONETTA Fairly, DO [facility]  If 7PM-7AM, please contact night-coverage [email or web address] 02/22/2024, 11:23 AM

## 2024-02-22 NOTE — Plan of Care (Signed)

## 2024-02-22 NOTE — Progress Notes (Signed)
 Mobility Specialist Progress Note:    02/22/24 1330  Mobility  Activity Ambulated with assistance  Level of Assistance Standby assist, set-up cues, supervision of patient - no hands on  Assistive Device Other (Comment) (dinamap)  Distance Ambulated (ft) 60 ft  Range of Motion/Exercises Active;All extremities  Activity Response Tolerated well  Mobility Referral Yes  Mobility visit 1 Mobility  Mobility Specialist Start Time (ACUTE ONLY) 1330  Mobility Specialist Stop Time (ACUTE ONLY) 1350  Mobility Specialist Time Calculation (min) (ACUTE ONLY) 20 min   Pt received in bed, agreeable to mobility. Required SBA to stand and ambulate using Dinamap for support. Tolerated well, c/o dizziness during ambulation. SpO2 100% at rest and during ambulation on 1L. Returned supine, all needs met.  Ledarius Leeson Mobility Specialist Please contact via Special Educational Needs Teacher or  Rehab office at [phone]

## 2024-02-23 DIAGNOSIS — I5021 Acute systolic (congestive) heart failure: Secondary | ICD-10-CM

## 2024-02-23 DIAGNOSIS — I5033 Acute on chronic diastolic (congestive) heart failure: Secondary | ICD-10-CM | POA: Diagnosis not present

## 2024-02-23 LAB — GLUCOSE, CAPILLARY
Glucose-Capillary: 110 mg/dL — ABNORMAL HIGH (ref 70–99)
Glucose-Capillary: 126 mg/dL — ABNORMAL HIGH (ref 70–99)

## 2024-02-23 LAB — T3, FREE: T3, Free: 2.8 pg/mL (ref 2.0–4.4)

## 2024-02-23 LAB — MAGNESIUM: Magnesium: 2.4 mg/dL (ref 1.7–2.4)

## 2024-02-23 LAB — BASIC METABOLIC PANEL WITH GFR
Anion gap: 9 (ref 5–15)
BUN: 21 mg/dL (ref 8–23)
CO2: 30 mmol/L (ref 22–32)
Calcium: 9.4 mg/dL (ref 8.9–10.3)
Chloride: 97 mmol/L — ABNORMAL LOW (ref 98–111)
Creatinine, Ser: 0.77 mg/dL (ref 0.44–1.00)
GFR, Estimated: >60 mL/min (ref >60)
Glucose, Bld: 94 mg/dL (ref 70–99)
Potassium: 3.6 mmol/L (ref 3.5–5.1)
Sodium: 136 mmol/L (ref 135–145)

## 2024-02-23 MED ORDER — PANTOPRAZOLE SODIUM 40 MG PO TBEC: 40.0000 mg | DELAYED_RELEASE_TABLET | Freq: Every day | ORAL | 5 refills | Status: AC

## 2024-02-23 MED ORDER — SPIRONOLACTONE 25 MG PO TABS: 12.5000 mg | ORAL_TABLET | Freq: Every day | ORAL | 0 refills | Status: DC

## 2024-02-23 MED ORDER — FUROSEMIDE 40 MG PO TABS
40.0000 mg | ORAL_TABLET | Freq: Every day | ORAL | Status: DC
  Administered 2024-02-23: 40 mg via ORAL
  Filled 2024-02-23: qty 1

## 2024-02-23 MED ORDER — SACUBITRIL-VALSARTAN 24-26 MG PO TABS: 1.0000 | ORAL_TABLET | Freq: Two times a day (BID) | ORAL | 2 refills | Status: DC

## 2024-02-23 MED ORDER — GLUCERNA SHAKE PO LIQD: 237.0000 mL | Freq: Three times a day (TID) | ORAL | 0 refills | Status: AC

## 2024-02-23 MED ORDER — FUROSEMIDE 40 MG PO TABS
40.0000 mg | ORAL_TABLET | Freq: Every day | ORAL | 2 refills | Status: AC
Start: 2024-02-23 — End: ?

## 2024-02-23 MED ORDER — EMPAGLIFLOZIN 10 MG PO TABS
10.0000 mg | ORAL_TABLET | Freq: Every day | ORAL | 2 refills | Status: AC
Start: 2024-02-24 — End: ?

## 2024-02-23 NOTE — Discharge Summary (Signed)
 Physician Discharge Summary  Rose Greer FMW:[id number] DOB: 03-29-1956 DOA: 02/19/2024  PCP: Katrinka Aquas, MD  Admit date: 02/19/2024  Discharge date: 02/23/2024  Admitted From: Home  Disposition: Home  Recommendations for Outpatient Follow-up:  Follow up with PCP in 1-2 weeks Needs workup for unintentional weight loss with PCP No significant thyroid  abnormalities noted, patient agreeable to drinking Glucerna at home to help with calorie intake Continue on Toprol -XL 25 mg daily, Lasix  40 mg daily, Entresto  twice daily, Aldactone  12.5 mg daily, and Jardiance  10 mg daily Follow-up with cardiology as scheduled on 12/17  Home Health: With home health RN  Equipment/Devices: None  Discharge Condition:Stable  CODE STATUS: Full  Diet recommendation: Heart Healthy/carb modified  Brief/Interim Summary: Rose Greer is a 68 y.o. female with medical history significant of hypertension, hyperlipidemia, type 2 diabetes mellitus, brain aneurysm, tobacco abuse who presents to the emergency department due to 1 week onset of shortness of breath that rapidly progressed about 3 days ago and worsened on the morning of admission.  Patient was admitted for acute hypoxemic respiratory failure in the setting of acute on chronic diastolic CHF exacerbation and has been diuresed with IV Lasix  and 2D echocardiogram shows LVEF 40%.  She has been noted to have significant weight loss over the course of the year and TSH noted to be borderline low, however free T4 is within normal limits.  She has adequately diuresed and is now euvolemic and stable for discharge.  She has had unintentional weight loss of approximately 50 pounds since February of this year and will require further workup outpatient for this.  I had discussion with her regarding this and she is agreeable to drinking meal supplementation at home.  She will have follow-up with cardiology outpatient as scheduled on 12/17 and medications have been  added/adjusted as noted below.  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  Discharge Instructions  Discharge Instructions     Diet - low sodium heart healthy   Complete by: As directed    Increase activity slowly   Complete by: As directed       Allergies as of 02/23/2024       Reactions   Benazepril Other (See Comments)   confusion   Penicillins Rash   Has patient had a PCN reaction causing immediate rash, facial/tongue/throat swelling, SOB or lightheadedness with hypotension: Yes Has patient had a PCN reaction causing severe rash involving mucus membranes or skin necrosis: No Has patient had a PCN reaction that required hospitalization No Has patient had a PCN reaction occurring within the last 10 years: No If all of the above answers are NO, then may proceed with Cephalosporin use.        Medication List     STOP taking these medications    valsartan  160 MG tablet Commonly known as: DIOVAN        TAKE these medications    albuterol 108 (90 Base) MCG/ACT inhaler Commonly known as: VENTOLIN HFA Inhale 2 puffs into the lungs every 4 (four) hours as needed for wheezing or shortness of breath.   empagliflozin  10 MG Tabs tablet Commonly known as: JARDIANCE  Take 1 tablet (10 mg total) by mouth daily. Start taking on: February 24, 2024   feeding supplement (GLUCERNA SHAKE) Liqd Take 237 mLs by mouth 3 (three) times daily between meals.   furosemide  40  MG tablet Commonly known as: LASIX  Take 1 tablet (40 mg total) by mouth daily.   metoprolol  succinate 25 MG 24 hr tablet Commonly known as: TOPROL -XL Take 25 mg by mouth daily.   pantoprazole  40 MG tablet Commonly known as: PROTONIX  Take 1 tablet (40 mg total) by mouth daily before breakfast.   sacubitril -valsartan  24-26  MG Commonly known as: ENTRESTO  Take 1 tablet by mouth 2 (two) times daily.   spironolactone  25 MG tablet Commonly known as: ALDACTONE  Take 0.5 tablets (12.5 mg total) by mouth daily. Start taking on: February 24, 2024        Follow-up Information     Sheron Lorette [place], [place] Follow up on 03/20/2024.   Specialties: Physician Assistant, Cardiology Why: [facility]iology Hospital Follow-up on 03/20/2024 at 3:15 PM. Contact information: [address] [place] [address] [phone]         Katrinka Aquas, MD. Schedule an appointment as soon as possible for a visit in 1 week(s).   Specialty: Internal Medicine Contact information: [address] [place] [address] [phone]                Allergies  Allergen Reactions   Benazepril Other (See Comments)    confusion   Penicillins Rash    Has patient had a PCN reaction causing immediate rash, facial/tongue/throat swelling, SOB or lightheadedness with hypotension: Yes Has patient had a PCN reaction causing severe rash involving mucus membranes or skin necrosis: No Has patient had a PCN reaction that required hospitalization No Has patient had a PCN reaction occurring within the last 10 years: No If all of the above answers are NO, then may proceed with Cephalosporin use.     Consultations: Cardiology   Procedures/Studies: ECHOCARDIOGRAM COMPLETE Result Date: 02/20/2024    ECHOCARDIOGRAM REPORT   Patient Name:   Rose Greer Date of Exam: 02/20/2024 Medical Rec #:  [id number]    Height:       63.0 in Accession #:    [id number]   Weight:       117.9 lb Date of Birth:  10-04-1955   BSA:          1.545 m Patient Age:    35 years     BP:           146/71 mmHg Patient Gender: F            HR:           62 bpm. Exam Location:  [facility] Procedure: 2D Echo, Color Doppler and Cardiac Doppler (Both Spectral and Color            Flow Doppler were utilized during procedure). Indications:    CHF I50.31  History:         Patient has no prior history of Echocardiogram examinations.                 CHF; Risk Factors:Hypertension, Dyslipidemia and Diabetes.  Sonographer:    Tinnie Gosling RDCS Referring Phys: [id number] OLADAPO ADEFESO IMPRESSIONS  1. Left ventricular ejection fraction, by estimation, is 40%. The left ventricle has mildly decreased function. The left ventricle demonstrates global hypokinesis. There is mild left ventricular hypertrophy. Left ventricular diastolic parameters are indeterminate.  2. Right ventricular systolic function is normal. The right ventricular size is normal. There is mildly elevated pulmonary artery systolic pressure.  3. The mitral valve is abnormal. Mild mitral valve regurgitation. No evidence of mitral stenosis.  4. The tricuspid valve is abnormal.  5. The aortic valve is tricuspid. Aortic valve regurgitation is not visualized. No aortic stenosis is present.  6. The inferior vena cava is normal in size with greater than 50% respiratory variability, suggesting right atrial pressure of 3 mmHg. FINDINGS  Left Ventricle: Left ventricular ejection fraction, by estimation, is 40%. The left ventricle has mildly decreased function. The left ventricle demonstrates global hypokinesis. The left ventricular internal cavity size was normal in size. There is mild left ventricular hypertrophy. Left ventricular diastolic parameters are indeterminate. Right Ventricle: The right ventricular size is normal. Right vetricular wall thickness was not well visualized. Right ventricular systolic function is normal. There is mildly elevated pulmonary artery systolic pressure. The tricuspid regurgitant velocity  is 2.95 m/s, and with an assumed right atrial pressure of 3 mmHg, the estimated right ventricular systolic pressure is 37.8 mmHg. Left Atrium: Left atrial size was normal in size. Right Atrium: Right atrial size was normal in size. Pericardium: There is no evidence of pericardial effusion. Mitral Valve: The mitral  valve is abnormal. Mild mitral valve regurgitation. No evidence of mitral valve stenosis. Tricuspid Valve: The tricuspid valve is abnormal. Tricuspid valve regurgitation is mild . No evidence of tricuspid stenosis. Aortic Valve: The aortic valve is tricuspid. Aortic valve regurgitation is not visualized. No aortic stenosis is present. Aortic valve mean gradient measures 2.5 mmHg. Aortic valve peak gradient measures 4.8 mmHg. Aortic valve area, by VTI measures 1.83 cm. Pulmonic Valve: The pulmonic valve was not well visualized. Pulmonic valve regurgitation is not visualized. No evidence of pulmonic stenosis. Aorta: The aortic root and ascending aorta are structurally normal, with no evidence of dilitation. Venous: The inferior vena cava is normal in size with greater than 50% respiratory variability, suggesting right atrial pressure of 3 mmHg. IAS/Shunts: No atrial level shunt detected by color flow Doppler.  LEFT VENTRICLE PLAX 2D LVIDd:         4.60 cm      Diastology LVIDs:         3.70 cm      LV e' medial:    4.57 cm/s LV PW:         1.10 cm      LV E/e' medial:  17.9 LV IVS:        1.10 cm      LV e' lateral:   9.36 cm/s LVOT diam:     2.00 cm      LV E/e' lateral: 8.7 LV SV:         38 LV SV Index:   25 LVOT Area:     3.14 cm  LV Volumes (MOD) LV vol d, MOD A2C: 115.0 ml LV vol d, MOD A4C: 82.2 ml LV vol s, MOD A2C: 65.5 ml LV vol s, MOD A4C: 48.4 ml LV SV MOD A2C:     49.5 ml LV SV MOD A4C:     82.2 ml LV SV MOD BP:      42.7 ml RIGHT VENTRICLE            IVC RV S prime:     9.14 cm/s  IVC diam: 1.20 cm TAPSE (M-mode): 1.3 cm                            PULMONARY VEINS                            Diastolic Velocity: 35.70  cm/s                            S/D Velocity:       0.80                            Systolic Velocity:  29.70 cm/s LEFT ATRIUM             Index        RIGHT ATRIUM           Index LA diam:        3.60 cm 2.33 cm/m   RA Area:     11.80 cm LA Vol (A2C):   50.7 ml 32.82 ml/m  RA Volume:    25.60 ml  16.57 ml/m LA Vol (A4C):   46.8 ml 30.29 ml/m LA Biplane Vol: 49.8 ml 32.23 ml/m  AORTIC VALVE AV Area (Vmax):    2.00 cm AV Area (Vmean):   1.85 cm AV Area (VTI):     1.83 cm AV Vmax:           109.08 cm/s AV Vmean:          75.781 cm/s AV VTI:            0.208 m AV Peak Grad:      4.8 mmHg AV Mean Grad:      2.5 mmHg LVOT Vmax:         69.50 cm/s LVOT Vmean:        44.700 cm/s LVOT VTI:          0.121 m LVOT/AV VTI ratio: 0.58  AORTA Ao Root diam: 2.60 cm Ao Asc diam:  3.20 cm MITRAL VALVE               TRICUSPID VALVE MV Area (PHT): 5.34 cm    TR Peak grad:   34.8 mmHg MV Decel Time: 142 msec    TR Vmax:        295.00 cm/s MV E velocity: 81.80 cm/s MV A velocity: 27.30 cm/s  SHUNTS MV E/A ratio:  3.00        Systemic VTI:  0.12 m                            Systemic Diam: 2.00 cm Dorn Ross MD Electronically signed by Dorn Ross MD Signature Date/Time: 02/20/2024/12:02:17 PM    Final    DG Chest Portable 1 View Result Date: 02/19/2024 CLINICAL DATA:  Shortness of breath. EXAM: PORTABLE CHEST 1 VIEW COMPARISON:  December 14, 2019 FINDINGS: The heart size and mediastinal contours are within normal limits. There is moderate severity calcification of the aortic arch. Moderate severity diffusely increased interstitial lung markings are seen. Mild atelectasis and/or infiltrate is also noted within the retrocardiac region of the left lung base. No pleural effusion or pneumothorax is identified. The visualized skeletal structures are unremarkable. IMPRESSION: 1. Moderate severity interstitial edema. 2. Mild left basilar atelectasis and/or infiltrate. Electronically Signed   By: Suzen Dials M.D.   On: 02/19/2024 21:22     Discharge Exam: Vitals:   02/23/24 0443 02/23/24 0906  BP: 110/67 123/77  Pulse: 72 67  Resp: 18   Temp: 98.1 F (36.7 C)   SpO2: 99% 98%   Vitals:   02/22/24 2035 02/23/24 0443 02/23/24 0500 02/23/24 0906  BP: (!) 106/50 110/67  123/77  Pulse: 64 72   67  Resp: 18 18    Temp: 98.1 F (36.7 C) 98.1 F (36.7 C)    TempSrc:      SpO2: 98% 99%  98%  Weight:   44.5 kg   Height:        General: Pt is alert, awake, not in acute distress Cardiovascular: RRR, S1/S2 +, no rubs, no gallops Respiratory: CTA bilaterally, no wheezing, no rhonchi Abdominal: Soft, NT, ND, bowel sounds + Extremities: no edema, no cyanosis    The results of significant diagnostics from this hospitalization (including imaging, microbiology, ancillary and laboratory) are listed below for reference.     Microbiology: No results found for this or any previous visit (from the past 240 hours).   Labs: BNP (last 3 results) No results for input(s): BNP in the last 8760 hours. Basic Metabolic Panel: Recent Labs  Lab 02/19/24 2015 02/20/24 0453 02/21/24 0423 02/22/24 0428 02/23/24 0441  NA 137 139 137 139 136  K 4.0 3.9 3.7 3.6 3.6  CL 103 101 98 100 97*  CO2 20* 25 26 29 30   GLUCOSE 178* 98 77 112* 94  BUN 13 14 20  26* 21  CREATININE 0.61 0.80 0.88 0.88 0.77  CALCIUM  8.7* 8.9 8.8* 9.0 9.4  MG 1.8 2.0 2.4 2.3 2.4  PHOS  --  3.7  --   --   --    Liver Function Tests: Recent Labs  Lab 02/19/24 2015 02/20/24 0453  AST 59* 51*  ALT 47* 44  ALKPHOS 105 98  BILITOT 0.7 1.0  PROT 7.4 7.2  ALBUMIN 4.3 4.3   No results for input(s): LIPASE, AMYLASE in the last 168 hours. No results for input(s): AMMONIA in the last 168 hours. CBC: Recent Labs  Lab 02/19/24 2015 02/20/24 0453 02/21/24 0423  WBC 10.4 8.4 7.7  HGB 12.6 12.2 12.1  HCT 40.3 38.9 37.9  MCV 85.6 84.6 84.8  PLT 188 183 183   Cardiac Enzymes: No results for input(s): CKTOTAL, CKMB, CKMBINDEX, TROPONINI in the last 168 hours. BNP: Invalid input(s): POCBNP CBG: Recent Labs  Lab 02/22/24 0726 02/22/24 1122 02/22/24 1620 02/22/24 2033 02/23/24 0737  GLUCAP 114* 230* 128* 191* 110*   D-Dimer No results for input(s): DDIMER in the last 72 hours. Hgb A1c No  results for input(s): HGBA1C in the last 72 hours. Lipid Profile Recent Labs    02/21/24 0423  CHOL 221*  HDL 40*  LDLCALC 158*  TRIG 117  CHOLHDL 5.6   Thyroid  function studies Recent Labs    02/22/24 0428 02/22/24 0821  TSH 0.391  --   T3FREE  --  2.8   Anemia work up No results for input(s): VITAMINB12, FOLATE, FERRITIN, TIBC, IRON, RETICCTPCT in the last 72 hours. Urinalysis    Component Value Date/Time   COLORURINE STRAW (A) 10/15/2016 1105   APPEARANCEUR CLEAR 10/15/2016 1105   LABSPEC 1.009 10/15/2016 1105   PHURINE 8.0 10/15/2016 1105   GLUCOSEU >=500 (A) 10/15/2016 1105   HGBUR NEGATIVE 10/15/2016 1105   BILIRUBINUR NEGATIVE 10/15/2016 1105   KETONESUR NEGATIVE 10/15/2016 1105   PROTEINUR NEGATIVE 10/15/2016 1105   NITRITE NEGATIVE 10/15/2016 1105   LEUKOCYTESUR NEGATIVE 10/15/2016 1105   Sepsis Labs Recent Labs  Lab 02/19/24 2015 02/20/24 0453 02/21/24 0423  WBC 10.4 8.4 7.7   Microbiology No results found for this or any previous visit (from the past 240 hours).   Time coordinating discharge: 35 minutes  SIGNED:  Dawne Casali D Maree, DO [facility] 02/23/2024, 10:18 AM  If 7PM-7AM, please contact night-coverage [email or web address]

## 2024-02-23 NOTE — Care Management Important Message (Signed)
 Important Message  Patient Details  Name: Rose Greer MRN: [id number] Date of Birth: 05-26-1955   Important Message Given:  Yes - Medicare IM     Breon Diss L Edoardo Laforte 02/23/2024, 10:57 AM

## 2024-02-23 NOTE — Progress Notes (Signed)
 Patient is being discharged home. Patient verbalized a full understanding of all discharge instructions including medications. Patient is being provided transportation home.

## 2024-02-23 NOTE — Progress Notes (Signed)
 Rounding Note   Patient Name: Rose Greer Date of Encounter: 02/23/2024  [facility] Cardiologist: New  Subjective SOB resolved  Scheduled Meds:  calcium  carbonate  1 tablet Oral Q breakfast   empagliflozin   10 mg Oral Daily   enoxaparin  (LOVENOX ) injection  40 mg Subcutaneous Q24H   feeding supplement (GLUCERNA SHAKE)  237 mL Oral TID BM   insulin  aspart  0-5 Units Subcutaneous QHS   insulin  aspart  0-9 Units Subcutaneous TID WC   insulin  glargine-yfgn  10 Units Subcutaneous QHS   metoprolol  succinate  25 mg Oral Daily   pantoprazole   40 mg Oral QAC breakfast   polyethylene glycol  17 g Oral Daily   sacubitril -valsartan   1 tablet Oral BID   spironolactone   12.5 mg Oral Daily   Continuous Infusions:  PRN Meds: acetaminophen  **OR** acetaminophen , mouth rinse   Vital Signs  Vitals:   02/22/24 1738 02/22/24 2035 02/23/24 0443 02/23/24 0500  BP:  (!) 106/50 110/67   Pulse:  64 72   Resp:  18 18   Temp:  98.1 F (36.7 C) 98.1 F (36.7 C)   TempSrc:      SpO2: 98% 98% 99%   Weight:    44.5 kg  Height:        Intake/Output Summary (Last 24 hours) at 02/23/2024 0842 Last data filed at 02/23/2024 0500 Gross per 24 hour  Intake 720 ml  Output 1400 ml  Net -680 ml      02/23/2024    5:00 AM 02/22/2024    4:36 AM 02/21/2024    5:00 AM  Last 3 Weights  Weight (lbs) 98 lb 1.7 oz 98 lb 12.3 oz 98 lb 8.7 oz  Weight (kg) 44.5 kg 44.8 kg 44.7 kg      Telemetry NSR - Personally Reviewed  ECG  N/a - Personally Reviewed  Physical Exam  GEN: No acute distress.   Neck: No JVD Cardiac: RRR, no murmurs, rubs, or gallops.  Respiratory: Clear to auscultation bilaterally. GI: Soft, nontender, non-distended  MS: No edema; No deformity. Neuro:  Nonfocal  Psych: Normal affect   Labs High Sensitivity Troponin:  No results for input(s): TROPONINIHS in the last 720 hours.   Chemistry Recent Labs  Lab 02/19/24 2015 02/20/24 0453 02/21/24 0423  02/22/24 0428 02/23/24 0441  NA 137 139 137 139 136  K 4.0 3.9 3.7 3.6 3.6  CL 103 101 98 100 97*  CO2 20* 25 26 29 30   GLUCOSE 178* 98 77 112* 94  BUN 13 14 20  26* 21  CREATININE 0.61 0.80 0.88 0.88 0.77  CALCIUM  8.7* 8.9 8.8* 9.0 9.4  MG 1.8 2.0 2.4 2.3 2.4  PROT 7.4 7.2  --   --   --   ALBUMIN 4.3 4.3  --   --   --   AST 59* 51*  --   --   --   ALT 47* 44  --   --   --   ALKPHOS 105 98  --   --   --   BILITOT 0.7 1.0  --   --   --   GFRNONAA >60 >60 >60 >60 >60  ANIONGAP 14 13 13 10 9     Lipids  Recent Labs  Lab 02/21/24 0423  CHOL 221*  TRIG 117  HDL 40*  LDLCALC 158*  CHOLHDL 5.6    Hematology Recent Labs  Lab 02/19/24 2015 02/20/24 0453 02/21/24 0423  WBC 10.4 8.4 7.7  RBC  4.71 4.60 4.47  HGB 12.6 12.2 12.1  HCT 40.3 38.9 37.9  MCV 85.6 84.6 84.8  MCH 26.8 26.5 27.1  MCHC 31.3 31.4 31.9  RDW 15.7* 15.6* 15.3  PLT 188 183 183   Thyroid   Recent Labs  Lab 02/22/24 0428 02/22/24 0821  TSH 0.391  --   FREET4  --  0.94    BNP Recent Labs  Lab 02/19/24 2015  PROBNP 6,565.0*    DDimer No results for input(s): DDIMER in the last 168 hours.   Radiology  No results found.   Patient Profile   Rose Greer is a 68 y.o. female with a hx of HTN, HLD, DM2, brain aneurysm, CVA on Plavix , tobacco use who is being seen 02/20/2024 for the evaluation of HF at the request of Dr. Maree.   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  Incomplete I/Os. Weight stable over 24 hrs. . Mild variation in Cr without clear trend.   - appers euvolemic, start lasix  40mg  daily.      - continue toprol  25mg  daily. Changed irbesartan  to entresto  24/26mg  bid starting tomorrow.  Listed allergy to benazepril of confusion appears nonspecific, given LVEF 40% would see if tolerates entresto . Added aldactone  12.5mg  daily - added jardiance  10mg  daily.    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 Off home bp meds x 3 years, presented with SBP in the 190s. I think ischemic testing at this time lower yield. Would treat medically, repeat LVE 3-6 months, if ongoing dysfunction consider ischemic testing at that time.      2.Elevated troponin - mild and flat in the setting of severe HTN, hypoxia, HF - at this time do not suspect ACS - TWIs on EKG are new since 2021 but in the setting of severe LVH. Given her bp on presentation would suspect poor compliance with bp meds, LVH likely has progressed since 2021 - she does confirm had not been on bp meds for about 3 years, just recently restarted per her report   3. HTN - reports had been off home bp meds for about 3 years, was just restarting prior to admission - bps at goal on toprol , irbesartan .      Prolonged QTc likely due to LVH. Avoid any prolonging agents.   Ok for discharge from a cardiac standpoint,we will arrange outpatient f/u.    For questions or updates, please contact [facility] Please consult [email or web address] for contact info under       Signed, Alvan Carrier, MD  02/23/2024, 8:42 AM

## 2024-02-23 NOTE — TOC Transition Note (Addendum)
 Transition of Care [facility]) - Discharge Note   Patient Details  Name: Rose Greer MRN: [id number] Date of Birth: Nov 27, 1955  Transition of [facility]) CM/SW Contact:  Lucie Lunger, LCSWA Phone Number: 02/23/2024, 11:46 AM   Clinical Narrative:    CSW updated that MD would like pt to have [facility] RN for CHF management in the home. CSW met with pt at bedside to review. Pt states that she is agreeable and does not have an agency preference. CSW spoke to Artavia with [facility] who states they can accept [facility] RN referral. MD placed HH orders. TOC signing off.   Final next level of care: Home w Home Health Services Barriers to Discharge: Barriers Resolved   Patient Goals and CMS Choice Patient states their goals for this hospitalization and ongoing recovery are:: From home CMS Medicare.gov Compare Post Acute Care list provided to:: Patient Choice offered to / list presented to : Patient      Discharge Placement                       Discharge Plan and Services Additional resources added to the After Visit Summary for                            [facility] Arranged: RN [facility] Agency: [facility] Date [facility] Agency Contacted: 02/23/24   Representative spoke with at [facility] Agency: [facility]  Social Drivers of Health (SDOH) Interventions SDOH Screenings   Food Insecurity: Patient Declined (02/20/2024)  Housing: Unknown (02/20/2024)  Transportation Needs: No Transportation Needs (02/20/2024)  Utilities: Not At Risk (02/20/2024)  Social Connections: Patient Declined (02/20/2024)  Tobacco Use: High Risk (02/19/2024)     Readmission Risk Interventions     No data to display

## 2024-03-19 NOTE — Progress Notes (Addendum)
 " Cardiology Office Note:  .   Date:  03/20/2024  ID:  Rose Greer, DOB 1956/02/12, MRN [id number] PCP: Katrinka Aquas, MD  [facility] Providers Cardiologist:  Alvan Carrier, MD {  History of Present Illness: .   Rose Greer is a 68 y.o. female  with PMHx of prior admissions for HTN urgency with pulmonary edema in 2018, HTN, HLD, DM2, brain aneurysm, CVA on Plavix , tobacco use who reports to [facility] office for hospital follow up related to new acute HFmrEF.   Pertinent cardiac medical history:  New HFmrEF  07/2016 echo: LVEF 50%, mild LVH, grade I dd, mild LAE  02/2024 echo LVEF 40%, mild LVH, indet diastolic function, normal RV function, mild MR  Other relevant cardiac studies Exercise myoview  to Lexiscan  2018: testing converted due to hypertensive response to exercise, low risk, EF 51%  Last seen in heartcare by Dr. Debera in 2025 for management of hypertensive hear disease and has been lost to follow up since then.   Recent hospitalization 02/2024 with heartcare consult for evaluation of acute HF with proBNP 6565.  Reported worsening SOB X 3 days with very minimal exertion, edema in feet, abdominal distention and chronic unchanged PND.  EKG noted new TW's in the setting of severe LVH.  CXR showed moderate edema.  ECHO 02/2024 as above.  Noted off home BP meds X 3 years and recently just restarted prior to admission.  Noted 190s/110s on initial presentation. 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  Treated with IV Lasix  60 mg. 96 lbs on discharge. Started and discharged on Lasix  40 mg daily.  Switched Irbesartan  to Entresto  24/26 mg twice daily.  Added spironolactone  12.5 mg daily and Jardiance  10 mg daily.  Today, report LE edema resolved and DOE has improved but it is still present occasionally with house cleaning and standing in shower.  Reports unchanged chronic PND using 2-3 pillows and orthopnea. Her weight has been gradually increasing since discharged but has  remained stable between 103-106 lbs recently. Reports compliance with medications. Since starting 4 GDMT medication, she notices dizziness and Iightheadness shortly after taking medication. She reports home BP usually 120-130's/60-70's. Denies chest pain, palpitations, syncope, presyncope, acute bleeding, or claudication.  She cooks most of her meals. She is able to do house cleaning and from home to mailbox, however she occasionally has to take breath for rest due to SOB.  She smoke approx 1/4 PPD. Denies alcohol/drug use.   ROS: 10 point review of system has been reviewed and considered negative except ones been listed in the HPI.   Studies Reviewed: SABRA   Exercise Myoview  to Lexiscan  2018 No diagnostic ST segment changes to indicate ischemia. Exercise testing converted to Lexiscan  due to significant hypertensive response. Blood pressure demonstrated a hypertensive response to exercise. Small, mild intensity, partially reversible anteroapical defect that most likely represents variable soft tissue attenuation, less likely small ischemic zone. This is a low risk study. Nuclear stress EF: 51%.   ECHO 02/20/2024 IMPRESSIONS   1. Left ventricular ejection fraction, by estimation, is 40%. The left  ventricle has mildly decreased function. The left ventricle demonstrates  global hypokinesis. There is mild left ventricular hypertrophy. Left  ventricular diastolic parameters are indeterminate.   2. Right ventricular systolic function is normal. The right ventricular  size is normal. There is mildly elevated pulmonary artery systolic  pressure.   3. The mitral valve is abnormal. Mild mitral valve regurgitation. No  evidence of mitral stenosis.   4. The tricuspid  valve is abnormal.   5. The aortic valve is tricuspid. Aortic valve regurgitation is not  visualized. No aortic stenosis is present.   6. The inferior vena cava is normal in size with greater than 50%  respiratory variability, suggesting  right atrial pressure of 3 mmHg.    Physical Exam:   VS:  BP (!) 104/50 (BP Location: Right Arm, Cuff Size: Normal)   Pulse 65   Ht 5' 3 (1.6 m)   Wt 107 lb (48.5 kg)   SpO2 96%   BMI 18.95 kg/m    Wt Readings from Last 3 Encounters:  03/20/24 107 lb (48.5 kg)  02/23/24 98 lb 1.7 oz (44.5 kg)  02/03/20 118 lb 4.8 oz (53.7 kg)    GEN: Well nourished, well developed in no acute distress while sitting in chair.  NECK: No JVD; No carotid bruits CARDIAC: RRR, no murmurs, rubs, gallops RESPIRATORY:  Clear to auscultation without rales, wheezing or rhonchi  ABDOMEN: Soft, non-tender, non-distended EXTREMITIES:  No edema; No deformity   ASSESSMENT AND PLAN: .   Addendum 05/09/2024: Reviewed scanned labs from PCP 04/2024: TSH/T4, vitamin D, CMP WNL with a K3.6 and CR 0.95, FLP WNL with LDL 66, CBC WNL with mildly low MCH/MCHC and mildly elevated MPV.  Heart failure with mildly reduced ejection fraction (HFmrEF, 41-49%) (HCC) Reports that LE edema has resolved. Some improvement in DOE but still present. Notes unchanged PND and orthopnea.  Also home weight has been stable 103-106 lbs at home (vs 96 lbs on discharge in 02/2024 vs 107 lbs today). Appears Euvolemic on exam.  Decrease Toprol -XL from 25 to 12.5 mg daily due to lightheadedness and dizziness. Continue on Jardiance  10 mg daily, Entresto  24/26 mg twice daily, spironolactone  12.5 mg daily, Lasix  40 mg daily Order CMP and proBNP. Will plan to repeat echo in 3-6 months. If ongoing dysfunction then consider ischemic testing.  Encouraged low sodium diet, fluid restriction <2L, and daily weights.  Educated to contact our office for weight gain of 2 lbs overnight or 5 lbs in one week. ED precautions discussed.       Latest Ref Rng & Units 02/23/2024    4:41 AM 02/22/2024    4:28 AM 02/21/2024    4:23 AM  BMP  Glucose 70 - 99 mg/dL 94  887  77   BUN 8 - 23 mg/dL 21  26  20    Creatinine 0.44 - 1.00 mg/dL 9.22  9.11  9.11   Sodium 135 -  145 mmol/L 136  139  137   Potassium 3.5 - 5.1 mmol/L 3.6  3.6  3.7   Chloride 98 - 111 mmol/L 97  100  98   CO2 22 - 32 mmol/L 30  29  26    Calcium  8.9 - 10.3 mg/dL 9.4  9.0  8.8    Primary hypertension Hypertensive emergency Since starting 4 GDMT medication, she notices dizziness and Iightheadness shortly after taking medication. Suspect secondary to soft BP. Will adjust GDMT as above.  Reports well controlled Home SBP 120-130's.   BP this OV on soft side: 104/50 with repeat BP 102/60 Managed by GDMT as above. Encourage physical activity for 150 minutes per week and heart healthy low sodium diet. Discussed limiting sodium intake to < 2 grams daily.     Hyperlipidemia LDL goal <100 Per chart review, patient was previously on Pravastatin  and Lipitor  but d/c for unknown reason. Patient denies any known muscle aches with statins.  LDL not at goal.  Order Lipitor  20 mg daily.  Will plan to reassess in 6-8 weeks. Will order FLP at next follow up.   Lipid Panel     Component Value Date/Time   CHOL 221 (H) 02/21/2024 0423   TRIG 117 02/21/2024 0423   HDL 40 (L) 02/21/2024 0423   CHOLHDL 5.6 02/21/2024 0423   VLDL 23 02/21/2024 0423   LDLCALC 158 (H) 02/21/2024 0423     Prolonged Q-T interval on ECG Most likely due to LVH.  Avoid any QT prolonging agents.   Tobacco use  Currently smokes <1/4 PPD  Dicussed cessation and patient is working towards cessation with reduction.    Dispo: Follow up in 6-8 weeks.   Signed, Lorette CINDERELLA Kapur, PA-C  "

## 2024-03-20 ENCOUNTER — Ambulatory Visit: Attending: Physician Assistant | Admitting: Physician Assistant

## 2024-03-20 ENCOUNTER — Encounter: Payer: Self-pay | Admitting: Physician Assistant

## 2024-03-20 VITALS — BP 104/50 | HR 65 | Ht 63.0 in | Wt 107.0 lb

## 2024-03-20 DIAGNOSIS — I10 Essential (primary) hypertension: Secondary | ICD-10-CM | POA: Diagnosis not present

## 2024-03-20 DIAGNOSIS — E785 Hyperlipidemia, unspecified: Secondary | ICD-10-CM

## 2024-03-20 DIAGNOSIS — R9431 Abnormal electrocardiogram [ECG] [EKG]: Secondary | ICD-10-CM

## 2024-03-20 DIAGNOSIS — I161 Hypertensive emergency: Secondary | ICD-10-CM

## 2024-03-20 DIAGNOSIS — Z720 Tobacco use: Secondary | ICD-10-CM | POA: Diagnosis not present

## 2024-03-20 DIAGNOSIS — I5020 Unspecified systolic (congestive) heart failure: Secondary | ICD-10-CM | POA: Diagnosis not present

## 2024-03-20 MED ORDER — FUROSEMIDE 40 MG PO TABS: 40.0000 mg | ORAL_TABLET | Freq: Every day | ORAL | 2 refills | Status: AC

## 2024-03-20 MED ORDER — SPIRONOLACTONE 25 MG PO TABS: 12.5000 mg | ORAL_TABLET | Freq: Every day | ORAL | 2 refills | Status: AC

## 2024-03-20 MED ORDER — SACUBITRIL-VALSARTAN 24-26 MG PO TABS: 1.0000 | ORAL_TABLET | Freq: Two times a day (BID) | ORAL | 2 refills | Status: DC

## 2024-03-20 MED ORDER — METOPROLOL SUCCINATE ER 25 MG PO TB24: 12.5000 mg | ORAL_TABLET | Freq: Every day | ORAL | 3 refills | Status: AC

## 2024-03-20 MED ORDER — EMPAGLIFLOZIN 10 MG PO TABS: 10.0000 mg | ORAL_TABLET | Freq: Every day | ORAL | 2 refills | Status: AC

## 2024-03-20 NOTE — Patient Instructions (Signed)
 Medication Instructions:   START Atorvastatin  20 mg daily for cholesterol   DECREASE Metoprolol  to 12.5 mg daily ( take 1/2 tablet)   Labwork: CMP,  BNP at [facility]. Check in at the main entrance of the hospital. No appointment needed, the lab is open Monday-Friday 8 am to 3:45 pm  Testing/Procedures: None today  Follow-Up: 6-8 weeks   Any Other Special Instructions Will Be Listed Below (If Applicable).  If you need a refill on your cardiac medications before your next appointment, please call your pharmacy.

## 2024-04-30 ENCOUNTER — Telehealth: Payer: Self-pay | Admitting: Cardiology

## 2024-04-30 MED ORDER — SACUBITRIL-VALSARTAN 24-26 MG PO TABS: 1.0000 | ORAL_TABLET | Freq: Two times a day (BID) | ORAL | 2 refills | Status: AC

## 2024-04-30 NOTE — Telephone Encounter (Signed)
" °*  STAT* If patient is at the pharmacy, call can be transferred to refill team.   1. Which medications need to be refilled? (please list name of each medication and dose if known)   sacubitril -valsartan  (ENTRESTO ) 24-26 MG    2. Would you like to learn more about the convenience, safety, & potential cost savings by using the [facility]?  3. Are you open to using the [facility] (Type [facility]. ).  4. Which pharmacy/location (including street and city if local pharmacy) is medication to be sent to?  [facility] - [place], [place] - [address]   5. Do they need a 30 day or 90 day supply?    Patient stated she is almost out of this medication.  Patient has appointment scheduled on 2/9 with CANDIE Kapur, PA   "

## 2024-04-30 NOTE — Telephone Encounter (Signed)
 Refill sent

## 2024-05-02 ENCOUNTER — Ambulatory Visit: Admitting: Cardiology

## 2024-05-09 ENCOUNTER — Encounter: Payer: Self-pay | Admitting: Family Medicine

## 2024-05-09 ENCOUNTER — Ambulatory Visit: Payer: Self-pay | Admitting: Physician Assistant

## 2024-05-13 ENCOUNTER — Ambulatory Visit: Admitting: Physician Assistant
